# Patient Record
Sex: Female | Born: 1945 | Race: White | Hispanic: No | Marital: Single | Smoking: Never smoker
Health system: Southern US, Community
[De-identification: ages and names within clinical notes are randomized; demographics above are authoritative.]

## PROBLEM LIST (undated history)

## (undated) DIAGNOSIS — I10 Essential (primary) hypertension: Secondary | ICD-10-CM

## (undated) DIAGNOSIS — J189 Pneumonia, unspecified organism: Secondary | ICD-10-CM

## (undated) DIAGNOSIS — I482 Chronic atrial fibrillation, unspecified: Secondary | ICD-10-CM

## (undated) DIAGNOSIS — Q909 Down syndrome, unspecified: Secondary | ICD-10-CM

## (undated) DIAGNOSIS — I519 Heart disease, unspecified: Secondary | ICD-10-CM

## (undated) DIAGNOSIS — I2699 Other pulmonary embolism without acute cor pulmonale: Secondary | ICD-10-CM

## (undated) DIAGNOSIS — S8290XA Unspecified fracture of unspecified lower leg, initial encounter for closed fracture: Secondary | ICD-10-CM

## (undated) DIAGNOSIS — I272 Pulmonary hypertension, unspecified: Secondary | ICD-10-CM

## (undated) HISTORY — DX: Chronic atrial fibrillation, unspecified: I48.20

## (undated) HISTORY — DX: Heart disease, unspecified: I51.9

## (undated) HISTORY — DX: Essential (primary) hypertension: I10

---

## 2007-05-02 ENCOUNTER — Ambulatory Visit (HOSPITAL_COMMUNITY): Admission: RE | Admit: 2007-05-02 | Discharge: 2007-05-02 | Payer: Self-pay | Admitting: Family Medicine

## 2008-06-06 ENCOUNTER — Ambulatory Visit (HOSPITAL_COMMUNITY): Admission: RE | Admit: 2008-06-06 | Discharge: 2008-06-06 | Payer: Self-pay | Admitting: Family Medicine

## 2008-07-16 ENCOUNTER — Emergency Department (HOSPITAL_COMMUNITY): Admission: EM | Admit: 2008-07-16 | Discharge: 2008-07-16 | Payer: Self-pay | Admitting: Emergency Medicine

## 2008-07-16 ENCOUNTER — Encounter: Payer: Self-pay | Admitting: Orthopedic Surgery

## 2008-07-17 ENCOUNTER — Ambulatory Visit: Payer: Self-pay | Admitting: Orthopedic Surgery

## 2008-07-17 DIAGNOSIS — S82109A Unspecified fracture of upper end of unspecified tibia, initial encounter for closed fracture: Secondary | ICD-10-CM

## 2008-07-19 ENCOUNTER — Encounter: Payer: Self-pay | Admitting: Orthopedic Surgery

## 2008-08-01 ENCOUNTER — Ambulatory Visit: Payer: Self-pay | Admitting: Orthopedic Surgery

## 2008-08-29 ENCOUNTER — Ambulatory Visit: Payer: Self-pay | Admitting: Orthopedic Surgery

## 2008-10-10 ENCOUNTER — Ambulatory Visit: Payer: Self-pay | Admitting: Orthopedic Surgery

## 2008-10-23 ENCOUNTER — Telehealth: Payer: Self-pay | Admitting: Orthopedic Surgery

## 2008-11-07 ENCOUNTER — Encounter: Payer: Self-pay | Admitting: Orthopedic Surgery

## 2008-11-14 ENCOUNTER — Encounter: Payer: Self-pay | Admitting: Orthopedic Surgery

## 2008-11-22 ENCOUNTER — Encounter: Payer: Self-pay | Admitting: Orthopedic Surgery

## 2008-12-05 ENCOUNTER — Encounter: Payer: Self-pay | Admitting: Orthopedic Surgery

## 2008-12-10 ENCOUNTER — Encounter: Payer: Self-pay | Admitting: Orthopedic Surgery

## 2009-01-10 ENCOUNTER — Ambulatory Visit: Payer: Self-pay | Admitting: Orthopedic Surgery

## 2009-01-28 ENCOUNTER — Encounter: Payer: Self-pay | Admitting: Orthopedic Surgery

## 2009-02-01 ENCOUNTER — Encounter: Payer: Self-pay | Admitting: Orthopedic Surgery

## 2009-06-18 ENCOUNTER — Ambulatory Visit (HOSPITAL_COMMUNITY): Admission: RE | Admit: 2009-06-18 | Discharge: 2009-06-18 | Payer: Self-pay | Admitting: Family Medicine

## 2009-07-26 ENCOUNTER — Emergency Department (HOSPITAL_COMMUNITY): Admission: EM | Admit: 2009-07-26 | Discharge: 2009-07-26 | Payer: Self-pay | Admitting: Emergency Medicine

## 2009-07-28 ENCOUNTER — Emergency Department (HOSPITAL_COMMUNITY): Admission: EM | Admit: 2009-07-28 | Discharge: 2009-07-28 | Payer: Self-pay | Admitting: Emergency Medicine

## 2009-07-30 ENCOUNTER — Encounter: Payer: Self-pay | Admitting: Emergency Medicine

## 2009-07-31 ENCOUNTER — Inpatient Hospital Stay (HOSPITAL_COMMUNITY): Admission: EM | Admit: 2009-07-31 | Discharge: 2009-08-12 | Payer: Self-pay | Admitting: Internal Medicine

## 2009-07-31 ENCOUNTER — Encounter (INDEPENDENT_AMBULATORY_CARE_PROVIDER_SITE_OTHER): Payer: Self-pay | Admitting: Internal Medicine

## 2009-08-01 ENCOUNTER — Ambulatory Visit: Payer: Self-pay | Admitting: Vascular Surgery

## 2009-08-01 ENCOUNTER — Encounter (INDEPENDENT_AMBULATORY_CARE_PROVIDER_SITE_OTHER): Payer: Self-pay | Admitting: Cardiology

## 2010-06-27 ENCOUNTER — Ambulatory Visit (HOSPITAL_COMMUNITY): Admission: RE | Admit: 2010-06-27 | Discharge: 2010-06-27 | Payer: Self-pay | Admitting: Family Medicine

## 2011-02-27 LAB — COMPREHENSIVE METABOLIC PANEL
ALT: 80 U/L — ABNORMAL HIGH (ref 0–35)
ALT: 75 U/L — ABNORMAL HIGH (ref 0–35)
AST: 61 U/L — ABNORMAL HIGH (ref 0–37)
AST: 68 U/L — ABNORMAL HIGH (ref 0–37)
CO2: 23 mEq/L (ref 19–32)
CO2: 25 mEq/L (ref 19–32)
Calcium: 8.9 mg/dL (ref 8.4–10.5)
Calcium: 9.4 mg/dL (ref 8.4–10.5)
Calcium: 8.7 mg/dL (ref 8.4–10.5)
Creatinine, Ser: 0.7 mg/dL (ref 0.4–1.2)
Creatinine, Ser: 0.8 mg/dL (ref 0.4–1.2)
Creatinine, Ser: 0.83 mg/dL (ref 0.4–1.2)
GFR calc Af Amer: >60 mL/min (ref >60)
GFR calc Af Amer: >60 mL/min (ref >60)
GFR calc Af Amer: >60 mL/min (ref >60)
GFR calc non Af Amer: >60 mL/min (ref >60)
GFR calc non Af Amer: >60 mL/min (ref >60)
Glucose, Bld: 102 mg/dL — ABNORMAL HIGH (ref 70–99)
Sodium: 130 mEq/L — ABNORMAL LOW (ref 135–145)
Sodium: 132 mEq/L — ABNORMAL LOW (ref 135–145)
Total Protein: 6.8 g/dL (ref 6.0–8.3)
Total Protein: 7.5 g/dL (ref 6.0–8.3)
Total Protein: 7 g/dL (ref 6.0–8.3)

## 2011-02-27 LAB — POCT CARDIAC MARKERS
CKMB, poc: 5.1 ng/mL (ref 1.0–8.0)
Myoglobin, poc: 113 ng/mL (ref 12–200)

## 2011-02-27 LAB — CK TOTAL AND CKMB (NOT AT ARMC)
CK, MB: 5.9 ng/mL — ABNORMAL HIGH (ref 0.3–4.0)
CK, MB: 2.9 ng/mL (ref 0.3–4.0)
Relative Index: 5.6 — ABNORMAL HIGH (ref 0.0–2.5)
Total CK: 106 U/L (ref 7–177)
Total CK: 75 U/L (ref 7–177)

## 2011-02-27 LAB — BLOOD GAS, ARTERIAL
Acid-Base Excess: 0.2 mmol/L (ref 0.0–2.0)
FIO2: 50 %
TCO2: 20.6 mmol/L (ref 0–100)
pCO2 arterial: 33.2 mmHg — ABNORMAL LOW (ref 35.0–45.0)
pH, Arterial: 7.465 — ABNORMAL HIGH (ref 7.350–7.400)

## 2011-02-27 LAB — BASIC METABOLIC PANEL
BUN: 18 mg/dL (ref 6–23)
BUN: 23 mg/dL (ref 6–23)
BUN: 11 mg/dL (ref 6–23)
BUN: 15 mg/dL (ref 6–23)
BUN: 16 mg/dL (ref 6–23)
CO2: 24 mEq/L (ref 19–32)
CO2: 24 mEq/L (ref 19–32)
CO2: 28 mEq/L (ref 19–32)
CO2: 28 mEq/L (ref 19–32)
Calcium: 9.3 mg/dL (ref 8.4–10.5)
Calcium: 8.1 mg/dL — ABNORMAL LOW (ref 8.4–10.5)
Calcium: 9.3 mg/dL (ref 8.4–10.5)
Chloride: 93 mEq/L — ABNORMAL LOW (ref 96–112)
Chloride: 99 mEq/L (ref 96–112)
Chloride: 93 mEq/L — ABNORMAL LOW (ref 96–112)
Creatinine, Ser: 0.73 mg/dL (ref 0.4–1.2)
Creatinine, Ser: 0.75 mg/dL (ref 0.4–1.2)
Creatinine, Ser: 1.12 mg/dL (ref 0.4–1.2)
GFR calc Af Amer: >60 mL/min (ref >60)
GFR calc Af Amer: >60 mL/min (ref >60)
GFR calc non Af Amer: >60 mL/min (ref >60)
GFR calc non Af Amer: >60 mL/min (ref >60)
GFR calc non Af Amer: 49 mL/min — ABNORMAL LOW (ref >60)
GFR calc non Af Amer: >60 mL/min (ref >60)
Glucose, Bld: 105 mg/dL — ABNORMAL HIGH (ref 70–99)
Glucose, Bld: 89 mg/dL (ref 70–99)
Glucose, Bld: 102 mg/dL — ABNORMAL HIGH (ref 70–99)
Glucose, Bld: 87 mg/dL (ref 70–99)
Potassium: 4.3 mEq/L (ref 3.5–5.1)
Potassium: 4.6 mEq/L (ref 3.5–5.1)
Potassium: 5.2 mEq/L — ABNORMAL HIGH (ref 3.5–5.1)
Potassium: 3.5 mEq/L (ref 3.5–5.1)
Potassium: 3.8 mEq/L (ref 3.5–5.1)
Potassium: 4.7 mEq/L (ref 3.5–5.1)
Sodium: 130 mEq/L — ABNORMAL LOW (ref 135–145)
Sodium: 133 mEq/L — ABNORMAL LOW (ref 135–145)

## 2011-02-27 LAB — DIFFERENTIAL
Basophils Absolute: 0 10*3/uL (ref 0.0–0.1)
Basophils Relative: 0 % (ref 0–1)
Eosinophils Absolute: 0 10*3/uL (ref 0.0–0.7)
Eosinophils Relative: 0 % (ref 0–5)
Lymphocytes Relative: 7 % — ABNORMAL LOW (ref 12–46)
Metamyelocytes Relative: 0 %
Monocytes Absolute: 0.4 10*3/uL (ref 0.1–1.0)
Monocytes Relative: 4 % (ref 3–12)
Monocytes Relative: 7 % (ref 3–12)
Neutro Abs: 11.5 10*3/uL — ABNORMAL HIGH (ref 1.7–7.7)
Neutro Abs: 8.9 10*3/uL — ABNORMAL HIGH (ref 1.7–7.7)
Neutrophils Relative %: 85 % — ABNORMAL HIGH (ref 43–77)
nRBC: 0 /100 WBC

## 2011-02-27 LAB — CBC
HCT: 42.7 % (ref 36.0–46.0)
HCT: 43.6 % (ref 36.0–46.0)
HCT: 39.1 % (ref 36.0–46.0)
HCT: 38 % (ref 36.0–46.0)
HCT: 38.9 % (ref 36.0–46.0)
HCT: 43.8 % (ref 36.0–46.0)
HCT: 43.8 % (ref 36.0–46.0)
Hemoglobin: 14.2 g/dL (ref 12.0–15.0)
Hemoglobin: 14.5 g/dL (ref 12.0–15.0)
Hemoglobin: 14.6 g/dL (ref 12.0–15.0)
Hemoglobin: 12.9 g/dL (ref 12.0–15.0)
Hemoglobin: 12.8 g/dL (ref 12.0–15.0)
Hemoglobin: 12.8 g/dL (ref 12.0–15.0)
Hemoglobin: 14.4 g/dL (ref 12.0–15.0)
Hemoglobin: 14.5 g/dL (ref 12.0–15.0)
MCHC: 33.4 g/dL (ref 30.0–36.0)
MCHC: 33.4 g/dL (ref 30.0–36.0)
MCHC: 32.8 g/dL (ref 30.0–36.0)
MCHC: 32.8 g/dL (ref 30.0–36.0)
MCHC: 32.9 g/dL (ref 30.0–36.0)
MCHC: 32.9 g/dL (ref 30.0–36.0)
MCV: 90.2 fL (ref 78.0–100.0)
MCV: 90.2 fL (ref 78.0–100.0)
MCV: 91.7 fL (ref 78.0–100.0)
MCV: 92 fL (ref 78.0–100.0)
MCV: 91.6 fL (ref 78.0–100.0)
MCV: 92.2 fL (ref 78.0–100.0)
MCV: 92.4 fL (ref 78.0–100.0)
MCV: 92.7 fL (ref 78.0–100.0)
MCV: 92.8 fL (ref 78.0–100.0)
Platelets: 120 10*3/uL — ABNORMAL LOW (ref 150–400)
Platelets: 134 10*3/uL — ABNORMAL LOW (ref 150–400)
Platelets: 128 10*3/uL — ABNORMAL LOW (ref 150–400)
Platelets: 130 10*3/uL — ABNORMAL LOW (ref 150–400)
Platelets: 139 10*3/uL — ABNORMAL LOW (ref 150–400)
Platelets: 154 10*3/uL (ref 150–400)
RBC: 4.74 MIL/uL (ref 3.87–5.11)
RBC: 4.8 MIL/uL (ref 3.87–5.11)
RBC: 4.84 MIL/uL (ref 3.87–5.11)
RBC: 4.26 MIL/uL (ref 3.87–5.11)
RBC: 4.28 MIL/uL (ref 3.87–5.11)
RDW: 15.9 % — ABNORMAL HIGH (ref 11.5–15.5)
RDW: 16.8 % — ABNORMAL HIGH (ref 11.5–15.5)
RDW: 16.5 % — ABNORMAL HIGH (ref 11.5–15.5)
RDW: 17 % — ABNORMAL HIGH (ref 11.5–15.5)
RDW: 17.1 % — ABNORMAL HIGH (ref 11.5–15.5)
RDW: 17.5 % — ABNORMAL HIGH (ref 11.5–15.5)
RDW: 18 % — ABNORMAL HIGH (ref 11.5–15.5)
WBC: 6.5 10*3/uL (ref 4.0–10.5)
WBC: 6.6 10*3/uL (ref 4.0–10.5)
WBC: 6.8 10*3/uL (ref 4.0–10.5)
WBC: 13.3 10*3/uL — ABNORMAL HIGH (ref 4.0–10.5)
WBC: 7.4 10*3/uL (ref 4.0–10.5)

## 2011-02-27 LAB — POCT I-STAT, CHEM 8
BUN: 21 mg/dL (ref 6–23)
Creatinine, Ser: 0.7 mg/dL (ref 0.4–1.2)
Hemoglobin: 15.3 g/dL — ABNORMAL HIGH (ref 12.0–15.0)
Potassium: 4.3 mEq/L (ref 3.5–5.1)
Sodium: 128 mEq/L — ABNORMAL LOW (ref 135–145)

## 2011-02-27 LAB — URINE MICROSCOPIC-ADD ON

## 2011-02-27 LAB — DIGOXIN LEVEL: Digoxin Level: 1 ng/mL (ref 0.8–2.0)

## 2011-02-27 LAB — LIPID PANEL
Cholesterol: 77 mg/dL (ref 0–200)
LDL Cholesterol: 39 mg/dL (ref 0–99)
VLDL: 12 mg/dL (ref 0–40)

## 2011-02-27 LAB — PROTIME-INR
INR: 2 — ABNORMAL HIGH (ref 0.00–1.49)
INR: 2.3 — ABNORMAL HIGH (ref 0.00–1.49)
INR: 1.3 (ref 0.00–1.49)
INR: 1.3 (ref 0.00–1.49)
Prothrombin Time: 22.3 seconds — ABNORMAL HIGH (ref 11.6–15.2)
Prothrombin Time: 25.2 seconds — ABNORMAL HIGH (ref 11.6–15.2)
Prothrombin Time: 14.7 seconds (ref 11.6–15.2)
Prothrombin Time: 14.8 seconds (ref 11.6–15.2)
Prothrombin Time: 15.7 seconds — ABNORMAL HIGH (ref 11.6–15.2)

## 2011-02-27 LAB — URINALYSIS, ROUTINE W REFLEX MICROSCOPIC
Ketones, ur: NEGATIVE mg/dL
Leukocytes, UA: NEGATIVE
Nitrite: NEGATIVE
Protein, ur: 30 mg/dL — AB
Specific Gravity, Urine: >1.03 — ABNORMAL HIGH (ref 1.005–1.030)
Urobilinogen, UA: 0.2 mg/dL (ref 0.0–1.0)
Urobilinogen, UA: 0.2 mg/dL (ref 0.0–1.0)

## 2011-02-27 LAB — GLUCOSE, CAPILLARY: Glucose-Capillary: 92 mg/dL (ref 70–99)

## 2011-02-27 LAB — D-DIMER, QUANTITATIVE: D-Dimer, Quant: 1.55 ug/mL-FEU — ABNORMAL HIGH (ref 0.00–0.48)

## 2011-02-27 LAB — CARDIAC PANEL(CRET KIN+CKTOT+MB+TROPI)
CK, MB: 2.1 ng/mL (ref 0.3–4.0)
CK, MB: 3.9 ng/mL (ref 0.3–4.0)
Total CK: 100 U/L (ref 7–177)
Troponin I: 0.02 ng/mL (ref 0.00–0.06)

## 2011-02-27 LAB — HEPARIN LEVEL (UNFRACTIONATED): Heparin Unfractionated: 0.38 IU/mL (ref 0.30–0.70)

## 2011-07-01 ENCOUNTER — Other Ambulatory Visit (HOSPITAL_COMMUNITY): Payer: Self-pay | Admitting: Family Medicine

## 2011-07-01 DIAGNOSIS — Z139 Encounter for screening, unspecified: Secondary | ICD-10-CM

## 2011-08-03 ENCOUNTER — Ambulatory Visit (HOSPITAL_COMMUNITY)
Admission: RE | Admit: 2011-08-03 | Discharge: 2011-08-03 | Disposition: A | Discharge status: Home / Self Care | Payer: Medicare Other | Source: Ambulatory Visit | Attending: Family Medicine | Admitting: Family Medicine

## 2011-08-03 DIAGNOSIS — Z1231 Encounter for screening mammogram for malignant neoplasm of breast: Secondary | ICD-10-CM | POA: Insufficient documentation

## 2011-08-03 DIAGNOSIS — Z139 Encounter for screening, unspecified: Secondary | ICD-10-CM

## 2011-11-26 DIAGNOSIS — Z7901 Long term (current) use of anticoagulants: Secondary | ICD-10-CM | POA: Diagnosis not present

## 2011-12-16 DIAGNOSIS — M79609 Pain in unspecified limb: Secondary | ICD-10-CM | POA: Diagnosis not present

## 2011-12-16 DIAGNOSIS — B351 Tinea unguium: Secondary | ICD-10-CM | POA: Diagnosis not present

## 2011-12-28 DIAGNOSIS — Z7901 Long term (current) use of anticoagulants: Secondary | ICD-10-CM | POA: Diagnosis not present

## 2012-02-03 DIAGNOSIS — Z7901 Long term (current) use of anticoagulants: Secondary | ICD-10-CM | POA: Diagnosis not present

## 2012-02-05 ENCOUNTER — Encounter (HOSPITAL_COMMUNITY): Payer: Self-pay | Admitting: *Deleted

## 2012-02-05 ENCOUNTER — Emergency Department (HOSPITAL_COMMUNITY)
Admission: EM | Admit: 2012-02-05 | Discharge: 2012-02-05 | Disposition: A | Discharge status: Home / Self Care | Payer: Medicare Other | Attending: Emergency Medicine | Admitting: Emergency Medicine

## 2012-02-05 DIAGNOSIS — R6889 Other general symptoms and signs: Secondary | ICD-10-CM | POA: Diagnosis not present

## 2012-02-05 DIAGNOSIS — I4891 Unspecified atrial fibrillation: Secondary | ICD-10-CM | POA: Diagnosis not present

## 2012-02-05 DIAGNOSIS — Z86711 Personal history of pulmonary embolism: Secondary | ICD-10-CM | POA: Insufficient documentation

## 2012-02-05 DIAGNOSIS — R197 Diarrhea, unspecified: Secondary | ICD-10-CM | POA: Diagnosis not present

## 2012-02-05 DIAGNOSIS — Z7901 Long term (current) use of anticoagulants: Secondary | ICD-10-CM | POA: Diagnosis not present

## 2012-02-05 DIAGNOSIS — I10 Essential (primary) hypertension: Secondary | ICD-10-CM | POA: Diagnosis not present

## 2012-02-05 DIAGNOSIS — Z79899 Other long term (current) drug therapy: Secondary | ICD-10-CM | POA: Insufficient documentation

## 2012-02-05 DIAGNOSIS — R112 Nausea with vomiting, unspecified: Secondary | ICD-10-CM | POA: Insufficient documentation

## 2012-02-05 DIAGNOSIS — I509 Heart failure, unspecified: Secondary | ICD-10-CM | POA: Insufficient documentation

## 2012-02-05 DIAGNOSIS — Q909 Down syndrome, unspecified: Secondary | ICD-10-CM | POA: Insufficient documentation

## 2012-02-05 HISTORY — DX: Unspecified fracture of unspecified lower leg, initial encounter for closed fracture: S82.90XA

## 2012-02-05 HISTORY — DX: Down syndrome, unspecified: Q90.9

## 2012-02-05 HISTORY — DX: Pneumonia, unspecified organism: J18.9

## 2012-02-05 HISTORY — DX: Other pulmonary embolism without acute cor pulmonale: I26.99

## 2012-02-05 MED ORDER — DIPHENOXYLATE-ATROPINE 2.5-0.025 MG PO TABS: 1.0000 | ORAL_TABLET | Freq: Four times a day (QID) | ORAL | Status: AC | PRN

## 2012-02-05 MED ORDER — SODIUM CHLORIDE 0.9 % IV SOLN
1000.0000 mL | Freq: Once | INTRAVENOUS | Status: AC
  Administered 2012-02-05: 1000 mL via INTRAVENOUS

## 2012-02-05 MED ORDER — PROMETHAZINE HCL 25 MG/ML IJ SOLN
INTRAMUSCULAR | Status: AC
  Filled 2012-02-05: qty 1

## 2012-02-05 MED ORDER — SODIUM CHLORIDE 0.9 % IV SOLN
1000.0000 mL | INTRAVENOUS | Status: DC
  Administered 2012-02-05: 1000 mL via INTRAVENOUS

## 2012-02-05 MED ORDER — PROMETHAZINE HCL 25 MG/ML IJ SOLN
12.5000 mg | Freq: Once | INTRAMUSCULAR | Status: AC
  Administered 2012-02-05: 12.5 mg via INTRAVENOUS

## 2012-02-05 MED ORDER — PREDNISONE 20 MG PO TABS: 60.0000 mg | ORAL_TABLET | Freq: Once | ORAL | Status: DC

## 2012-02-05 MED ORDER — ONDANSETRON 4 MG PO TBDP: 4.0000 mg | ORAL_TABLET | Freq: Three times a day (TID) | ORAL | Status: AC | PRN

## 2012-02-05 MED ORDER — DIPHENOXYLATE-ATROPINE 2.5-0.025 MG PO TABS: 2.0000 | ORAL_TABLET | Freq: Once | ORAL | Status: DC

## 2012-02-05 MED ORDER — DIPHENOXYLATE-ATROPINE 2.5-0.025 MG PO TABS
2.0000 | ORAL_TABLET | Freq: Once | ORAL | Status: AC
  Administered 2012-02-05: 2 via ORAL
  Filled 2012-02-05: qty 2

## 2012-02-05 MED ORDER — MORPHINE SULFATE 4 MG/ML IJ SOLN
2.0000 mg | Freq: Once | INTRAMUSCULAR | Status: AC
  Administered 2012-02-05: 2 mg via INTRAVENOUS
  Filled 2012-02-05: qty 1

## 2012-02-05 MED ORDER — ONDANSETRON HCL 4 MG/2ML IJ SOLN
4.0000 mg | Freq: Once | INTRAMUSCULAR | Status: AC
  Administered 2012-02-05: 4 mg via INTRAVENOUS
  Filled 2012-02-05: qty 2

## 2012-02-05 NOTE — ED Notes (Signed)
Pt from [facility]. Pt reported to having vomiting & diarrhea. Pt vomiting when came into the er.

## 2012-02-05 NOTE — ED Notes (Signed)
Pt vomited again & went back to sleep. Cleaned pt, changed sheets & gown. EDP notified. Pt assisted to bedside commode by female nurses.

## 2012-02-05 NOTE — ED Notes (Signed)
Pt has vomited x2 edp notified.

## 2012-02-05 NOTE — ED Notes (Signed)
[facility] called & spoke to [facility] advised pt is ready to be picked up from the er. She advised would be a little after 0700 before they could get someone to pick her up.

## 2012-02-05 NOTE — ED Provider Notes (Signed)
History     CSN: [id number]  Arrival date & time 02/05/12  0134   First MD Initiated Contact with Patient 02/05/12 0145      Chief Complaint  Patient presents with  . Diarrhea  . Emesis    (Consider location/radiation/quality/duration/timing/severity/associated sxs/prior treatment) HPI Angel Torres is a 66 y.o. female with a history of Down's syndrome, hypertension, congestive heart failure, atrial fibrillation, pulmonary embolism on Coumadin therapy, brought in by ambulance, who presents to the Emergency Department complaining of nausea vomiting and diarrhea that began today. The patient is a resident at [facility] long-term care facility where there have been several patients with similar illness. She has not been given any medicines. She denies fever, chills, chest pain, shortness of breath.  PCP  [facility] Past Medical History  Diagnosis Date  . Down syndrome   . Hypertension   . CHF (congestive heart failure)   . Atrial fibrillation   . Pneumonia   . Pulmonary embolism   . Leg fracture     No past surgical history on file.  No family history on file.  History  Substance Use Topics  . Smoking status: Not on file  . Smokeless tobacco: Not on file  . Alcohol Use: No    OB History    Grav Para Term Preterm Abortions TAB SAB Ect Mult Living                  Review of Systems ROS: Statement: All systems negative except as marked or noted in the HPI; Constitutional: Negative for fever and chills. ; ; Eyes: Negative for eye pain, redness and discharge. ; ; ENMT: Negative for ear pain, hoarseness, nasal congestion, sinus pressure and sore throat. ; ; Cardiovascular: Negative for chest pain, palpitations, diaphoresis, dyspnea and peripheral edema. ; ; Respiratory: Negative for cough, wheezing and stridor. ; ; Gastrointestinal: Negative for  abdominal pain, blood in stool, hematemesis, jaundice and rectal bleeding. . ; ; Genitourinary: Negative for  dysuria, flank pain and hematuria. ; ; Musculoskeletal: Negative for back pain and neck pain. Negative for swelling and trauma.; ; Skin: Negative for pruritus, rash, abrasions, blisters, bruising and skin lesion.; ; Neuro: Negative for headache, lightheadedness and neck stiffness. Negative for weakness, altered level of consciousness , altered mental status, extremity weakness, paresthesias, involuntary movement, seizure and syncope.     Allergies  Review of patient's allergies indicates no known allergies.  Home Medications   Current Outpatient Rx  Name Route Sig Dispense Refill  . DIGOXIN 0.25 MG PO TABS Oral Take 250 mcg by mouth daily.    Marland Kitchen DILTIAZEM HCL 120 MG PO TABS Oral Take 120 mg by mouth 3 (three) times daily.    . FUROSEMIDE 40 MG PO TABS Oral Take 40 mg by mouth 2 (two) times daily.    Marland Kitchen HYDROXYZINE HCL 25 MG PO TABS Oral Take 25 mg by mouth every 6 (six) hours as needed.    Marland Kitchen LISINOPRIL 5 MG PO TABS Oral Take 5 mg by mouth daily.    Marland Kitchen METOPROLOL TARTRATE 25 MG PO TABS Oral Take 25 mg by mouth 2 (two) times daily.    . WARFARIN SODIUM 5 MG PO TABS Oral Take 5 mg by mouth daily.      BP 135/57  Pulse 85  Temp(Src) 98.2 F (36.8 C) (Oral)  Resp 16  Ht 5\' 6"  (1.676 m)  Wt 160 lb (72.576 kg)  BMI 25.82 kg/m2  SpO2 96%  Physical Exam  ED Course  Procedures (including critical care time)  [phone] Patient has had three vomiting episodes since arrival in the ER. She has had diarrhea x 2.    MDM  Nursing home patient here with nausea, vomiting and diarrhea that began today. Given IV fluids, antiemetic analgesic, with improvement. Patient able to take by mouth fluids. Pt feels improved after observation and/or treatment in ED.Pt stable in ED with no significant deterioration in condition.The patient appears reasonably screened and/or stabilized for discharge and I doubt any other medical condition or other [facility] requiring further screening, evaluation, or treatment in the ED at  this time prior to discharge.  MDM Reviewed: nursing note and vitals           Nicoletta Dress. Colon Branch, MD 02/05/12 [phone]

## 2012-02-05 NOTE — ED Notes (Signed)
Pt had another loose stool. Pt cleaned & changed. Female nurses in the room at this time assisting.

## 2012-02-05 NOTE — ED Notes (Signed)
Pt had another loose stool. Pt cleaned & changed. Female nurse in room assisting. EDP notified of additional loose stools.

## 2012-02-05 NOTE — Discharge Instructions (Signed)
Drink lots of fluids.  Bland diet for the next 6-8 hours then progress as you can tolerate. Use the nausea and diarrhea  medicine as needed. Use BRAT for diarrhea. Follow up with your doctor.   B.R.A.T. Diet Your doctor has recommended the B.R.A.T. diet for you or your child until the condition improves. This is often used to help control diarrhea and vomiting symptoms. If you or your child can tolerate clear liquids, you may have:  Bananas.   Rice.   Applesauce.   Toast (and other simple starches such as crackers, potatoes, noodles).  Be sure to avoid dairy products, meats, and fatty foods until symptoms are better. Fruit juices such as apple, grape, and prune juice can make diarrhea worse. Avoid these. Continue this diet for 2 days or as instructed by your caregiver. Document Released: 11/09/2005 Document Revised: 10/29/2011 Document Reviewed: 04/28/2007 [facility] Patient Information 2012 [place], [place].

## 2012-02-05 NOTE — ED Notes (Signed)
Pt resting calmly w/ eyes closed. Rise & fall of the chest noted. Bed in low position, side rails up x2. NAD noted & no active vomiting at this time.

## 2012-02-24 DIAGNOSIS — M79609 Pain in unspecified limb: Secondary | ICD-10-CM | POA: Diagnosis not present

## 2012-02-24 DIAGNOSIS — B351 Tinea unguium: Secondary | ICD-10-CM | POA: Diagnosis not present

## 2012-03-04 DIAGNOSIS — Z7901 Long term (current) use of anticoagulants: Secondary | ICD-10-CM | POA: Diagnosis not present

## 2012-04-04 DIAGNOSIS — Z7901 Long term (current) use of anticoagulants: Secondary | ICD-10-CM | POA: Diagnosis not present

## 2012-05-03 DIAGNOSIS — M79609 Pain in unspecified limb: Secondary | ICD-10-CM | POA: Diagnosis not present

## 2012-05-03 DIAGNOSIS — B351 Tinea unguium: Secondary | ICD-10-CM | POA: Diagnosis not present

## 2012-05-05 DIAGNOSIS — Z79899 Other long term (current) drug therapy: Secondary | ICD-10-CM | POA: Diagnosis not present

## 2012-05-05 DIAGNOSIS — I4891 Unspecified atrial fibrillation: Secondary | ICD-10-CM | POA: Diagnosis not present

## 2012-05-05 DIAGNOSIS — I10 Essential (primary) hypertension: Secondary | ICD-10-CM | POA: Diagnosis not present

## 2012-05-05 DIAGNOSIS — Z6826 Body mass index (BMI) 26.0-26.9, adult: Secondary | ICD-10-CM | POA: Diagnosis not present

## 2012-05-06 DIAGNOSIS — Z7901 Long term (current) use of anticoagulants: Secondary | ICD-10-CM | POA: Diagnosis not present

## 2012-06-03 DIAGNOSIS — Z7901 Long term (current) use of anticoagulants: Secondary | ICD-10-CM | POA: Diagnosis not present

## 2012-07-04 ENCOUNTER — Other Ambulatory Visit (HOSPITAL_COMMUNITY): Payer: Self-pay | Admitting: Family Medicine

## 2012-07-04 DIAGNOSIS — Z7901 Long term (current) use of anticoagulants: Secondary | ICD-10-CM | POA: Diagnosis not present

## 2012-07-04 DIAGNOSIS — Z139 Encounter for screening, unspecified: Secondary | ICD-10-CM

## 2012-07-05 DIAGNOSIS — B351 Tinea unguium: Secondary | ICD-10-CM | POA: Diagnosis not present

## 2012-07-05 DIAGNOSIS — M79609 Pain in unspecified limb: Secondary | ICD-10-CM | POA: Diagnosis not present

## 2012-07-12 DIAGNOSIS — E785 Hyperlipidemia, unspecified: Secondary | ICD-10-CM | POA: Diagnosis not present

## 2012-08-03 DIAGNOSIS — E119 Type 2 diabetes mellitus without complications: Secondary | ICD-10-CM | POA: Diagnosis not present

## 2012-08-03 DIAGNOSIS — H5200 Hypermetropia, unspecified eye: Secondary | ICD-10-CM | POA: Diagnosis not present

## 2012-08-03 DIAGNOSIS — H2589 Other age-related cataract: Secondary | ICD-10-CM | POA: Diagnosis not present

## 2012-08-03 DIAGNOSIS — H52229 Regular astigmatism, unspecified eye: Secondary | ICD-10-CM | POA: Diagnosis not present

## 2012-08-05 DIAGNOSIS — Z7901 Long term (current) use of anticoagulants: Secondary | ICD-10-CM | POA: Diagnosis not present

## 2012-08-22 ENCOUNTER — Ambulatory Visit (HOSPITAL_COMMUNITY)
Admission: RE | Admit: 2012-08-22 | Discharge: 2012-08-22 | Disposition: A | Discharge status: Home / Self Care | Payer: Medicare Other | Source: Ambulatory Visit | Attending: Family Medicine | Admitting: Family Medicine

## 2012-08-22 DIAGNOSIS — Z139 Encounter for screening, unspecified: Secondary | ICD-10-CM

## 2012-08-22 DIAGNOSIS — Z1231 Encounter for screening mammogram for malignant neoplasm of breast: Secondary | ICD-10-CM | POA: Insufficient documentation

## 2012-09-09 DIAGNOSIS — Z7901 Long term (current) use of anticoagulants: Secondary | ICD-10-CM | POA: Diagnosis not present

## 2012-09-15 DIAGNOSIS — B351 Tinea unguium: Secondary | ICD-10-CM | POA: Diagnosis not present

## 2012-09-15 DIAGNOSIS — M79609 Pain in unspecified limb: Secondary | ICD-10-CM | POA: Diagnosis not present

## 2012-09-16 DIAGNOSIS — Z23 Encounter for immunization: Secondary | ICD-10-CM | POA: Diagnosis not present

## 2012-10-10 DIAGNOSIS — Z7901 Long term (current) use of anticoagulants: Secondary | ICD-10-CM | POA: Diagnosis not present

## 2012-11-09 DIAGNOSIS — Z7901 Long term (current) use of anticoagulants: Secondary | ICD-10-CM | POA: Diagnosis not present

## 2012-12-01 DIAGNOSIS — M79609 Pain in unspecified limb: Secondary | ICD-10-CM | POA: Diagnosis not present

## 2012-12-01 DIAGNOSIS — B351 Tinea unguium: Secondary | ICD-10-CM | POA: Diagnosis not present

## 2012-12-09 DIAGNOSIS — Z7901 Long term (current) use of anticoagulants: Secondary | ICD-10-CM | POA: Diagnosis not present

## 2013-01-09 DIAGNOSIS — Z7901 Long term (current) use of anticoagulants: Secondary | ICD-10-CM | POA: Diagnosis not present

## 2013-01-16 DIAGNOSIS — Z7901 Long term (current) use of anticoagulants: Secondary | ICD-10-CM | POA: Diagnosis not present

## 2013-01-30 DIAGNOSIS — Z7901 Long term (current) use of anticoagulants: Secondary | ICD-10-CM | POA: Diagnosis not present

## 2013-02-06 DIAGNOSIS — Z7901 Long term (current) use of anticoagulants: Secondary | ICD-10-CM | POA: Diagnosis not present

## 2013-02-13 DIAGNOSIS — B351 Tinea unguium: Secondary | ICD-10-CM | POA: Diagnosis not present

## 2013-02-13 DIAGNOSIS — M79609 Pain in unspecified limb: Secondary | ICD-10-CM | POA: Diagnosis not present

## 2013-03-08 DIAGNOSIS — Z6827 Body mass index (BMI) 27.0-27.9, adult: Secondary | ICD-10-CM | POA: Diagnosis not present

## 2013-03-08 DIAGNOSIS — I959 Hypotension, unspecified: Secondary | ICD-10-CM | POA: Diagnosis not present

## 2013-03-08 DIAGNOSIS — I10 Essential (primary) hypertension: Secondary | ICD-10-CM | POA: Diagnosis not present

## 2013-03-08 DIAGNOSIS — Z0000 Encounter for general adult medical examination without abnormal findings: Secondary | ICD-10-CM | POA: Diagnosis not present

## 2013-03-08 DIAGNOSIS — Z79899 Other long term (current) drug therapy: Secondary | ICD-10-CM | POA: Diagnosis not present

## 2013-03-09 DIAGNOSIS — Z7901 Long term (current) use of anticoagulants: Secondary | ICD-10-CM | POA: Diagnosis not present

## 2013-03-16 ENCOUNTER — Other Ambulatory Visit (HOSPITAL_COMMUNITY): Payer: Self-pay | Admitting: Internal Medicine

## 2013-03-16 DIAGNOSIS — M810 Age-related osteoporosis without current pathological fracture: Secondary | ICD-10-CM

## 2013-03-17 ENCOUNTER — Ambulatory Visit (HOSPITAL_COMMUNITY)
Admission: RE | Admit: 2013-03-17 | Discharge: 2013-03-17 | Disposition: A | Discharge status: Home / Self Care | Payer: Medicare Other | Source: Ambulatory Visit | Attending: Internal Medicine | Admitting: Internal Medicine

## 2013-03-17 DIAGNOSIS — M810 Age-related osteoporosis without current pathological fracture: Secondary | ICD-10-CM | POA: Diagnosis not present

## 2013-04-05 ENCOUNTER — Telehealth: Payer: Self-pay

## 2013-04-05 NOTE — Telephone Encounter (Signed)
Called [facility] to speak with Lupita Leash, the nurse. She is off today. ( Pt was referred by Dr. Regino Schultze for screening colonoscopy).

## 2013-04-07 DIAGNOSIS — Z7901 Long term (current) use of anticoagulants: Secondary | ICD-10-CM | POA: Diagnosis not present

## 2013-04-19 NOTE — Telephone Encounter (Signed)
Called to speak to Lupita Leash, the nurse. She is out today but should be there tomorrow.

## 2013-04-20 NOTE — Telephone Encounter (Signed)
LM for a return call from [facility].

## 2013-04-25 NOTE — Telephone Encounter (Signed)
Called and was holding for Lupita Leash the nurse and got disconnected.

## 2013-04-26 NOTE — Telephone Encounter (Signed)
Called and spoke to Kelford at [facility]. She said that pt is on coumadin. Ov scheduled  with Tana Coast, PA on 05/17/2013 at 10:00 AM.

## 2013-05-03 DIAGNOSIS — B351 Tinea unguium: Secondary | ICD-10-CM | POA: Diagnosis not present

## 2013-05-03 DIAGNOSIS — M79609 Pain in unspecified limb: Secondary | ICD-10-CM | POA: Diagnosis not present

## 2013-05-10 DIAGNOSIS — Z7901 Long term (current) use of anticoagulants: Secondary | ICD-10-CM | POA: Diagnosis not present

## 2013-05-15 ENCOUNTER — Telehealth: Payer: Self-pay

## 2013-05-15 NOTE — Telephone Encounter (Signed)
Lupita Leash from [facility] called to let you know that the family of patient does not want patient going through with procedure (tcs) at this time. She asked that I cancel the OV for 6/25 and to let you be aware of family's wishes.

## 2013-05-15 NOTE — Telephone Encounter (Signed)
I am sending Dr. Regino Schultze a message also.

## 2013-05-17 ENCOUNTER — Ambulatory Visit: Payer: Medicare Other | Admitting: Gastroenterology

## 2013-06-08 DIAGNOSIS — Z7901 Long term (current) use of anticoagulants: Secondary | ICD-10-CM | POA: Diagnosis not present

## 2013-06-12 DIAGNOSIS — Z7901 Long term (current) use of anticoagulants: Secondary | ICD-10-CM | POA: Diagnosis not present

## 2013-07-04 DIAGNOSIS — Z7901 Long term (current) use of anticoagulants: Secondary | ICD-10-CM | POA: Diagnosis not present

## 2013-07-04 DIAGNOSIS — I4891 Unspecified atrial fibrillation: Secondary | ICD-10-CM | POA: Diagnosis not present

## 2013-07-13 DIAGNOSIS — Z7901 Long term (current) use of anticoagulants: Secondary | ICD-10-CM | POA: Diagnosis not present

## 2013-07-19 DIAGNOSIS — B351 Tinea unguium: Secondary | ICD-10-CM | POA: Diagnosis not present

## 2013-07-19 DIAGNOSIS — Z6825 Body mass index (BMI) 25.0-25.9, adult: Secondary | ICD-10-CM | POA: Diagnosis not present

## 2013-07-19 DIAGNOSIS — M79609 Pain in unspecified limb: Secondary | ICD-10-CM | POA: Diagnosis not present

## 2013-07-19 DIAGNOSIS — Z6828 Body mass index (BMI) 28.0-28.9, adult: Secondary | ICD-10-CM | POA: Diagnosis not present

## 2013-07-19 DIAGNOSIS — I776 Arteritis, unspecified: Secondary | ICD-10-CM | POA: Diagnosis not present

## 2013-07-28 DIAGNOSIS — Z6829 Body mass index (BMI) 29.0-29.9, adult: Secondary | ICD-10-CM | POA: Diagnosis not present

## 2013-07-28 DIAGNOSIS — L259 Unspecified contact dermatitis, unspecified cause: Secondary | ICD-10-CM | POA: Diagnosis not present

## 2013-08-08 DIAGNOSIS — H5200 Hypermetropia, unspecified eye: Secondary | ICD-10-CM | POA: Diagnosis not present

## 2013-08-08 DIAGNOSIS — H2589 Other age-related cataract: Secondary | ICD-10-CM | POA: Diagnosis not present

## 2013-08-08 DIAGNOSIS — H52229 Regular astigmatism, unspecified eye: Secondary | ICD-10-CM | POA: Diagnosis not present

## 2013-08-08 DIAGNOSIS — E119 Type 2 diabetes mellitus without complications: Secondary | ICD-10-CM | POA: Diagnosis not present

## 2013-08-10 DIAGNOSIS — Z7901 Long term (current) use of anticoagulants: Secondary | ICD-10-CM | POA: Diagnosis not present

## 2013-08-21 DIAGNOSIS — Z23 Encounter for immunization: Secondary | ICD-10-CM | POA: Diagnosis not present

## 2013-09-07 DIAGNOSIS — Z7901 Long term (current) use of anticoagulants: Secondary | ICD-10-CM | POA: Diagnosis not present

## 2013-09-22 DIAGNOSIS — I2699 Other pulmonary embolism without acute cor pulmonale: Secondary | ICD-10-CM | POA: Diagnosis not present

## 2013-09-22 DIAGNOSIS — Z79899 Other long term (current) drug therapy: Secondary | ICD-10-CM | POA: Diagnosis not present

## 2013-09-22 DIAGNOSIS — Z7901 Long term (current) use of anticoagulants: Secondary | ICD-10-CM | POA: Diagnosis not present

## 2013-10-05 DIAGNOSIS — B351 Tinea unguium: Secondary | ICD-10-CM | POA: Diagnosis not present

## 2013-10-05 DIAGNOSIS — M79609 Pain in unspecified limb: Secondary | ICD-10-CM | POA: Diagnosis not present

## 2013-10-09 ENCOUNTER — Other Ambulatory Visit (HOSPITAL_COMMUNITY): Payer: Self-pay | Admitting: Family Medicine

## 2013-10-09 DIAGNOSIS — Z139 Encounter for screening, unspecified: Secondary | ICD-10-CM

## 2013-10-18 DIAGNOSIS — Z7901 Long term (current) use of anticoagulants: Secondary | ICD-10-CM | POA: Diagnosis not present

## 2013-10-27 DIAGNOSIS — Z7901 Long term (current) use of anticoagulants: Secondary | ICD-10-CM | POA: Diagnosis not present

## 2013-11-09 ENCOUNTER — Ambulatory Visit (HOSPITAL_COMMUNITY)
Admission: RE | Admit: 2013-11-09 | Discharge: 2013-11-09 | Disposition: A | Discharge status: Home / Self Care | Payer: Medicare Other | Source: Ambulatory Visit | Attending: Family Medicine | Admitting: Family Medicine

## 2013-11-09 DIAGNOSIS — Z1231 Encounter for screening mammogram for malignant neoplasm of breast: Secondary | ICD-10-CM | POA: Insufficient documentation

## 2013-11-09 DIAGNOSIS — Z139 Encounter for screening, unspecified: Secondary | ICD-10-CM

## 2013-11-27 DIAGNOSIS — Z7901 Long term (current) use of anticoagulants: Secondary | ICD-10-CM | POA: Diagnosis not present

## 2013-12-28 DIAGNOSIS — Z7901 Long term (current) use of anticoagulants: Secondary | ICD-10-CM | POA: Diagnosis not present

## 2014-01-10 DIAGNOSIS — B351 Tinea unguium: Secondary | ICD-10-CM | POA: Diagnosis not present

## 2014-01-10 DIAGNOSIS — M79609 Pain in unspecified limb: Secondary | ICD-10-CM | POA: Diagnosis not present

## 2014-01-25 DIAGNOSIS — Z7901 Long term (current) use of anticoagulants: Secondary | ICD-10-CM | POA: Diagnosis not present

## 2014-03-08 DIAGNOSIS — E119 Type 2 diabetes mellitus without complications: Secondary | ICD-10-CM | POA: Diagnosis not present

## 2014-04-05 DIAGNOSIS — Z7901 Long term (current) use of anticoagulants: Secondary | ICD-10-CM | POA: Diagnosis not present

## 2014-04-10 DIAGNOSIS — M79609 Pain in unspecified limb: Secondary | ICD-10-CM | POA: Diagnosis not present

## 2014-04-10 DIAGNOSIS — B351 Tinea unguium: Secondary | ICD-10-CM | POA: Diagnosis not present

## 2014-04-17 ENCOUNTER — Other Ambulatory Visit (HOSPITAL_COMMUNITY): Payer: Self-pay | Admitting: Family Medicine

## 2014-04-17 ENCOUNTER — Ambulatory Visit (HOSPITAL_COMMUNITY)
Admission: RE | Admit: 2014-04-17 | Discharge: 2014-04-17 | Disposition: A | Discharge status: Home / Self Care | Payer: Medicare Other | Source: Ambulatory Visit | Attending: Family Medicine | Admitting: Family Medicine

## 2014-04-17 DIAGNOSIS — J438 Other emphysema: Secondary | ICD-10-CM | POA: Insufficient documentation

## 2014-04-17 DIAGNOSIS — Z6827 Body mass index (BMI) 27.0-27.9, adult: Secondary | ICD-10-CM | POA: Diagnosis not present

## 2014-04-17 DIAGNOSIS — Z7901 Long term (current) use of anticoagulants: Secondary | ICD-10-CM | POA: Diagnosis not present

## 2014-04-17 DIAGNOSIS — E119 Type 2 diabetes mellitus without complications: Secondary | ICD-10-CM | POA: Diagnosis not present

## 2014-04-17 DIAGNOSIS — R0602 Shortness of breath: Secondary | ICD-10-CM | POA: Diagnosis not present

## 2014-04-17 DIAGNOSIS — I10 Essential (primary) hypertension: Secondary | ICD-10-CM | POA: Diagnosis not present

## 2014-04-17 DIAGNOSIS — I517 Cardiomegaly: Secondary | ICD-10-CM | POA: Diagnosis not present

## 2014-04-17 DIAGNOSIS — R233 Spontaneous ecchymoses: Secondary | ICD-10-CM | POA: Diagnosis not present

## 2014-05-08 DIAGNOSIS — Z7901 Long term (current) use of anticoagulants: Secondary | ICD-10-CM | POA: Diagnosis not present

## 2014-06-06 DIAGNOSIS — Z7901 Long term (current) use of anticoagulants: Secondary | ICD-10-CM | POA: Diagnosis not present

## 2014-06-13 DIAGNOSIS — M79609 Pain in unspecified limb: Secondary | ICD-10-CM | POA: Diagnosis not present

## 2014-06-13 DIAGNOSIS — B351 Tinea unguium: Secondary | ICD-10-CM | POA: Diagnosis not present

## 2014-07-09 DIAGNOSIS — Z7901 Long term (current) use of anticoagulants: Secondary | ICD-10-CM | POA: Diagnosis not present

## 2014-08-08 DIAGNOSIS — Z7901 Long term (current) use of anticoagulants: Secondary | ICD-10-CM | POA: Diagnosis not present

## 2014-08-22 DIAGNOSIS — H25019 Cortical age-related cataract, unspecified eye: Secondary | ICD-10-CM | POA: Diagnosis not present

## 2014-09-04 DIAGNOSIS — Z23 Encounter for immunization: Secondary | ICD-10-CM | POA: Diagnosis not present

## 2014-09-06 DIAGNOSIS — Z7901 Long term (current) use of anticoagulants: Secondary | ICD-10-CM | POA: Diagnosis not present

## 2014-09-10 DIAGNOSIS — M79671 Pain in right foot: Secondary | ICD-10-CM | POA: Diagnosis not present

## 2014-09-10 DIAGNOSIS — B351 Tinea unguium: Secondary | ICD-10-CM | POA: Diagnosis not present

## 2014-09-10 DIAGNOSIS — M79676 Pain in unspecified toe(s): Secondary | ICD-10-CM | POA: Diagnosis not present

## 2014-09-10 DIAGNOSIS — M79672 Pain in left foot: Secondary | ICD-10-CM | POA: Diagnosis not present

## 2014-09-14 DIAGNOSIS — Z0000 Encounter for general adult medical examination without abnormal findings: Secondary | ICD-10-CM | POA: Diagnosis not present

## 2014-09-14 DIAGNOSIS — Z6828 Body mass index (BMI) 28.0-28.9, adult: Secondary | ICD-10-CM | POA: Diagnosis not present

## 2014-09-14 DIAGNOSIS — E119 Type 2 diabetes mellitus without complications: Secondary | ICD-10-CM | POA: Diagnosis not present

## 2014-10-08 DIAGNOSIS — Z7901 Long term (current) use of anticoagulants: Secondary | ICD-10-CM | POA: Diagnosis not present

## 2014-10-17 DIAGNOSIS — Z0000 Encounter for general adult medical examination without abnormal findings: Secondary | ICD-10-CM | POA: Diagnosis not present

## 2014-10-17 DIAGNOSIS — Z6828 Body mass index (BMI) 28.0-28.9, adult: Secondary | ICD-10-CM | POA: Diagnosis not present

## 2014-10-17 DIAGNOSIS — I10 Essential (primary) hypertension: Secondary | ICD-10-CM | POA: Diagnosis not present

## 2014-10-17 DIAGNOSIS — E119 Type 2 diabetes mellitus without complications: Secondary | ICD-10-CM | POA: Diagnosis not present

## 2014-11-07 DIAGNOSIS — Z7901 Long term (current) use of anticoagulants: Secondary | ICD-10-CM | POA: Diagnosis not present

## 2014-11-26 DIAGNOSIS — L03119 Cellulitis of unspecified part of limb: Secondary | ICD-10-CM | POA: Diagnosis not present

## 2014-11-26 DIAGNOSIS — Z6829 Body mass index (BMI) 29.0-29.9, adult: Secondary | ICD-10-CM | POA: Diagnosis not present

## 2014-11-26 DIAGNOSIS — R600 Localized edema: Secondary | ICD-10-CM | POA: Diagnosis not present

## 2014-11-26 DIAGNOSIS — E663 Overweight: Secondary | ICD-10-CM | POA: Diagnosis not present

## 2014-12-10 DIAGNOSIS — Z7901 Long term (current) use of anticoagulants: Secondary | ICD-10-CM | POA: Diagnosis not present

## 2014-12-17 DIAGNOSIS — Z7901 Long term (current) use of anticoagulants: Secondary | ICD-10-CM | POA: Diagnosis not present

## 2014-12-25 DIAGNOSIS — B351 Tinea unguium: Secondary | ICD-10-CM | POA: Diagnosis not present

## 2014-12-25 DIAGNOSIS — M79675 Pain in left toe(s): Secondary | ICD-10-CM | POA: Diagnosis not present

## 2014-12-28 ENCOUNTER — Other Ambulatory Visit (HOSPITAL_COMMUNITY): Payer: Self-pay | Admitting: Physician Assistant

## 2014-12-28 DIAGNOSIS — Z1231 Encounter for screening mammogram for malignant neoplasm of breast: Secondary | ICD-10-CM

## 2015-01-14 ENCOUNTER — Ambulatory Visit (HOSPITAL_COMMUNITY)
Admission: RE | Admit: 2015-01-14 | Discharge: 2015-01-14 | Disposition: A | Discharge status: Home / Self Care | Payer: Medicare Other | Source: Ambulatory Visit | Attending: Physician Assistant | Admitting: Physician Assistant

## 2015-01-14 DIAGNOSIS — Z1231 Encounter for screening mammogram for malignant neoplasm of breast: Secondary | ICD-10-CM

## 2015-01-16 DIAGNOSIS — Z681 Body mass index (BMI) 19 or less, adult: Secondary | ICD-10-CM | POA: Diagnosis not present

## 2015-01-16 DIAGNOSIS — R21 Rash and other nonspecific skin eruption: Secondary | ICD-10-CM | POA: Diagnosis not present

## 2015-01-16 DIAGNOSIS — Z7901 Long term (current) use of anticoagulants: Secondary | ICD-10-CM | POA: Diagnosis not present

## 2015-01-31 DIAGNOSIS — L539 Erythematous condition, unspecified: Secondary | ICD-10-CM | POA: Diagnosis not present

## 2015-01-31 DIAGNOSIS — Z6827 Body mass index (BMI) 27.0-27.9, adult: Secondary | ICD-10-CM | POA: Diagnosis not present

## 2015-02-01 DIAGNOSIS — Z86711 Personal history of pulmonary embolism: Secondary | ICD-10-CM | POA: Diagnosis not present

## 2015-02-27 DIAGNOSIS — H25813 Combined forms of age-related cataract, bilateral: Secondary | ICD-10-CM | POA: Diagnosis not present

## 2015-03-04 DIAGNOSIS — Z86711 Personal history of pulmonary embolism: Secondary | ICD-10-CM | POA: Diagnosis not present

## 2015-03-18 DIAGNOSIS — M79674 Pain in right toe(s): Secondary | ICD-10-CM | POA: Diagnosis not present

## 2015-03-18 DIAGNOSIS — M79675 Pain in left toe(s): Secondary | ICD-10-CM | POA: Diagnosis not present

## 2015-03-18 DIAGNOSIS — B351 Tinea unguium: Secondary | ICD-10-CM | POA: Diagnosis not present

## 2015-04-16 DIAGNOSIS — Z86711 Personal history of pulmonary embolism: Secondary | ICD-10-CM | POA: Diagnosis not present

## 2015-05-16 DIAGNOSIS — Z86711 Personal history of pulmonary embolism: Secondary | ICD-10-CM | POA: Diagnosis not present

## 2015-06-06 DIAGNOSIS — E663 Overweight: Secondary | ICD-10-CM | POA: Diagnosis not present

## 2015-06-06 DIAGNOSIS — E119 Type 2 diabetes mellitus without complications: Secondary | ICD-10-CM | POA: Diagnosis not present

## 2015-06-06 DIAGNOSIS — L03119 Cellulitis of unspecified part of limb: Secondary | ICD-10-CM | POA: Diagnosis not present

## 2015-06-06 DIAGNOSIS — Z1389 Encounter for screening for other disorder: Secondary | ICD-10-CM | POA: Diagnosis not present

## 2015-06-06 DIAGNOSIS — Z6825 Body mass index (BMI) 25.0-25.9, adult: Secondary | ICD-10-CM | POA: Diagnosis not present

## 2015-06-10 DIAGNOSIS — B351 Tinea unguium: Secondary | ICD-10-CM | POA: Diagnosis not present

## 2015-06-10 DIAGNOSIS — M79675 Pain in left toe(s): Secondary | ICD-10-CM | POA: Diagnosis not present

## 2015-06-10 DIAGNOSIS — M79674 Pain in right toe(s): Secondary | ICD-10-CM | POA: Diagnosis not present

## 2015-06-13 DIAGNOSIS — Z86711 Personal history of pulmonary embolism: Secondary | ICD-10-CM | POA: Diagnosis not present

## 2015-07-06 ENCOUNTER — Emergency Department (HOSPITAL_COMMUNITY)
Admission: EM | Admit: 2015-07-06 | Discharge: 2015-07-06 | Disposition: A | Discharge status: Home / Self Care | Payer: Medicare Other | Attending: Emergency Medicine | Admitting: Emergency Medicine

## 2015-07-06 ENCOUNTER — Encounter (HOSPITAL_COMMUNITY): Payer: Self-pay | Admitting: *Deleted

## 2015-07-06 DIAGNOSIS — Z79899 Other long term (current) drug therapy: Secondary | ICD-10-CM | POA: Diagnosis not present

## 2015-07-06 DIAGNOSIS — H10021 Other mucopurulent conjunctivitis, right eye: Secondary | ICD-10-CM | POA: Insufficient documentation

## 2015-07-06 DIAGNOSIS — Z86711 Personal history of pulmonary embolism: Secondary | ICD-10-CM | POA: Insufficient documentation

## 2015-07-06 DIAGNOSIS — Z7901 Long term (current) use of anticoagulants: Secondary | ICD-10-CM | POA: Insufficient documentation

## 2015-07-06 DIAGNOSIS — Z8701 Personal history of pneumonia (recurrent): Secondary | ICD-10-CM | POA: Insufficient documentation

## 2015-07-06 DIAGNOSIS — I5020 Unspecified systolic (congestive) heart failure: Secondary | ICD-10-CM | POA: Diagnosis not present

## 2015-07-06 DIAGNOSIS — I10 Essential (primary) hypertension: Secondary | ICD-10-CM | POA: Insufficient documentation

## 2015-07-06 DIAGNOSIS — Z8781 Personal history of (healed) traumatic fracture: Secondary | ICD-10-CM | POA: Diagnosis not present

## 2015-07-06 DIAGNOSIS — H578 Other specified disorders of eye and adnexa: Secondary | ICD-10-CM | POA: Diagnosis present

## 2015-07-06 DIAGNOSIS — I4891 Unspecified atrial fibrillation: Secondary | ICD-10-CM | POA: Diagnosis not present

## 2015-07-06 DIAGNOSIS — J3489 Other specified disorders of nose and nasal sinuses: Secondary | ICD-10-CM | POA: Insufficient documentation

## 2015-07-06 DIAGNOSIS — I509 Heart failure, unspecified: Secondary | ICD-10-CM | POA: Insufficient documentation

## 2015-07-06 DIAGNOSIS — Q909 Down syndrome, unspecified: Secondary | ICD-10-CM | POA: Insufficient documentation

## 2015-07-06 MED ORDER — ERYTHROMYCIN 5 MG/GM OP OINT
TOPICAL_OINTMENT | Freq: Once | OPHTHALMIC | Status: AC
  Administered 2015-07-06: 1 via OPHTHALMIC
  Filled 2015-07-06: qty 3.5

## 2015-07-06 MED ORDER — FLUORESCEIN SODIUM 1 MG OP STRP
1.0000 | ORAL_STRIP | Freq: Once | OPHTHALMIC | Status: AC
  Administered 2015-07-06: 1 via OPHTHALMIC
  Filled 2015-07-06: qty 1

## 2015-07-06 NOTE — ED Notes (Signed)
Pt woke up with swelling and redness with eye matted shut this morning. States irritation to the eye yesterday.Resident from [facility].

## 2015-07-06 NOTE — Discharge Instructions (Signed)
Conjunctivitis Conjunctivitis is commonly called "pink eye." Conjunctivitis can be caused by bacterial or viral infection, allergies, or injuries. There is usually redness of the lining of the eye, itching, discomfort, and sometimes discharge. There may be deposits of matter along the eyelids. A viral infection usually causes a watery discharge, while a bacterial infection causes a yellowish, thick discharge. Pink eye is very contagious and spreads by direct contact. You may be given antibiotic eyedrops as part of your treatment. Before using your eye medicine, remove all drainage from the eye by washing gently with warm water and cotton balls. Continue to use the medication until you have awakened 2 mornings in a row without discharge from the eye. Do not rub your eye. This increases the irritation and helps spread infection. Use separate towels from other household members. Wash your hands with soap and water before and after touching your eyes. Use cold compresses to reduce pain and sunglasses to relieve irritation from light. Do not wear contact lenses or wear eye makeup until the infection is gone. SEEK MEDICAL CARE IF:   Your symptoms are not better after 3 days of treatment.  You have increased pain or trouble seeing.  The outer eyelids become very red or swollen. Document Released: 12/17/2004 Document Revised: 02/01/2012 Document Reviewed: 11/09/2005 [facility] Patient Information 2015 [place], [place]. This information is not intended to replace advice given to you by your health care provider. Make sure you discuss any questions you have with your health care provider.   Apply the antibiotic ointment to both eyes every 4 hours for the next 7 days or until symptoms resolve.  Also make sure to apply a small amount to your right upper external eyelid using a cotton swab.

## 2015-07-06 NOTE — ED Notes (Signed)
Patient being discharge, [facility] staff aware, transportation being sent.

## 2015-07-08 NOTE — ED Provider Notes (Signed)
CSN: [id number]     Arrival date & time 07/06/15  7829 History   First MD Initiated Contact with Patient 07/06/15 [phone]     Chief Complaint  Patient presents with  . Eye Problem    (Consider location/radiation/quality/duration/timing/severity/associated sxs/prior Treatment) Patient is a 69 y.o. female presenting with eye problem. The history is provided by the nursing home. The history is limited by a developmental delay (Pt has downs syndrome, poor history giver). No language interpreter was used.  Eye Problem Location:  R eye Quality: Pt had right eye irritation yesterday, today woke with increased redness, matting, purulent discharge. Severity:  Moderate Duration:  1 day Progression:  Worsening Chronicity:  New Context comment:  No known injury or chemical exposure. pt denies pain Relieved by:  None tried Ineffective treatments:  None tried Associated symptoms: discharge, redness and tearing     Past Medical History  Diagnosis Date  . Down syndrome   . Hypertension   . CHF (congestive heart failure)   . Atrial fibrillation   . Pneumonia   . Pulmonary embolism   . Leg fracture   . Systolic CHF    History reviewed. No pertinent past surgical history. No family history on file. Social History  Substance Use Topics  . Smoking status: Never Smoker   . Smokeless tobacco: None  . Alcohol Use: No   OB History    No data available     Review of Systems  Unable to perform ROS Eyes: Positive for discharge and redness.      Allergies  Review of patient's allergies indicates no known allergies.  Home Medications   Prior to Admission medications   Medication Sig Start Date End Date Taking? Authorizing Provider  alendronate (FOSAMAX) 70 MG tablet Take 70 mg by mouth once a week. Take with a full glass of water on an empty stomach.   Yes Historical Provider, MD  Calcium Carbonate (CALCIUM 600 PO) Take 600 mg by mouth daily.   Yes Historical Provider, MD  digoxin  (LANOXIN) 0.25 MG tablet Take 250 mcg by mouth daily.   Yes Historical Provider, MD  diltiazem (CARDIZEM) 120 MG tablet Take 120 mg by mouth 3 (three) times daily.   Yes Historical Provider, MD  furosemide (LASIX) 40 MG tablet Take 40 mg by mouth daily.    Yes Historical Provider, MD  glipiZIDE (GLUCOTROL) 5 MG tablet Take 5 mg by mouth 2 (two) times daily before a meal.   Yes Historical Provider, MD  lisinopril (PRINIVIL,ZESTRIL) 5 MG tablet Take 5 mg by mouth daily.   Yes Historical Provider, MD  metFORMIN (GLUCOPHAGE-XR) 500 MG 24 hr tablet Take 500 mg by mouth 3 (three) times daily.   Yes Historical Provider, MD  metoprolol tartrate (LOPRESSOR) 25 MG tablet Take 25 mg by mouth 2 (two) times daily.   Yes Historical Provider, MD  Vitamin D, Cholecalciferol, 400 UNITS TABS Take 400 Units by mouth daily.   Yes Historical Provider, MD  warfarin (COUMADIN) 6 MG tablet Take 6 mg by mouth daily.   Yes Historical Provider, MD  warfarin (COUMADIN) 5 MG tablet Take 5 mg by mouth daily.    Historical Provider, MD   BP 147/83 mmHg  Pulse 82  Temp(Src) 98.2 F (36.8 C) (Oral)  Resp 18  SpO2 98% Physical Exam  Constitutional: She is oriented to person, place, and time. She appears well-developed and well-nourished. No distress.  HENT:  Head: Normocephalic and atraumatic.  Right Ear: Tympanic membrane and ear  canal normal.  Left Ear: Tympanic membrane and ear canal normal.  Nose: Mucosal edema and rhinorrhea present.  Mouth/Throat: Uvula is midline, oropharynx is clear and moist and mucous membranes are normal. No oropharyngeal exudate, posterior oropharyngeal edema, posterior oropharyngeal erythema or tonsillar abscesses.  Eyes: EOM are normal. Pupils are equal, round, and reactive to light. Right eye exhibits exudate. Right conjunctiva is injected.  Fundoscopic exam:      The right eye shows red reflex.  Slit lamp exam:      The right eye shows no corneal abrasion, no corneal ulcer, no foreign  body, no hyphema, no fluorescein uptake and no anterior chamber bulge.  Right upper eyelid with mild edema and erythema  Cardiovascular: Normal rate.   Pulmonary/Chest: Effort normal. No respiratory distress.  Abdominal: Soft. There is no tenderness.  Musculoskeletal: Normal range of motion.  Neurological: She is alert and oriented to person, place, and time.  Skin: Skin is warm and dry. No rash noted.  Psychiatric: She has a normal mood and affect.    ED Course  Procedures (including critical care time) Labs Review Labs Reviewed - No data to display  Imaging Review No results found. I, Jenella Craigie, personally reviewed and evaluated these images and lab results as part of my medical decision-making.   EKG Interpretation None      MDM   Final diagnoses:  Pink eye, right     erythemomcin ophthal. Ointment supplied, apply to both eyes including right upper eyelid. F/u with pcp for a recheck if not improved over the next week.  Keep eye clean with warm washcloth soaks.  The patient appears reasonably screened and/or stabilized for discharge and I doubt any other medical condition or other [facility] requiring further screening, evaluation, or treatment in the ED at this time prior to discharge.     Burgess Amor, PA-C 07/08/15 1406  Bethann Berkshire, MD 07/08/15 [phone]

## 2015-07-15 DIAGNOSIS — Z6826 Body mass index (BMI) 26.0-26.9, adult: Secondary | ICD-10-CM | POA: Diagnosis not present

## 2015-07-15 DIAGNOSIS — E663 Overweight: Secondary | ICD-10-CM | POA: Diagnosis not present

## 2015-07-15 DIAGNOSIS — Z1389 Encounter for screening for other disorder: Secondary | ICD-10-CM | POA: Diagnosis not present

## 2015-07-15 DIAGNOSIS — Z86711 Personal history of pulmonary embolism: Secondary | ICD-10-CM | POA: Diagnosis not present

## 2015-07-15 DIAGNOSIS — H0011 Chalazion right upper eyelid: Secondary | ICD-10-CM | POA: Diagnosis not present

## 2015-08-15 DIAGNOSIS — Z86711 Personal history of pulmonary embolism: Secondary | ICD-10-CM | POA: Diagnosis not present

## 2015-09-02 DIAGNOSIS — M79674 Pain in right toe(s): Secondary | ICD-10-CM | POA: Diagnosis not present

## 2015-09-02 DIAGNOSIS — M79675 Pain in left toe(s): Secondary | ICD-10-CM | POA: Diagnosis not present

## 2015-09-02 DIAGNOSIS — B351 Tinea unguium: Secondary | ICD-10-CM | POA: Diagnosis not present

## 2015-09-12 DIAGNOSIS — Z7984 Long term (current) use of oral hypoglycemic drugs: Secondary | ICD-10-CM | POA: Diagnosis not present

## 2015-09-12 DIAGNOSIS — Z86711 Personal history of pulmonary embolism: Secondary | ICD-10-CM | POA: Diagnosis not present

## 2015-09-12 DIAGNOSIS — H25813 Combined forms of age-related cataract, bilateral: Secondary | ICD-10-CM | POA: Diagnosis not present

## 2015-09-12 DIAGNOSIS — E119 Type 2 diabetes mellitus without complications: Secondary | ICD-10-CM | POA: Diagnosis not present

## 2015-09-13 DIAGNOSIS — Z23 Encounter for immunization: Secondary | ICD-10-CM | POA: Diagnosis not present

## 2015-10-09 DIAGNOSIS — Z6827 Body mass index (BMI) 27.0-27.9, adult: Secondary | ICD-10-CM | POA: Diagnosis not present

## 2015-10-09 DIAGNOSIS — I8310 Varicose veins of unspecified lower extremity with inflammation: Secondary | ICD-10-CM | POA: Diagnosis not present

## 2015-10-09 DIAGNOSIS — E119 Type 2 diabetes mellitus without complications: Secondary | ICD-10-CM | POA: Diagnosis not present

## 2015-10-09 DIAGNOSIS — I10 Essential (primary) hypertension: Secondary | ICD-10-CM | POA: Diagnosis not present

## 2015-10-09 DIAGNOSIS — E663 Overweight: Secondary | ICD-10-CM | POA: Diagnosis not present

## 2015-10-14 DIAGNOSIS — Z86711 Personal history of pulmonary embolism: Secondary | ICD-10-CM | POA: Diagnosis not present

## 2015-11-12 DIAGNOSIS — Z86711 Personal history of pulmonary embolism: Secondary | ICD-10-CM | POA: Diagnosis not present

## 2015-12-07 ENCOUNTER — Emergency Department (HOSPITAL_COMMUNITY): Payer: Medicare Other

## 2015-12-07 ENCOUNTER — Inpatient Hospital Stay (HOSPITAL_COMMUNITY)
Admission: EM | Admit: 2015-12-07 | Discharge: 2015-12-10 | DRG: 292 | Disposition: A | Discharge status: Nursing Home (Includes ICF) | Payer: Medicare Other | Attending: Internal Medicine | Admitting: Internal Medicine

## 2015-12-07 ENCOUNTER — Encounter (HOSPITAL_COMMUNITY): Payer: Self-pay | Admitting: Emergency Medicine

## 2015-12-07 DIAGNOSIS — Q909 Down syndrome, unspecified: Secondary | ICD-10-CM

## 2015-12-07 DIAGNOSIS — J90 Pleural effusion, not elsewhere classified: Secondary | ICD-10-CM | POA: Diagnosis not present

## 2015-12-07 DIAGNOSIS — Z86711 Personal history of pulmonary embolism: Secondary | ICD-10-CM

## 2015-12-07 DIAGNOSIS — B351 Tinea unguium: Secondary | ICD-10-CM | POA: Diagnosis not present

## 2015-12-07 DIAGNOSIS — I482 Chronic atrial fibrillation, unspecified: Secondary | ICD-10-CM | POA: Diagnosis present

## 2015-12-07 DIAGNOSIS — M79675 Pain in left toe(s): Secondary | ICD-10-CM | POA: Diagnosis not present

## 2015-12-07 DIAGNOSIS — M79674 Pain in right toe(s): Secondary | ICD-10-CM | POA: Diagnosis not present

## 2015-12-07 DIAGNOSIS — I4891 Unspecified atrial fibrillation: Secondary | ICD-10-CM | POA: Diagnosis present

## 2015-12-07 DIAGNOSIS — I10 Essential (primary) hypertension: Secondary | ICD-10-CM | POA: Diagnosis not present

## 2015-12-07 DIAGNOSIS — Z7901 Long term (current) use of anticoagulants: Secondary | ICD-10-CM | POA: Diagnosis not present

## 2015-12-07 DIAGNOSIS — Z8249 Family history of ischemic heart disease and other diseases of the circulatory system: Secondary | ICD-10-CM

## 2015-12-07 DIAGNOSIS — I5043 Acute on chronic combined systolic (congestive) and diastolic (congestive) heart failure: Secondary | ICD-10-CM | POA: Diagnosis present

## 2015-12-07 DIAGNOSIS — I481 Persistent atrial fibrillation: Secondary | ICD-10-CM | POA: Diagnosis not present

## 2015-12-07 DIAGNOSIS — I5031 Acute diastolic (congestive) heart failure: Secondary | ICD-10-CM | POA: Diagnosis not present

## 2015-12-07 DIAGNOSIS — I110 Hypertensive heart disease with heart failure: Principal | ICD-10-CM | POA: Diagnosis present

## 2015-12-07 DIAGNOSIS — R0602 Shortness of breath: Secondary | ICD-10-CM | POA: Diagnosis not present

## 2015-12-07 DIAGNOSIS — I509 Heart failure, unspecified: Secondary | ICD-10-CM | POA: Diagnosis not present

## 2015-12-07 DIAGNOSIS — Z9889 Other specified postprocedural states: Secondary | ICD-10-CM

## 2015-12-07 DIAGNOSIS — J948 Other specified pleural conditions: Secondary | ICD-10-CM | POA: Diagnosis not present

## 2015-12-07 LAB — COMPREHENSIVE METABOLIC PANEL
ALBUMIN: 4.3 g/dL (ref 3.5–5.0)
ALK PHOS: 118 U/L (ref 38–126)
ALT: 23 U/L (ref 14–54)
ANION GAP: 11 (ref 5–15)
AST: 29 U/L (ref 15–41)
BILIRUBIN TOTAL: 1.1 mg/dL (ref 0.3–1.2)
BUN: 18 mg/dL (ref 6–20)
CALCIUM: 9.3 mg/dL (ref 8.9–10.3)
CO2: 27 mmol/L (ref 22–32)
CREATININE: 0.47 mg/dL (ref 0.44–1.00)
Chloride: 98 mmol/L — ABNORMAL LOW (ref 101–111)
GFR calc non Af Amer: >60 mL/min (ref >60)
GLUCOSE: 130 mg/dL — AB (ref 65–99)
Potassium: 4.1 mmol/L (ref 3.5–5.1)
Sodium: 136 mmol/L (ref 135–145)
TOTAL PROTEIN: 7.9 g/dL (ref 6.5–8.1)

## 2015-12-07 LAB — CBC WITH DIFFERENTIAL/PLATELET
Basophils Absolute: 0 10*3/uL (ref 0.0–0.1)
Basophils Relative: 1 %
Eosinophils Absolute: 0.1 10*3/uL (ref 0.0–0.7)
Eosinophils Relative: 1 %
HEMATOCRIT: 46.9 % — AB (ref 36.0–46.0)
HEMOGLOBIN: 15.4 g/dL — AB (ref 12.0–15.0)
LYMPHS ABS: 1.2 10*3/uL (ref 0.7–4.0)
Lymphocytes Relative: 14 %
MCH: 28.9 pg (ref 26.0–34.0)
MCHC: 32.8 g/dL (ref 30.0–36.0)
MCV: 88.2 fL (ref 78.0–100.0)
MONOS PCT: 8 %
Monocytes Absolute: 0.7 10*3/uL (ref 0.1–1.0)
NEUTROS ABS: 6.1 10*3/uL (ref 1.7–7.7)
NEUTROS PCT: 76 %
Platelets: 142 10*3/uL — ABNORMAL LOW (ref 150–400)
RBC: 5.32 MIL/uL — ABNORMAL HIGH (ref 3.87–5.11)
RDW: 14.9 % (ref 11.5–15.5)
WBC: 8.1 10*3/uL (ref 4.0–10.5)

## 2015-12-07 LAB — PROTIME-INR
INR: 3.69 — ABNORMAL HIGH (ref 0.00–1.49)
PROTHROMBIN TIME: 35.8 s — AB (ref 11.6–15.2)

## 2015-12-07 LAB — BRAIN NATRIURETIC PEPTIDE: B Natriuretic Peptide: 760 pg/mL — ABNORMAL HIGH (ref 0.0–100.0)

## 2015-12-07 LAB — TROPONIN I: Troponin I: 0.03 ng/mL (ref <0.031)

## 2015-12-07 MED ORDER — METOPROLOL TARTRATE 25 MG PO TABS
25.0000 mg | ORAL_TABLET | Freq: Two times a day (BID) | ORAL | Status: DC
  Administered 2015-12-07 – 2015-12-10 (×7): 25 mg via ORAL
  Filled 2015-12-07 (×7): qty 1

## 2015-12-07 MED ORDER — SODIUM CHLORIDE 0.9 % IJ SOLN: 3.0000 mL | INTRAMUSCULAR | Status: DC | PRN

## 2015-12-07 MED ORDER — SODIUM CHLORIDE 0.9 % IV SOLN: 250.0000 mL | INTRAVENOUS | Status: DC | PRN

## 2015-12-07 MED ORDER — ACETAMINOPHEN 325 MG PO TABS: 650.0000 mg | ORAL_TABLET | ORAL | Status: DC | PRN

## 2015-12-07 MED ORDER — SODIUM CHLORIDE 0.9 % IJ SOLN
3.0000 mL | Freq: Two times a day (BID) | INTRAMUSCULAR | Status: DC
  Administered 2015-12-07 – 2015-12-09 (×6): 3 mL via INTRAVENOUS

## 2015-12-07 MED ORDER — FUROSEMIDE 10 MG/ML IJ SOLN
20.0000 mg | Freq: Two times a day (BID) | INTRAMUSCULAR | Status: DC
  Administered 2015-12-07 – 2015-12-10 (×7): 20 mg via INTRAVENOUS
  Filled 2015-12-07 (×7): qty 2

## 2015-12-07 MED ORDER — ONDANSETRON HCL 4 MG/2ML IJ SOLN
4.0000 mg | Freq: Four times a day (QID) | INTRAMUSCULAR | Status: DC | PRN
Start: 2015-12-07 — End: 2015-12-10

## 2015-12-07 MED ORDER — LISINOPRIL 5 MG PO TABS
5.0000 mg | ORAL_TABLET | Freq: Every day | ORAL | Status: DC
  Administered 2015-12-07 – 2015-12-10 (×4): 5 mg via ORAL
  Filled 2015-12-07 (×4): qty 1

## 2015-12-07 MED ORDER — WARFARIN - PHARMACIST DOSING INPATIENT
Status: DC
  Administered 2015-12-08: 16:00:00

## 2015-12-07 MED ORDER — DIGOXIN 125 MCG PO TABS
250.0000 ug | ORAL_TABLET | Freq: Every day | ORAL | Status: DC
  Administered 2015-12-07 – 2015-12-10 (×4): 250 ug via ORAL
  Filled 2015-12-07 (×4): qty 2

## 2015-12-07 NOTE — Plan of Care (Signed)
Problem: Education: Goal: Ability to verbalize understanding of medication therapies will improve Outcome: Completed/Met Date Met:  12/07/15 Family at bedside

## 2015-12-07 NOTE — Progress Notes (Signed)
ANTICOAGULATION CONSULT NOTE - Initial Consult  Pharmacy Consult for Coumadin (chronic Rx PTA) Indication: atrial fibrillation  No Known Allergies  Patient Measurements: Height: 5\' 5"  (165.1 cm) Weight: 159 lb 13.3 oz (72.5 kg) IBW/kg (Calculated) : 57  Vital Signs: Temp: 97.6 F (36.4 C) (01/14 1214) Temp Source: Axillary (01/14 1214) BP: 148/102 mmHg (01/14 1214) Pulse Rate: 98 (01/14 1214)  Labs:  Recent Labs  12/07/15 0752  HGB 15.4*  HCT 46.9*  PLT 142*  LABPROT 35.8*  INR 3.69*  CREATININE 0.47  TROPONINI 0.03    Estimated Creatinine Clearance: 65.3 mL/min (by C-G formula based on Cr of 0.47).   Medical History: Past Medical History  Diagnosis Date  . Down syndrome   . Hypertension   . CHF (congestive heart failure) (HCC)   . Atrial fibrillation (HCC)   . Pneumonia   . Pulmonary embolism (HCC)   . Leg fracture   . Systolic CHF (HCC)   . Shortness of breath     Medications:  Prescriptions prior to admission  Medication Sig Dispense Refill Last Dose  . alendronate (FOSAMAX) 70 MG tablet Take 70 mg by mouth once a week. Takes on Mondays. Take with a full glass of water on an empty stomach.   12/02/2015  . Calcium Carbonate (CALCIUM 600 PO) Take 600 mg by mouth daily.   12/06/2015 at Unknown time  . digoxin (LANOXIN) 0.25 MG tablet Take 250 mcg by mouth daily.   12/06/2015 at Unknown time  . diltiazem (CARDIZEM) 120 MG tablet Take 120 mg by mouth 3 (three) times daily.   12/06/2015 at Unknown time  . furosemide (LASIX) 40 MG tablet Take 40 mg by mouth daily.    12/06/2015 at Unknown time  . glipiZIDE (GLUCOTROL) 5 MG tablet Take 5 mg by mouth 2 (two) times daily before a meal.   12/06/2015 at Unknown time  . lisinopril (PRINIVIL,ZESTRIL) 5 MG tablet Take 5 mg by mouth daily.   12/06/2015 at Unknown time  . metFORMIN (GLUCOPHAGE-XR) 500 MG 24 hr tablet Take 500 mg by mouth daily.    12/06/2015 at Unknown time  . metoprolol tartrate (LOPRESSOR) 25 MG tablet Take 25  mg by mouth 2 (two) times daily.   12/06/2015 at 2000  . triamcinolone cream (KENALOG) 0.1 % Apply 1 application topically 2 (two) times daily.   12/06/2015 at Unknown time  . Vitamin D, Cholecalciferol, 400 UNITS TABS Take 400 Units by mouth daily.   12/06/2015 at Unknown time  . warfarin (COUMADIN) 2 MG tablet Take 1-2 tablets by mouth See admin instructions. Take 1 tablet by mouth on Tuesday, Thursday and Saturday.  Take (1/2) tablet by mouth on Sunday, Monday, Wednesday, and Friday.   12/06/2015 at 1700  . warfarin (COUMADIN) 6 MG tablet Take 6 mg by mouth every evening.   12/06/2015 at 1700    Assessment: INR elevated > 3 on admission.  No coumadin today.  Goal of Therapy:  INR 2-3 Monitor platelets by anticoagulation protocol: Yes   Plan:  No coumadin today  [name], [name] 12/07/2015,12:56 PM

## 2015-12-07 NOTE — H&P (Signed)
History and Physical  Angel ChalkSybil J Mirando ZOX:[id number] DOB: [birth year] DOA: 12/07/2015  Referring physician: Donnetta HutchingBrian Cook, ER physician PCP: Kirk RuthsMCGOUGH,WILLIAM M, MD   Chief Complaint: dyspnea on exertion   HPI: Angel Torres is a 70 y.o. female   with past mental history of atrial fibrillation on chronic Coumadin and diastolic CHF who also has a history of Down syndrome and stays at a nursing facility. Family noted that she was quite dyspneic on exertion, worse than a few weeks ago when they saw her Christmas time. They became concerned so took patient to the emergency room. Patient they are noted to have oxygen saturations in the 80s on room air and 95% with 2 L nasal cannula. Labs noted BNP at 760 him otherwise rest of labs normal. Hospitalist called for further evaluation and admission   Review of Systems:  Patient seen after arrival to floor . Pt With no complaints. Family says that is her baseline-denies any chest pain or shortness of breath .  Pt denies any headaches, vision changes, dysphagia, chest pain, palpitations, shortness of breath, wheezing or coughing, abdominal pain, hematuria or dysuria, constipation or diarrhea, focal extremity numbness weakness or pain .  Review of systems are otherwise negative  Past Medical History  Diagnosis Date  . Down syndrome   . Hypertension   . CHF (congestive heart failure) (HCC)   . Atrial fibrillation (HCC)   . Pneumonia   . Pulmonary embolism (HCC)   . Leg fracture   . Systolic CHF (HCC)   . Shortness of breath    History reviewed. No pertinent past surgical history. Social History:  reports that she has never smoked. She does not have any smokeless tobacco history on file. She reports that she does not drink alcohol or use illicit drugs. Patient lives at in a skilled nursing facility  & is able to participate in activities of daily living with use of a walker  No Known Allergies  Family history: Hypertension  Prior to Admission  medications   Medication Sig Start Date End Date Taking? Authorizing Provider  alendronate (FOSAMAX) 70 MG tablet Take 70 mg by mouth once a week. Takes on Mondays. Take with a full glass of water on an empty stomach.   Yes Historical Provider, MD  Calcium Carbonate (CALCIUM 600 PO) Take 600 mg by mouth daily.   Yes Historical Provider, MD  digoxin (LANOXIN) 0.25 MG tablet Take 250 mcg by mouth daily.   Yes Historical Provider, MD  diltiazem (CARDIZEM) 120 MG tablet Take 120 mg by mouth 3 (three) times daily.   Yes Historical Provider, MD  furosemide (LASIX) 40 MG tablet Take 40 mg by mouth daily.    Yes Historical Provider, MD  glipiZIDE (GLUCOTROL) 5 MG tablet Take 5 mg by mouth 2 (two) times daily before a meal.   Yes Historical Provider, MD  lisinopril (PRINIVIL,ZESTRIL) 5 MG tablet Take 5 mg by mouth daily.   Yes Historical Provider, MD  metFORMIN (GLUCOPHAGE-XR) 500 MG 24 hr tablet Take 500 mg by mouth daily.    Yes Historical Provider, MD  metoprolol tartrate (LOPRESSOR) 25 MG tablet Take 25 mg by mouth 2 (two) times daily.   Yes Historical Provider, MD  triamcinolone cream (KENALOG) 0.1 % Apply 1 application topically 2 (two) times daily.   Yes Historical Provider, MD  Vitamin D, Cholecalciferol, 400 UNITS TABS Take 400 Units by mouth daily.   Yes Historical Provider, MD  warfarin (COUMADIN) 2 MG tablet Take 1-2 tablets by  mouth See admin instructions. Take 1 tablet by mouth on Tuesday, Thursday and Saturday.  Take (1/2) tablet by mouth on Sunday, Monday, Wednesday, and Friday. 12/02/15  Yes Historical Provider, MD  warfarin (COUMADIN) 6 MG tablet Take 6 mg by mouth every evening.   Yes Historical Provider, MD    Physical Exam: BP 148/102 mmHg  Pulse 98  Temp(Src) 97.6 F (36.4 C) (Axillary)  Resp 24  Ht 5\' 5"  (1.651 m)  Wt 72.5 kg (159 lb 13.3 oz)  BMI 26.60 kg/m2  SpO2 97%  General:  alert and oriented 2, no acute distress , face consistent with Down syndrome Eyes: sclera  nonicteric, extraocular movements are intact  ENT: normocephalic, atraumatic, mucous membranes are slightly dry  Neck: no JVD  Cardiovascular: regular rate and rhythm, S1-S2, soft 2/6 systolic ejection murmur  respiratory: Clear to auscultation bilaterally  Abdomen: soft, obese, nontender, positive bowel sounds  Skin: no skin breaks, tears or lesions  Musculoskeletal: no clubbing or cyanosis, 1+ pitting edema from the knees down bilaterally  Psychiatric: patient appears appropriate, no evidence of psychoses,  Neurologic: no focal deficits           Labs on Admission:  Basic Metabolic Panel:  Recent Labs Lab 12/07/15 0752  NA 136  K 4.1  CL 98*  CO2 27  GLUCOSE 130*  BUN 18  CREATININE 0.47  CALCIUM 9.3   Liver Function Tests:  Recent Labs Lab 12/07/15 0752  AST 29  ALT 23  ALKPHOS 118  BILITOT 1.1  PROT 7.9  ALBUMIN 4.3   No results for input(s): LIPASE, AMYLASE in the last 168 hours. No results for input(s): AMMONIA in the last 168 hours. CBC:  Recent Labs Lab 12/07/15 0752  WBC 8.1  NEUTROABS 6.1  HGB 15.4*  HCT 46.9*  MCV 88.2  PLT 142*   Cardiac Enzymes:  Recent Labs Lab 12/07/15 0752  TROPONINI 0.03    BNP (last 3 results)  Recent Labs  12/07/15 0752  BNP 760.0*    ProBNP (last 3 results) No results for input(s): PROBNP in the last 8760 hours.  CBG: No results for input(s): GLUCAP in the last 168 hours.  Radiological Exams on Admission: Dg Chest Portable 1 View  12/07/2015  CLINICAL DATA:  Short of breath EXAM: PORTABLE CHEST 1 VIEW COMPARISON:  04/17/2014 FINDINGS: Large right pleural effusion. Cardiomegaly. Vascular congestion is stable. No pneumothorax. IMPRESSION: New large right pleural effusion. Stable cardiomegaly and vascular congestion Electronically Signed   By: Jolaine Click M.D.   On: 12/07/2015 08:53    EKG: Independently reviewed. 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 Pleural effusion, right colon secondary to heart failure  . Acute diastolic heart failure (HCC): Recheck echocardiogram. IV Lasix. Beta blocker and ACE inhibitor.  . Atrial fibrillation [facility]): Rate controlled. Chads 2 score of 6.  Coumadin per pharmacy, patient INR therapeutic  . Essential hypertension: Continue beta blocker and ACE inhibitor Down syndrome  Consultants: None   Code Status: As confirmed by brother   Family Communication: Multiple family members at the bedside    Disposition Plan: Anticipate discharge in a few days following full  diuresis  Time spent: 35 minutes   Hollice Espy [facility] Pager [phone]

## 2015-12-07 NOTE — ED Notes (Signed)
Pt breathing appears improved at this time. Pt alert and oriented. Decreased accessory muscle use. No cyanosis noted. Pt remains on o2 via [place]

## 2015-12-07 NOTE — ED Notes (Signed)
Pt from [facility] facility. Pt was found to be SOB and cyanotic. Pt hx of CHF and PE. Pt 89% on RA. Placed on 2L nasal cannula. Pt AOx4.

## 2015-12-07 NOTE — ED Notes (Signed)
Pt ambulated to bathroom with nurse assist.

## 2015-12-07 NOTE — ED Provider Notes (Signed)
CSN: [id number]     Arrival date & time 12/07/15  0736 History  By signing my name below, I, [name], attest that this documentation has been prepared under the direction and in the presence of [name], MD. Electronically Signed: [name], ED Scribe. 12/07/2015. 9:19 AM.    Chief Complaint  Patient presents with  . Shortness of Breath   LEVEL 5 CAVEAT-DEVELOPMENTAL DELAY   The history is provided by the patient and the nursing home. The history is limited by a developmental delay. No language interpreter was used.    HPI Comments: [name] is a 70 y.o. female with a h/o down syndrome, HTN, CHF and PE who presents to the Emergency Department from [facility] complaining of sudden onset shortness of breath for unknown length of time. Per nursing home pt was found to be cyanotic with SPO2 89% on RA. She was placed on 2LPM prior to arrival with relief. She denies any chest pain.    Past Medical History  Diagnosis Date  . Down syndrome   . Hypertension   . CHF (congestive heart failure) (HCC)   . Atrial fibrillation (HCC)   . Pneumonia   . Pulmonary embolism (HCC)   . Leg fracture   . Systolic CHF (HCC)   . Shortness of breath    History reviewed. No pertinent past surgical history. No family history on file. Social History  Substance Use Topics  . Smoking status: Never Smoker   . Smokeless tobacco: None  . Alcohol Use: No   OB History    No data available     Review of Systems  Unable to perform ROS: Other      Allergies  Review of patient's allergies indicates no known allergies.  Home Medications   Prior to Admission medications   Medication Sig Start Date End Date Taking? Authorizing Provider  alendronate (FOSAMAX) 70 MG tablet Take 70 mg by mouth once a week. Take with a full glass of water on an empty stomach.    Historical Provider, MD  Calcium Carbonate (CALCIUM 600 PO) Take 600 mg by mouth daily.     Historical Provider, MD  digoxin (LANOXIN) 0.25 MG tablet Take 250 mcg by mouth daily.    Historical Provider, MD  diltiazem (CARDIZEM) 120 MG tablet Take 120 mg by mouth 3 (three) times daily.    Historical Provider, MD  furosemide (LASIX) 40 MG tablet Take 40 mg by mouth daily.     Historical Provider, MD  glipiZIDE (GLUCOTROL) 5 MG tablet Take 5 mg by mouth 2 (two) times daily before a meal.    Historical Provider, MD  lisinopril (PRINIVIL,ZESTRIL) 5 MG tablet Take 5 mg by mouth daily.    Historical Provider, MD  metFORMIN (GLUCOPHAGE-XR) 500 MG 24 hr tablet Take 500 mg by mouth 3 (three) times daily.    Historical Provider, MD  metoprolol tartrate (LOPRESSOR) 25 MG tablet Take 25 mg by mouth 2 (two) times daily.    Historical Provider, MD  Vitamin D, Cholecalciferol, 400 UNITS TABS Take 400 Units by mouth daily.    Historical Provider, MD  warfarin (COUMADIN) 5 MG tablet Take 5 mg by mouth daily.    Historical Provider, MD  warfarin (COUMADIN) 6 MG tablet Take 6 mg by mouth daily.    Historical Provider, MD   Triage Vitals: BP 135/102 mmHg  Pulse 82  Temp(Src) 98.1 F (36.7 C) (Oral)  Resp 22  SpO2 100%  Physical  Exam  Constitutional: She is oriented to person, place, and time. She appears well-developed and well-nourished.  Dyspenic and tachypneic  HENT:  Head: Normocephalic and atraumatic.  Eyes: Conjunctivae and EOM are normal. Pupils are equal, round, and reactive to light.  Neck: Normal range of motion. Neck supple.  Cardiovascular: Normal rate and regular rhythm.   Pulmonary/Chest: Effort normal.  Decreased breath sounds  Abdominal: Soft. Bowel sounds are normal.  Musculoskeletal: Normal range of motion.  2+ to 3+ peripheral edema  Neurological: She is alert and oriented to person, place, and time.  Skin: Skin is warm and dry.  Psychiatric: She has a normal mood and affect. Her behavior is normal.  Nursing note and vitals reviewed.   ED Course  Procedures (including  critical care time)  DIAGNOSTIC STUDIES: Oxygen Saturation is 100% on 2LPM via [place] normal by my interpretation.    COORDINATION OF CARE: 9:19 AM- Will order CXR, basic labs and admit. Pt advised of plan for treatment and pt agrees.     Labs Review Labs Reviewed  CBC WITH DIFFERENTIAL/PLATELET - Abnormal; Notable for the following:    RBC 5.32 (*)    Hemoglobin 15.4 (*)    HCT 46.9 (*)    Platelets 142 (*)    All other components within normal limits  COMPREHENSIVE METABOLIC PANEL - Abnormal; Notable for the following:    Chloride 98 (*)    Glucose, Bld 130 (*)    All other components within normal limits  BRAIN NATRIURETIC PEPTIDE - Abnormal; Notable for the following:    B Natriuretic Peptide 760.0 (*)    All other components within normal limits  TROPONIN I    Imaging Review Dg Chest Portable 1 View  12/07/2015  CLINICAL DATA:  Short of breath EXAM: PORTABLE CHEST 1 VIEW COMPARISON:  04/17/2014 FINDINGS: Large right pleural effusion. Cardiomegaly. Vascular congestion is stable. No pneumothorax. IMPRESSION: New large right pleural effusion. Stable cardiomegaly and vascular congestion Electronically Signed   By: Jolaine Click M.D.   On: 12/07/2015 08:53   I have personally reviewed and evaluated these images and lab results as part of my medical decision-making.   EKG Interpretation None      Date: 12/07/2015  Rate: 84  Rhythm: atrial fib  QRS Axis: normal  Intervals: normal  ST/T Wave abnormalities: normal  Conduction Disutrbances: PVC  Narrative Interpretation: unremarkable  CRITICAL CARE Performed by: Donnetta Hutching Total critical care time: 30 minutes Critical care time was exclusive of separately billable procedures and treating other patients. Critical care was necessary to treat or prevent imminent or life-threatening deterioration. Critical care was time spent personally by me on the following activities: development of treatment plan with patient and/or  surrogate as well as nursing, discussions with consultants, evaluation of patient's response to treatment, examination of patient, obtaining history from patient or surrogate, ordering and performing treatments and interventions, ordering and review of laboratory studies, ordering and review of radiographic studies, pulse oximetry and re-evaluation of patient's condition.  MDM   Final diagnoses:  Pleural effusion, right   Patient is dyspneic and tachypnea take but hemodynamically stable. Chest x-ray shows a large right pleural effusion. Admit to general medicine.  I personally performed the services described in this documentation, which was scribed in my presence. The recorded information has been reviewed and is accurate.      Donnetta Hutching, MD 12/07/15 [phone]

## 2015-12-08 ENCOUNTER — Inpatient Hospital Stay (HOSPITAL_COMMUNITY): Payer: Medicare Other

## 2015-12-08 DIAGNOSIS — I5031 Acute diastolic (congestive) heart failure: Secondary | ICD-10-CM

## 2015-12-08 DIAGNOSIS — I482 Chronic atrial fibrillation: Secondary | ICD-10-CM

## 2015-12-08 DIAGNOSIS — Q909 Down syndrome, unspecified: Secondary | ICD-10-CM

## 2015-12-08 DIAGNOSIS — J948 Other specified pleural conditions: Secondary | ICD-10-CM

## 2015-12-08 DIAGNOSIS — I509 Heart failure, unspecified: Secondary | ICD-10-CM

## 2015-12-08 DIAGNOSIS — I10 Essential (primary) hypertension: Secondary | ICD-10-CM

## 2015-12-08 LAB — BASIC METABOLIC PANEL
ANION GAP: 10 (ref 5–15)
BUN: 15 mg/dL (ref 6–20)
CHLORIDE: 99 mmol/L — AB (ref 101–111)
CO2: 28 mmol/L (ref 22–32)
Calcium: 8.9 mg/dL (ref 8.9–10.3)
Creatinine, Ser: 0.52 mg/dL (ref 0.44–1.00)
Glucose, Bld: 93 mg/dL (ref 65–99)
POTASSIUM: 3.8 mmol/L (ref 3.5–5.1)
SODIUM: 137 mmol/L (ref 135–145)

## 2015-12-08 LAB — PROTIME-INR
INR: 2.75 — AB (ref 0.00–1.49)
PROTHROMBIN TIME: 28.7 s — AB (ref 11.6–15.2)

## 2015-12-08 LAB — MAGNESIUM: Magnesium: 1.9 mg/dL (ref 1.7–2.4)

## 2015-12-08 MED ORDER — WARFARIN SODIUM 5 MG PO TABS
6.0000 mg | ORAL_TABLET | Freq: Once | ORAL | Status: AC
  Administered 2015-12-08: 6 mg via ORAL
  Filled 2015-12-08: qty 1

## 2015-12-08 NOTE — Progress Notes (Signed)
[facility] PROGRESS NOTE  Angel Torres ZOX:[id number] DOB: 1946/08/18 DOA: 12/07/2015 PCP: Kirk Ruths, MD  Assessment/Plan: Large Right Pleural Effusion -Will request thoracentesis by IR in am. -Suspect related to CHF.  Acute CHF, presumed diastolic -Continue lasix, strive for negative fluid balance. -ECHO pending.  A Fib -Rate controlled. -Anticoagulated on coumadin.  Essential HTN -Well controlled.  Down Syndrome -Noted  Code Status: Full Code Family Communication: Discussed with sister Olegario Messier at bedside  Disposition Plan: Back to ALF when ready   Consultants:  none   Antibiotics:  one   Subjective: Still with some WOB, unable to verbalize any complaints  Objective: Filed Vitals:   12/07/15 2007 12/07/15 2114 12/08/15 0354 12/08/15 0357  BP: 160/99 139/86  140/92  Pulse: 75   91  Temp: 98.6 F (37 C)   97.9 F (36.6 C)  TempSrc: Oral   Oral  Resp: 22   22  Height:      Weight:   69.99 kg (154 lb 4.8 oz)   SpO2: 98%   98%    Intake/Output Summary (Last 24 hours) at 12/08/15 1201 Last data filed at 12/08/15 1107  Gross per 24 hour  Intake    720 ml  Output   3650 ml  Net  -2930 ml   Filed Weights   12/07/15 1214 12/08/15 0354  Weight: 72.5 kg (159 lb 13.3 oz) 69.99 kg (154 lb 4.8 oz)    Exam:   General:  awake  Cardiovascular: RRR  Respiratory: decreased BS right lung fields, crackles bilaterally  Abdomen: S/NT/ND/+BS  Extremities: no C/C/E   Neurologic:  Non-focal  Data Reviewed: Basic Metabolic Panel:  Recent Labs Lab 12/07/15 0752 12/08/15 0600 12/08/15 0707  NA 136 137  --   K 4.1 3.8  --   CL 98* 99*  --   CO2 27 28  --   GLUCOSE 130* 93  --   BUN 18 15  --   CREATININE 0.47 0.52  --   CALCIUM 9.3 8.9  --   MG  --   --  1.9   Liver Function Tests:  Recent Labs Lab 12/07/15 0752  AST 29  ALT 23  ALKPHOS 118  BILITOT 1.1  PROT 7.9  ALBUMIN 4.3   No results for input(s): LIPASE,  AMYLASE in the last 168 hours. No results for input(s): AMMONIA in the last 168 hours. CBC:  Recent Labs Lab 12/07/15 0752  WBC 8.1  NEUTROABS 6.1  HGB 15.4*  HCT 46.9*  MCV 88.2  PLT 142*   Cardiac Enzymes:  Recent Labs Lab 12/07/15 0752  TROPONINI 0.03   BNP (last 3 results)  Recent Labs  12/07/15 0752  BNP 760.0*    ProBNP (last 3 results) No results for input(s): PROBNP in the last 8760 hours.  CBG: No results for input(s): GLUCAP in the last 168 hours.  No results found for this or any previous visit (from the past 240 hour(s)).   Studies: Dg Chest Portable 1 View  12/07/2015  CLINICAL DATA:  Short of breath EXAM: PORTABLE CHEST 1 VIEW COMPARISON:  04/17/2014 FINDINGS: Large right pleural effusion. Cardiomegaly. Vascular congestion is stable. No pneumothorax. IMPRESSION: New large right pleural effusion. Stable cardiomegaly and vascular congestion Electronically Signed   By: Jolaine Click M.D.   On: 12/07/2015 08:53    Scheduled Meds: . digoxin  250 mcg Oral Daily  . furosemide  20 mg Intravenous Q12H  . lisinopril  5 mg Oral  Daily  . metoprolol tartrate  25 mg Oral BID  . sodium chloride  3 mL Intravenous Q12H  . warfarin  6 mg Oral Once  . Warfarin - Pharmacist Dosing Inpatient   Does not apply Q24H   Continuous Infusions:   Active Problems:   Pleural effusion, right   Acute diastolic heart failure (HCC)   Atrial fibrillation (HCC)   Down's syndrome   Essential hypertension    Time spent: 25 minutes. Greater than 50% of this time was spent in direct contact with the patient coordinating care.    Angel Torres,Angel Torres  [facility] Pager [phone]  If 7PM-7AM, please contact night-coverage at [email or web address], password [email or web address] 12/08/2015, 12:01 PM  LOS: 1 day

## 2015-12-08 NOTE — Progress Notes (Signed)
Five Beat run of V-Tach.  MD notified new orders received. Will continue to monitor.

## 2015-12-08 NOTE — Progress Notes (Signed)
ANTICOAGULATION CONSULT NOTE - follow up  Pharmacy Consult for Coumadin (chronic Rx PTA) Indication: atrial fibrillation  No Known Allergies  Patient Measurements: Height: 5\' 5"  (165.1 cm) Weight: 154 lb 4.8 oz (69.99 kg) IBW/kg (Calculated) : 57  Vital Signs: Temp: 97.9 F (36.6 C) (01/15 0357) Temp Source: Oral (01/15 0357) BP: 140/92 mmHg (01/15 0357) Pulse Rate: 91 (01/15 0357)  Labs:  Recent Labs  12/07/15 0752 12/08/15 0600  HGB 15.4*  --   HCT 46.9*  --   PLT 142*  --   LABPROT 35.8* 28.7*  INR 3.69* 2.75*  CREATININE 0.47 0.52  TROPONINI 0.03  --    Estimated Creatinine Clearance: 64.3 mL/min (by C-G formula based on Cr of 0.52).   Medical History: Past Medical History  Diagnosis Date  . Down syndrome   . Hypertension   . CHF (congestive heart failure) (HCC)   . Atrial fibrillation (HCC)   . Pneumonia   . Pulmonary embolism (HCC)   . Leg fracture   . Systolic CHF (HCC)   . Shortness of breath    Medications:  Prescriptions prior to admission  Medication Sig Dispense Refill Last Dose  . alendronate (FOSAMAX) 70 MG tablet Take 70 mg by mouth once a week. Takes on Mondays. Take with a full glass of water on an empty stomach.   12/02/2015  . Calcium Carbonate (CALCIUM 600 PO) Take 600 mg by mouth daily.   12/06/2015 at Unknown time  . digoxin (LANOXIN) 0.25 MG tablet Take 250 mcg by mouth daily.   12/06/2015 at Unknown time  . diltiazem (CARDIZEM) 120 MG tablet Take 120 mg by mouth 3 (three) times daily.   12/06/2015 at Unknown time  . furosemide (LASIX) 40 MG tablet Take 40 mg by mouth daily.    12/06/2015 at Unknown time  . glipiZIDE (GLUCOTROL) 5 MG tablet Take 5 mg by mouth 2 (two) times daily before a meal.   12/06/2015 at Unknown time  . lisinopril (PRINIVIL,ZESTRIL) 5 MG tablet Take 5 mg by mouth daily.   12/06/2015 at Unknown time  . metFORMIN (GLUCOPHAGE-XR) 500 MG 24 hr tablet Take 500 mg by mouth daily.    12/06/2015 at Unknown time  . metoprolol  tartrate (LOPRESSOR) 25 MG tablet Take 25 mg by mouth 2 (two) times daily.   12/06/2015 at 2000  . triamcinolone cream (KENALOG) 0.1 % Apply 1 application topically 2 (two) times daily.   12/06/2015 at Unknown time  . Vitamin D, Cholecalciferol, 400 UNITS TABS Take 400 Units by mouth daily.   12/06/2015 at Unknown time  . warfarin (COUMADIN) 2 MG tablet Take 1-2 tablets by mouth See admin instructions. Take 1 tablet by mouth on Tuesday, Thursday and Saturday.  Take (1/2) tablet by mouth on Sunday, Monday, Wednesday, and Friday.   12/06/2015 at 1700  . warfarin (COUMADIN) 6 MG tablet Take 6 mg by mouth every evening.   12/06/2015 at 1700   Assessment: INR elevated > 3 on admission but has now trended down to therapeutic range.   Home dose listed above (may need adjustment).  Goal of Therapy:  INR 2-3 Monitor platelets by anticoagulation protocol: Yes   Plan:  Coumadin 6mg  today x 1 INR daily  [name], Atavia Poppe A 12/08/2015,8:44 AM

## 2015-12-08 NOTE — Progress Notes (Signed)
  Echocardiogram 2D Echocardiogram has been performed.  Angel Torres 12/08/2015, 1:50 PM

## 2015-12-09 ENCOUNTER — Inpatient Hospital Stay (HOSPITAL_COMMUNITY): Payer: Medicare Other

## 2015-12-09 ENCOUNTER — Encounter (HOSPITAL_COMMUNITY): Payer: Self-pay

## 2015-12-09 HISTORY — PX: THORACENTESIS: SHX235

## 2015-12-09 LAB — BASIC METABOLIC PANEL
Anion gap: 9 (ref 5–15)
BUN: 20 mg/dL (ref 6–20)
CHLORIDE: 99 mmol/L — AB (ref 101–111)
CO2: 29 mmol/L (ref 22–32)
Calcium: 8.7 mg/dL — ABNORMAL LOW (ref 8.9–10.3)
Creatinine, Ser: 0.55 mg/dL (ref 0.44–1.00)
GFR calc non Af Amer: >60 mL/min (ref >60)
Glucose, Bld: 86 mg/dL (ref 65–99)
POTASSIUM: 4.1 mmol/L (ref 3.5–5.1)
SODIUM: 137 mmol/L (ref 135–145)

## 2015-12-09 LAB — PROTIME-INR
INR: 2.19 — AB (ref 0.00–1.49)
PROTHROMBIN TIME: 24.1 s — AB (ref 11.6–15.2)

## 2015-12-09 MED ORDER — WARFARIN SODIUM 5 MG PO TABS
6.0000 mg | ORAL_TABLET | Freq: Once | ORAL | Status: AC
  Administered 2015-12-09: 6 mg via ORAL
  Filled 2015-12-09: qty 1

## 2015-12-09 NOTE — Procedures (Signed)
PreOperative Dx: Down syndrome, RIGHT pleural effusion Postoperative Dx: Down syndrome,  pleural effusion Procedure:   US guided RIGHT thoracentesis Radiologist:  Tyron RussellBoles Anesthesia:  9 ml of 1% lidocaine Specimen:  1500 ml of yellow colored fluid EBL:   < 1 ml Complications: None

## 2015-12-09 NOTE — Clinical Social Work Note (Signed)
Clinical Social Work Assessment  Patient Details  Name: Angel Torres MRN: [id number] Date of Birth: 04/12/1946  Date of referral:  12/09/15               Reason for consult:  Discharge Planning                Permission sought to share information with:  Family Supports Permission granted to share information::     Name::     [name]/Ken  Agency::     Relationship::  siblings  Contact Information:     Housing/Transportation Living arrangements for the past 2 months:  Assisted Living Facility Source of Information:  Patient, Other (Comment Required) (siblings) Patient Interpreter Needed:  None Criminal Activity/Legal Involvement Pertinent to Current Situation/Hospitalization:  No - Comment as needed Significant Relationships:  Siblings Lives with:  Facility Resident Do you feel safe going back to the place where you live?  Yes Need for family participation in patient care:  Yes (Comment)  Care giving concerns:  Pt is long term resident at ALF.    Social Worker assessment / plan:  CSW met with pt and pt's siblings, [name] and Ken at bedside. Pt has down syndrome and has been a resident at [facility] for about 10 years. Family is involved and supportive. Pt to have thoracentesis today and possibly d/c tomorrow per MD. Pt reports that she loves [facility] and has a lot of friends there. Family also request return. Per Tammy at facility, pt ambulates with a walker. She requires assist with bathing and dressing. Okay for return. Pt's siblings asked about HCPOA, but do not feel that pt could complete this. CSW explained that they could pursue guardianship if desired, but they are comfortable as they are next of kin.   Employment status:  Disabled (Comment on whether or not currently receiving Disability) Insurance information:  Medicare PT Recommendations:  Not assessed at this time Information / Referral to community resources:  Other (Comment Required) (return to  [facility])  Patient/Family's Response to care:  Pt and family request return to [facility] when medically stable.   Patient/Family's Understanding of and Emotional Response to Diagnosis, Current Treatment, and Prognosis:  Pt's siblings appear to be knowledgeable about pt's health history and admission diagnosis.   Emotional Assessment Appearance:  Appears stated age Attitude/Demeanor/Rapport:  Other (Cooperative) Affect (typically observed):  Appropriate Orientation:  Oriented to Self, Oriented to Place Alcohol / Substance use:  Not Applicable Psych involvement (Current and /or in the community):  No (Comment)  Discharge Needs  Concerns to be addressed:  Discharge Planning Concerns Readmission within the last 30 days:  No Current discharge risk:  Cognitively Impaired Barriers to Discharge:  Continued Medical Work up   ,  [place], LCSW 12/09/2015, 10:19 AM [phone] 

## 2015-12-09 NOTE — Progress Notes (Signed)
Thoracentesis complete no signs of distress. 1500 ml yellow colored ascites removed.  

## 2015-12-09 NOTE — Progress Notes (Signed)
ANTICOAGULATION CONSULT NOTE - follow up  Pharmacy Consult for Coumadin (chronic Rx PTA) Indication: atrial fibrillation  No Known Allergies  Patient Measurements: Height: 5\' 5"  (165.1 cm) Weight: 150 lb 3.2 oz (68.13 kg) IBW/kg (Calculated) : 57  Vital Signs: Temp: 98.5 F (36.9 C) (01/16 0522) Temp Source: Oral (01/16 0522) BP: 133/68 mmHg (01/16 0522)  Labs:  Recent Labs  12/07/15 0752 12/08/15 0600 12/09/15 0533  HGB 15.4*  --   --   HCT 46.9*  --   --   PLT 142*  --   --   LABPROT 35.8* 28.7* 24.1*  INR 3.69* 2.75* 2.19*  CREATININE 0.47 0.52 0.55  TROPONINI 0.03  --   --    Estimated Creatinine Clearance: 58.9 mL/min (by C-G formula based on Cr of 0.55).   Medical History: Past Medical History  Diagnosis Date  . Down syndrome   . Hypertension   . CHF (congestive heart failure) (HCC)   . Atrial fibrillation (HCC)   . Pneumonia   . Pulmonary embolism (HCC)   . Leg fracture   . Systolic CHF (HCC)   . Shortness of breath    Medications:  Prescriptions prior to admission  Medication Sig Dispense Refill Last Dose  . alendronate (FOSAMAX) 70 MG tablet Take 70 mg by mouth once a week. Takes on Mondays. Take with a full glass of water on an empty stomach.   12/02/2015  . Calcium Carbonate (CALCIUM 600 PO) Take 600 mg by mouth daily.   12/06/2015 at Unknown time  . digoxin (LANOXIN) 0.25 MG tablet Take 250 mcg by mouth daily.   12/06/2015 at Unknown time  . diltiazem (CARDIZEM) 120 MG tablet Take 120 mg by mouth 3 (three) times daily.   12/06/2015 at Unknown time  . furosemide (LASIX) 40 MG tablet Take 40 mg by mouth daily.    12/06/2015 at Unknown time  . glipiZIDE (GLUCOTROL) 5 MG tablet Take 5 mg by mouth 2 (two) times daily before a meal.   12/06/2015 at Unknown time  . lisinopril (PRINIVIL,ZESTRIL) 5 MG tablet Take 5 mg by mouth daily.   12/06/2015 at Unknown time  . metFORMIN (GLUCOPHAGE-XR) 500 MG 24 hr tablet Take 500 mg by mouth daily.    12/06/2015 at Unknown  time  . metoprolol tartrate (LOPRESSOR) 25 MG tablet Take 25 mg by mouth 2 (two) times daily.   12/06/2015 at 2000  . triamcinolone cream (KENALOG) 0.1 % Apply 1 application topically 2 (two) times daily.   12/06/2015 at Unknown time  . Vitamin D, Cholecalciferol, 400 UNITS TABS Take 400 Units by mouth daily.   12/06/2015 at Unknown time  . warfarin (COUMADIN) 2 MG tablet Take 1-2 tablets by mouth See admin instructions. Take 1 tablet by mouth on Tuesday, Thursday and Saturday.  Take (1/2) tablet by mouth on Sunday, Monday, Wednesday, and Friday.   12/06/2015 at 1700  . warfarin (COUMADIN) 6 MG tablet Take 6 mg by mouth every evening.   12/06/2015 at 1700   Assessment: INR elevated > 3 on admission but has now trended down to therapeutic range.   Home dose listed above (may need adjustment).  Goal of Therapy:  INR 2-3 Monitor platelets by anticoagulation protocol: Yes   Plan:  Coumadin 6mg  today x 1 INR daily  Arley Salamone A 12/09/2015,9:24 AM

## 2015-12-09 NOTE — Progress Notes (Signed)
[facility] PROGRESS NOTE  Angel Torres WUJ:[id number] DOB: [birth year] DOA: 12/07/2015 PCP: Kirk RuthsMCGOUGH,WILLIAM M, MD  Assessment/Plan: Large Right Pleural Effusion -For thoracentesis today. -Suspect related to CHF.  Acute Systolic CHF -Continue lasix, strive for negative fluid balance. 2.7 L negative since admission. -ECHO: Study Conclusions  - Left ventricle: The cavity size was normal. There was mild concentric hypertrophy. The estimated ejection fraction was 48%. Diffuse hypokinesis. The study is not technically sufficient to allow evaluation of LV diastolic function. Ejection fraction (MOD, 2-plane): 48%.  A Fib -Rate controlled. -Anticoagulated on coumadin.  Essential HTN -Well controlled.  Down Syndrome -Noted  Code Status: Full Code Family Communication: Discussed with sister Angel Torres at bedside  Disposition Plan: Back to ALF when ready; likely 24 hours   Consultants:  none   Antibiotics:  one   Subjective: Still with some WOB, pleasant  Objective: Filed Vitals:   12/08/15 1949 12/09/15 0500 12/09/15 0522 12/09/15 0819  BP: 150/92  133/68   Pulse: 108     Temp: 98.2 F (36.8 C)  98.5 F (36.9 C)   TempSrc: Oral  Oral   Resp: 22  22   Height:      Weight:  68.13 kg (150 lb 3.2 oz)    SpO2: 98%  94% 96%    Intake/Output Summary (Last 24 hours) at 12/09/15 1035 Last data filed at 12/09/15 1010  Gross per 24 hour  Intake    720 ml  Output    800 ml  Net    -80 ml   Filed Weights   12/07/15 1214 12/08/15 0354 12/09/15 0500  Weight: 72.5 kg (159 lb 13.3 oz) 69.99 kg (154 lb 4.8 oz) 68.13 kg (150 lb 3.2 oz)    Exam:   General:  awake  Cardiovascular: RRR  Respiratory: decreased BS right lung fields, crackles bilaterally  Abdomen: S/NT/ND/+BS  Extremities: no C/C/E   Neurologic:  Non-focal  Data Reviewed: Basic Metabolic Panel:  Recent Labs Lab 12/07/15 0752 12/08/15 0600 12/08/15 0707 12/09/15 0533  NA  136 137  --  137  K 4.1 3.8  --  4.1  CL 98* 99*  --  99*  CO2 27 28  --  29  GLUCOSE 130* 93  --  86  BUN 18 15  --  20  CREATININE 0.47 0.52  --  0.55  CALCIUM 9.3 8.9  --  8.7*  MG  --   --  1.9  --    Liver Function Tests:  Recent Labs Lab 12/07/15 0752  AST 29  ALT 23  ALKPHOS 118  BILITOT 1.1  PROT 7.9  ALBUMIN 4.3   No results for input(s): LIPASE, AMYLASE in the last 168 hours. No results for input(s): AMMONIA in the last 168 hours. CBC:  Recent Labs Lab 12/07/15 0752  WBC 8.1  NEUTROABS 6.1  HGB 15.4*  HCT 46.9*  MCV 88.2  PLT 142*   Cardiac Enzymes:  Recent Labs Lab 12/07/15 0752  TROPONINI 0.03   BNP (last 3 results)  Recent Labs  12/07/15 0752  BNP 760.0*    ProBNP (last 3 results) No results for input(s): PROBNP in the last 8760 hours.  CBG: No results for input(s): GLUCAP in the last 168 hours.  No results found for this or any previous visit (from the past 240 hour(s)).   Studies: No results found.  Scheduled Meds: . digoxin  250 mcg Oral Daily  . furosemide  20 mg Intravenous Q12H  .  lisinopril  5 mg Oral Daily  . metoprolol tartrate  25 mg Oral BID  . sodium chloride  3 mL Intravenous Q12H  . warfarin  6 mg Oral Once  . Warfarin - Pharmacist Dosing Inpatient   Does not apply Q24H   Continuous Infusions:   Active Problems:   Pleural effusion, right   Acute diastolic heart failure (HCC)   Atrial fibrillation (HCC)   Down's syndrome   Essential hypertension    Time spent: 25 minutes. Greater than 50% of this time was spent in direct contact with the patient coordinating care.    Chaya Jan  [facility] Pager [phone]  If 7PM-7AM, please contact night-coverage at [email or web address], password [facility] 12/09/2015, 10:35 AM  LOS: 2 days

## 2015-12-10 DIAGNOSIS — I481 Persistent atrial fibrillation: Secondary | ICD-10-CM

## 2015-12-10 LAB — BASIC METABOLIC PANEL
ANION GAP: 8 (ref 5–15)
BUN: 19 mg/dL (ref 6–20)
CALCIUM: 8.8 mg/dL — AB (ref 8.9–10.3)
CO2: 30 mmol/L (ref 22–32)
CREATININE: 0.5 mg/dL (ref 0.44–1.00)
Chloride: 100 mmol/L — ABNORMAL LOW (ref 101–111)
Glucose, Bld: 86 mg/dL (ref 65–99)
Potassium: 4.1 mmol/L (ref 3.5–5.1)
SODIUM: 138 mmol/L (ref 135–145)

## 2015-12-10 LAB — PROTIME-INR
INR: 2.1 — AB (ref 0.00–1.49)
PROTHROMBIN TIME: 23.4 s — AB (ref 11.6–15.2)

## 2015-12-10 MED ORDER — WARFARIN SODIUM 5 MG PO TABS: 7.5000 mg | ORAL_TABLET | Freq: Once | ORAL | Status: DC

## 2015-12-10 NOTE — NC FL2 (Signed)
[place] MEDICAID [id number] LEVEL OF CARE SCREENING TOOL     IDENTIFICATION  Patient Name: Angel Torres Birthdate: [birth year] Sex: female Admission Date (Current Location): 12/07/2015  [place] and [place] Number:  Aaron Edelman [id number] Facility and Address:  [facility],  [address] [place] [address]      Provider Number: [id number]  Attending Physician Name and Address:  Micael Hampshire Acost*  Relative Name and Phone Number:       Current Level of Care: Hospital Recommended Level of Care: Assisted Living Facility Prior Approval Number:    Date Approved/Denied:   PASRR Number:    Discharge Plan: Other (Comment) (ALF)    Current Diagnoses: Patient Active Problem List   Diagnosis Date Noted  . Pleural effusion, right 12/07/2015  . Acute diastolic heart failure (HCC) 12/07/2015  . Atrial fibrillation (HCC) 12/07/2015  . Down's syndrome 12/07/2015  . Essential hypertension 12/07/2015    Orientation RESPIRATION BLADDER Height & Weight    Self, Place  Normal Incontinent  (165.1 cm) 150 lbs.  BEHAVIORAL SYMPTOMS/MOOD NEUROLOGICAL BOWEL NUTRITION STATUS  Other (Comment) (n/a)  (n/a) Continent Diet (Low sodium heart healthy)  AMBULATORY STATUS COMMUNICATION OF NEEDS Skin   Limited Assist Verbally Bruising                       Personal Care Assistance Level of Assistance  Bathing, Feeding, Dressing Bathing Assistance: Limited assistance Feeding assistance: Limited assistance Dressing Assistance: Limited assistance     Functional Limitations Info  Sight, Hearing, Speech Sight Info: Adequate Hearing Info: Adequate Speech Info: Adequate    SPECIAL CARE FACTORS FREQUENCY                       Contractures      Additional Factors Info  Code Status, Allergies Code Status Info: Full code Allergies Info: No known allergies           Current Medications (12/10/2015):  This is the current hospital active medication  list Current Facility-Administered Medications  Medication Dose Route Frequency Provider Last Rate Last Dose  . 0.9 %  sodium chloride infusion  250 mL Intravenous PRN Hollice Espy, MD      . acetaminophen (TYLENOL) tablet 650 mg  650 mg Oral Q4H PRN Hollice Espy, MD      . digoxin (LANOXIN) tablet 250 mcg  250 mcg Oral Daily Hollice Espy, MD   250 mcg at 12/10/15 0934  . furosemide (LASIX) injection 20 mg  20 mg Intravenous Q12H Hollice Espy, MD   20 mg at 12/10/15 0934  . lisinopril (PRINIVIL,ZESTRIL) tablet 5 mg  5 mg Oral Daily Hollice Espy, MD   5 mg at 12/10/15 0934  . metoprolol tartrate (LOPRESSOR) tablet 25 mg  25 mg Oral BID Hollice Espy, MD   25 mg at 12/10/15 0934  . ondansetron (ZOFRAN) injection 4 mg  4 mg Intravenous Q6H PRN Hollice Espy, MD      . sodium chloride 0.9 % injection 3 mL  3 mL Intravenous Q12H Hollice Espy, MD   3 mL at 12/09/15 2134  . sodium chloride 0.9 % injection 3 mL  3 mL Intravenous PRN Hollice Espy, MD      . warfarin (COUMADIN) tablet 7.5 mg  7.5 mg Oral Once Henderson Cloud, MD      . Warfarin - Pharmacist Dosing Inpatient   Does not  apply Q24H Hollice Espy, MD         Discharge Medications: Medication List    TAKE these medications       alendronate 70 MG tablet  Commonly known as: FOSAMAX  Take 70 mg by mouth once a week. Takes on Mondays. Take with a full glass of water on an empty stomach.     CALCIUM 600 PO  Take 600 mg by mouth daily.     digoxin 0.25 MG tablet  Commonly known as: LANOXIN  Take 250 mcg by mouth daily.     diltiazem 120 MG tablet  Commonly known as: CARDIZEM  Take 120 mg by mouth 3 (three) times daily.     furosemide 40 MG tablet  Commonly known as: LASIX  Take 40 mg by mouth daily.     glipiZIDE 5 MG tablet  Commonly known as: GLUCOTROL  Take 5 mg by mouth 2 (two) times daily before a meal.     lisinopril 5 MG  tablet  Commonly known as: PRINIVIL,ZESTRIL  Take 5 mg by mouth daily.     metFORMIN 500 MG 24 hr tablet  Commonly known as: GLUCOPHAGE-XR  Take 500 mg by mouth daily.     metoprolol tartrate 25 MG tablet  Commonly known as: LOPRESSOR  Take 25 mg by mouth 2 (two) times daily.     triamcinolone cream 0.1 %  Commonly known as: KENALOG  Apply 1 application topically 2 (two) times daily.     Vitamin D (Cholecalciferol) 400 units Tabs  Take 400 Units by mouth daily.     warfarin 6 MG tablet  Commonly known as: COUMADIN  Take 6 mg by mouth every evening.     warfarin 2 MG tablet  Commonly known as: COUMADIN  Take 1-2 tablets by mouth See admin instructions. Take 1 tablet by mouth on Tuesday, Thursday and Saturday. Take (1/2) tablet by mouth on Sunday, Monday, Wednesday, and Friday.             Relevant Imaging Results:  Relevant Lab Results:   Additional Information SS#: [id number]  [place], [place] [phone]

## 2015-12-10 NOTE — Care Management Important Message (Signed)
Important Message  Patient Details  Name: Angel Torres MRN: [id number] Date of Birth: February 01, 1946   Medicare Important Message Given:  Yes    Malcolm Metro, RN 12/10/2015, 11:49 AM

## 2015-12-10 NOTE — Clinical Social Work Note (Signed)
Pt d/c today back to [facility]. Pt, siblings, and facility aware and agreeable. Facility to provide transport.  Derenda Fennel, LCSW [phone]

## 2015-12-10 NOTE — Care Management Note (Signed)
Case Management Note  Patient Details  Name: Angel Torres MRN: [id number] Date of Birth: November 05, 1946  Subjective/Objective:                  Pt is from [facility]. Pt admitted with pleural effusions. Pt planning to return to [facility]F, CSW is aware and will arrange for placement.   Action/Plan: No CM needs.   Expected Discharge Date:     12/10/2015             Expected Discharge Plan:  Assisted Living / Rest Home  In-House Referral:  Clinical Social Work  Discharge planning Services  CM Consult  Post Acute Care Choice:  NA Choice offered to:  NA  DME Arranged:    DME Agency:     HH Arranged:    HH Agency:     Status of Service:  Completed, signed off  Medicare Important Message Given:  Yes Date Medicare IM Given:    Medicare IM give by:    Date Additional Medicare IM Given:    Additional Medicare Important Message give by:     If discussed at Long Length of Stay Meetings, dates discussed:    Additional Comments:  Malcolm Metro, RN 12/10/2015, 11:49 AM

## 2015-12-10 NOTE — Progress Notes (Signed)
ANTICOAGULATION CONSULT NOTE - follow up  Pharmacy Consult for Coumadin (chronic Rx PTA) Indication: atrial fibrillation  No Known Allergies  Patient Measurements: Height:  (165.1 cm) Weight: 147 lb 0.8 oz (66.7 kg) IBW/kg (Calculated) : 57  Vital Signs: Temp: 97.8 F (36.6 C) (01/17 0536) Temp Source: Oral (01/17 0536) BP: 110/88 mmHg (01/17 0536) Pulse Rate: 86 (01/17 0536)  Labs:  Recent Labs  12/08/15 0600 12/09/15 0533 12/10/15 0514  LABPROT 28.7* 24.1* 23.4*  INR 2.75* 2.19* 2.10*  CREATININE 0.52 0.55 0.50   Estimated Creatinine Clearance: 58.9 mL/min (by C-G formula based on Cr of 0.5).   Medical History: Past Medical History  Diagnosis Date  . Down syndrome   . Hypertension   . CHF (congestive heart failure) (HCC)   . Atrial fibrillation (HCC)   . Pneumonia   . Pulmonary embolism (HCC)   . Leg fracture   . Systolic CHF (HCC)   . Shortness of breath    Medications:  Prescriptions prior to admission  Medication Sig Dispense Refill Last Dose  . alendronate (FOSAMAX) 70 MG tablet Take 70 mg by mouth once a week. Takes on Mondays. Take with a full glass of water on an empty stomach.   12/02/2015  . Calcium Carbonate (CALCIUM 600 PO) Take 600 mg by mouth daily.   12/06/2015 at Unknown time  . digoxin (LANOXIN) 0.25 MG tablet Take 250 mcg by mouth daily.   12/06/2015 at Unknown time  . diltiazem (CARDIZEM) 120 MG tablet Take 120 mg by mouth 3 (three) times daily.   12/06/2015 at Unknown time  . furosemide (LASIX) 40 MG tablet Take 40 mg by mouth daily.    12/06/2015 at Unknown time  . glipiZIDE (GLUCOTROL) 5 MG tablet Take 5 mg by mouth 2 (two) times daily before a meal.   12/06/2015 at Unknown time  . lisinopril (PRINIVIL,ZESTRIL) 5 MG tablet Take 5 mg by mouth daily.   12/06/2015 at Unknown time  . metFORMIN (GLUCOPHAGE-XR) 500 MG 24 hr tablet Take 500 mg by mouth daily.    12/06/2015 at Unknown time  . metoprolol tartrate (LOPRESSOR) 25 MG tablet Take 25 mg  by mouth 2 (two) times daily.   12/06/2015 at 2000  . triamcinolone cream (KENALOG) 0.1 % Apply 1 application topically 2 (two) times daily.   12/06/2015 at Unknown time  . Vitamin D, Cholecalciferol, 400 UNITS TABS Take 400 Units by mouth daily.   12/06/2015 at Unknown time  . warfarin (COUMADIN) 2 MG tablet Take 1-2 tablets by mouth See admin instructions. Take 1 tablet by mouth on Tuesday, Thursday and Saturday.  Take (1/2) tablet by mouth on Sunday, Monday, Wednesday, and Friday.   12/06/2015 at 1700  . warfarin (COUMADIN) 6 MG tablet Take 6 mg by mouth every evening.   12/06/2015 at 1700   Assessment: INR elevated > 3 on admission but has now trended down to 2.1 today Home dose 6 mg daily  Goal of Therapy:  INR 2-3 Monitor platelets by anticoagulation protocol: Yes   Plan:  Coumadin 7.5mg  today x 1 INR daily Thanks for allowing pharmacy to be a part of this patient's care.  Talbert Cage, PharmD Clinical Pharmacist 12/10/2015,9:09 AM

## 2015-12-10 NOTE — Discharge Summary (Signed)
Physician Discharge Summary  Angel Torres:[id number] DOB: June 12, 1946 DOA: 12/07/2015  PCP: Kirk Ruths, MD  Admit date: 12/07/2015 Discharge date: 12/10/2015  Time spent: 45 minutes  Recommendations for Outpatient Follow-up:  -Will be discharged back to ALF today. -Advised to follow-up with primary care provider in 2 weeks.   Discharge Diagnoses:  Active Problems:   Pleural effusion, right   Acute diastolic heart failure (HCC)   Atrial fibrillation (HCC)   Down's syndrome   Essential hypertension   Discharge Condition: Stable and improved  Filed Weights   12/08/15 0354 12/09/15 0500 12/10/15 0536  Weight: 69.99 kg (154 lb 4.8 oz) 68.13 kg (150 lb 3.2 oz) 66.7 kg (147 lb 0.8 oz)    History of present illness:  Angel Torres is a 70 y.o. female  with past mental history of atrial fibrillation on chronic Coumadin and diastolic CHF who also has a history of Down syndrome and stays at a nursing facility. Family noted that she was quite dyspneic on exertion, worse than a few weeks ago when they saw her Christmas time. They became concerned so took patient to the emergency room. Patient they are noted to have oxygen saturations in the 80s on room air and 95% with 2 L nasal cannula. Labs noted BNP at 760 him otherwise rest of labs normal. Hospitalist called for further evaluation and admission   Hospital Course:   Large Right Pleural Effusion -Status post thoracentesis on 1/16 with 1500 mL removed. -Markedly improved symptomatically, not with any current oxygen requirements. -Suspect related to CHF.  Acute Systolic CHF -Continue lasix, strive for negative fluid balance. 3.2 L negative since admission. -ECHO: Study Conclusions  - Left ventricle: The cavity size was normal. There was mild concentric hypertrophy. The estimated ejection fraction was 48%. Diffuse hypokinesis. The study is not technically sufficient to allow evaluation of LV diastolic  function. Ejection fraction (MOD, 2-plane): 48%.  A Fib -Rate controlled. -Anticoagulated on coumadin.  Essential HTN -Well controlled.  Down Syndrome -Noted  Procedures:  Thoracentesis   Consultations:  None  Discharge Instructions  Discharge Instructions    Diet - low sodium heart healthy    Complete by:  As directed      Increase activity slowly    Complete by:  As directed             Medication List    TAKE these medications        alendronate 70 MG tablet  Commonly known as:  FOSAMAX  Take 70 mg by mouth once a week. Takes on Mondays. Take with a full glass of water on an empty stomach.     CALCIUM 600 PO  Take 600 mg by mouth daily.     digoxin 0.25 MG tablet  Commonly known as:  LANOXIN  Take 250 mcg by mouth daily.     diltiazem 120 MG tablet  Commonly known as:  CARDIZEM  Take 120 mg by mouth 3 (three) times daily.     furosemide 40 MG tablet  Commonly known as:  LASIX  Take 40 mg by mouth daily.     glipiZIDE 5 MG tablet  Commonly known as:  GLUCOTROL  Take 5 mg by mouth 2 (two) times daily before a meal.     lisinopril 5 MG tablet  Commonly known as:  PRINIVIL,ZESTRIL  Take 5 mg by mouth daily.     metFORMIN 500 MG 24 hr tablet  Commonly known as:  GLUCOPHAGE-XR  Take 500 mg by mouth daily.     metoprolol tartrate 25 MG tablet  Commonly known as:  LOPRESSOR  Take 25 mg by mouth 2 (two) times daily.     triamcinolone cream 0.1 %  Commonly known as:  KENALOG  Apply 1 application topically 2 (two) times daily.     Vitamin D (Cholecalciferol) 400 units Tabs  Take 400 Units by mouth daily.     warfarin 6 MG tablet  Commonly known as:  COUMADIN  Take 6 mg by mouth every evening.     warfarin 2 MG tablet  Commonly known as:  COUMADIN  Take 1-2 tablets by mouth See admin instructions. Take 1 tablet by mouth on Tuesday, Thursday and Saturday.  Take (1/2) tablet by mouth on Sunday, Monday, Wednesday, and Friday.       No  Known Allergies     Follow-up Information    Follow up with Kirk Ruths, MD. Schedule an appointment as soon as possible for a visit in 2 weeks.   Specialty:  Family Medicine   Contact information:   [address] [place] [address] [phone]        The results of significant diagnostics from this hospitalization (including imaging, microbiology, ancillary and laboratory) are listed below for reference.    Significant Diagnostic Studies: Dg Chest 1 View  12/09/2015  CLINICAL DATA:  Status post right thoracentesis EXAM: CHEST 1 VIEW COMPARISON:  12/07/2015 chest radiograph. FINDINGS: Stable cardiomediastinal silhouette with mild cardiomegaly. No pneumothorax. Small residual right pleural effusion, significantly decreased. No left pleural effusion. Stable mild pulmonary edema. Improved aeration in the right lung with residual passive atelectasis at the right lung base. IMPRESSION: 1. No pneumothorax. 2. Small residual right pleural effusion, significantly decreased. 3. Improved aeration of the right lung, with residual passive atelectasis at the right lung base. 4. Stable mild congestive heart failure. Electronically Signed   By: Delbert Phenix M.D.   On: 12/09/2015 13:36   Dg Chest Portable 1 View  12/07/2015  CLINICAL DATA:  Short of breath EXAM: PORTABLE CHEST 1 VIEW COMPARISON:  04/17/2014 FINDINGS: Large right pleural effusion. Cardiomegaly. Vascular congestion is stable. No pneumothorax. IMPRESSION: New large right pleural effusion. Stable cardiomegaly and vascular congestion Electronically Signed   By: Jolaine Click M.D.   On: 12/07/2015 08:53   US Thoracentesis Asp Pleural Space W/img Guide  12/09/2015  CLINICAL DATA:  RIGHT pleural effusion, Down syndrome, hypertension, CHF, atrial fibrillation EXAM: ULTRASOUND GUIDED DIAGNOSTIC AND THERAPEUTIC THORACENTESIS COMPARISON:  Chest radiograph 12/07/2015 PROCEDURE: Procedure, benefits, and risks of procedure were discussed  with patient. Written informed consent for procedure was obtained from patient's family. Time out protocol followed. Pleural effusion localized by ultrasound at the posterior RIGHT hemithorax. Skin prepped and draped in usual sterile fashion. Skin and soft tissues anesthetized with 9 mL of 1% lidocaine. 8 French thoracentesis catheter placed into the RIGHT pleural space. 1500 mL of yellow colored RIGHT pleural fluid aspirated by syringe pump. Procedure tolerated well by patient without immediate complication. COMPLICATIONS: None FINDINGS: As above. Fluid did not appear loculated. IMPRESSION: Successful ultrasound guided RIGHT thoracentesis yielding 1500 mL of pleural fluid. Electronically Signed   By: Ulyses Southward M.D.   On: 12/09/2015 14:03    Microbiology: No results found for this or any previous visit (from the past 240 hour(s)).   Labs: Basic Metabolic Panel:  Recent Labs Lab 12/07/15 0752 12/08/15 0600 12/08/15 0707 12/09/15 0533 12/10/15 0514  NA 136 137  --  137 138  K 4.1 3.8  --  4.1 4.1  CL 98* 99*  --  99* 100*  CO2 27 28  --  29 30  GLUCOSE 130* 93  --  86 86  BUN 18 15  --  20 19  CREATININE 0.47 0.52  --  0.55 0.50  CALCIUM 9.3 8.9  --  8.7* 8.8*  MG  --   --  1.9  --   --    Liver Function Tests:  Recent Labs Lab 12/07/15 0752  AST 29  ALT 23  ALKPHOS 118  BILITOT 1.1  PROT 7.9  ALBUMIN 4.3   No results for input(s): LIPASE, AMYLASE in the last 168 hours. No results for input(s): AMMONIA in the last 168 hours. CBC:  Recent Labs Lab 12/07/15 0752  WBC 8.1  NEUTROABS 6.1  HGB 15.4*  HCT 46.9*  MCV 88.2  PLT 142*   Cardiac Enzymes:  Recent Labs Lab 12/07/15 0752  TROPONINI 0.03   BNP: BNP (last 3 results)  Recent Labs  12/07/15 0752  BNP 760.0*    ProBNP (last 3 results) No results for input(s): PROBNP in the last 8760 hours.  CBG: No results for input(s): GLUCAP in the last 168 hours.     SignedChaya Jan  [facility] Pager: [phone] 12/10/2015, 11:44 AM

## 2015-12-16 ENCOUNTER — Other Ambulatory Visit (HOSPITAL_COMMUNITY): Payer: Self-pay | Admitting: Family Medicine

## 2015-12-16 DIAGNOSIS — Z1231 Encounter for screening mammogram for malignant neoplasm of breast: Secondary | ICD-10-CM

## 2015-12-18 DIAGNOSIS — Z6827 Body mass index (BMI) 27.0-27.9, adult: Secondary | ICD-10-CM | POA: Diagnosis not present

## 2015-12-18 DIAGNOSIS — Q909 Down syndrome, unspecified: Secondary | ICD-10-CM | POA: Diagnosis not present

## 2015-12-18 DIAGNOSIS — E663 Overweight: Secondary | ICD-10-CM | POA: Diagnosis not present

## 2015-12-18 DIAGNOSIS — J90 Pleural effusion, not elsewhere classified: Secondary | ICD-10-CM | POA: Diagnosis not present

## 2015-12-18 DIAGNOSIS — Z1389 Encounter for screening for other disorder: Secondary | ICD-10-CM | POA: Diagnosis not present

## 2015-12-18 DIAGNOSIS — I5033 Acute on chronic diastolic (congestive) heart failure: Secondary | ICD-10-CM | POA: Diagnosis not present

## 2015-12-24 ENCOUNTER — Inpatient Hospital Stay (HOSPITAL_COMMUNITY)
Admission: EM | Admit: 2015-12-24 | Discharge: 2015-12-27 | DRG: 292 | Disposition: A | Discharge status: Home Health | Payer: Medicare Other | Attending: Internal Medicine | Admitting: Internal Medicine

## 2015-12-24 ENCOUNTER — Encounter (HOSPITAL_COMMUNITY): Payer: Self-pay | Admitting: Emergency Medicine

## 2015-12-24 ENCOUNTER — Emergency Department (HOSPITAL_COMMUNITY): Payer: Medicare Other

## 2015-12-24 DIAGNOSIS — I272 Other secondary pulmonary hypertension: Secondary | ICD-10-CM | POA: Diagnosis not present

## 2015-12-24 DIAGNOSIS — I10 Essential (primary) hypertension: Secondary | ICD-10-CM | POA: Diagnosis present

## 2015-12-24 DIAGNOSIS — I481 Persistent atrial fibrillation: Secondary | ICD-10-CM

## 2015-12-24 DIAGNOSIS — I110 Hypertensive heart disease with heart failure: Secondary | ICD-10-CM | POA: Diagnosis not present

## 2015-12-24 DIAGNOSIS — E118 Type 2 diabetes mellitus with unspecified complications: Secondary | ICD-10-CM

## 2015-12-24 DIAGNOSIS — L03115 Cellulitis of right lower limb: Secondary | ICD-10-CM | POA: Diagnosis not present

## 2015-12-24 DIAGNOSIS — Z7901 Long term (current) use of anticoagulants: Secondary | ICD-10-CM

## 2015-12-24 DIAGNOSIS — I5082 Biventricular heart failure: Secondary | ICD-10-CM

## 2015-12-24 DIAGNOSIS — I5033 Acute on chronic diastolic (congestive) heart failure: Secondary | ICD-10-CM

## 2015-12-24 DIAGNOSIS — R0682 Tachypnea, not elsewhere classified: Secondary | ICD-10-CM

## 2015-12-24 DIAGNOSIS — Q909 Down syndrome, unspecified: Secondary | ICD-10-CM

## 2015-12-24 DIAGNOSIS — J90 Pleural effusion, not elsewhere classified: Secondary | ICD-10-CM | POA: Diagnosis not present

## 2015-12-24 DIAGNOSIS — I482 Chronic atrial fibrillation, unspecified: Secondary | ICD-10-CM | POA: Diagnosis present

## 2015-12-24 DIAGNOSIS — Z86711 Personal history of pulmonary embolism: Secondary | ICD-10-CM

## 2015-12-24 DIAGNOSIS — M79604 Pain in right leg: Secondary | ICD-10-CM | POA: Diagnosis not present

## 2015-12-24 DIAGNOSIS — Z7984 Long term (current) use of oral hypoglycemic drugs: Secondary | ICD-10-CM

## 2015-12-24 DIAGNOSIS — E119 Type 2 diabetes mellitus without complications: Secondary | ICD-10-CM

## 2015-12-24 DIAGNOSIS — R0602 Shortness of breath: Secondary | ICD-10-CM | POA: Diagnosis not present

## 2015-12-24 DIAGNOSIS — R609 Edema, unspecified: Secondary | ICD-10-CM

## 2015-12-24 DIAGNOSIS — L03116 Cellulitis of left lower limb: Secondary | ICD-10-CM | POA: Diagnosis not present

## 2015-12-24 DIAGNOSIS — R600 Localized edema: Secondary | ICD-10-CM | POA: Diagnosis not present

## 2015-12-24 DIAGNOSIS — I5043 Acute on chronic combined systolic (congestive) and diastolic (congestive) heart failure: Secondary | ICD-10-CM | POA: Diagnosis present

## 2015-12-24 DIAGNOSIS — I5023 Acute on chronic systolic (congestive) heart failure: Secondary | ICD-10-CM | POA: Diagnosis present

## 2015-12-24 DIAGNOSIS — Z9889 Other specified postprocedural states: Secondary | ICD-10-CM

## 2015-12-24 HISTORY — DX: Pulmonary hypertension, unspecified: I27.20

## 2015-12-24 LAB — COMPREHENSIVE METABOLIC PANEL
ALBUMIN: 4.2 g/dL (ref 3.5–5.0)
ALT: 26 U/L (ref 14–54)
ANION GAP: 10 (ref 5–15)
AST: 33 U/L (ref 15–41)
Alkaline Phosphatase: 143 U/L — ABNORMAL HIGH (ref 38–126)
BUN: 17 mg/dL (ref 6–20)
CHLORIDE: 97 mmol/L — AB (ref 101–111)
CO2: 27 mmol/L (ref 22–32)
Calcium: 9.6 mg/dL (ref 8.9–10.3)
Creatinine, Ser: 0.78 mg/dL (ref 0.44–1.00)
GFR calc Af Amer: >60 mL/min (ref >60)
Glucose, Bld: 119 mg/dL — ABNORMAL HIGH (ref 65–99)
POTASSIUM: 4.3 mmol/L (ref 3.5–5.1)
Sodium: 134 mmol/L — ABNORMAL LOW (ref 135–145)
Total Bilirubin: 1.1 mg/dL (ref 0.3–1.2)
Total Protein: 7.5 g/dL (ref 6.5–8.1)

## 2015-12-24 LAB — CBC WITH DIFFERENTIAL/PLATELET
BASOS ABS: 0 10*3/uL (ref 0.0–0.1)
BASOS PCT: 1 %
Eosinophils Absolute: 0.1 10*3/uL (ref 0.0–0.7)
Eosinophils Relative: 2 %
HCT: 47.2 % — ABNORMAL HIGH (ref 36.0–46.0)
Hemoglobin: 15.9 g/dL — ABNORMAL HIGH (ref 12.0–15.0)
LYMPHS ABS: 1.2 10*3/uL (ref 0.7–4.0)
LYMPHS PCT: 16 %
MCH: 29.6 pg (ref 26.0–34.0)
MCHC: 33.7 g/dL (ref 30.0–36.0)
MCV: 87.7 fL (ref 78.0–100.0)
Monocytes Absolute: 0.5 10*3/uL (ref 0.1–1.0)
Monocytes Relative: 6 %
NEUTROS ABS: 5.7 10*3/uL (ref 1.7–7.7)
NEUTROS PCT: 75 %
PLATELETS: 156 10*3/uL (ref 150–400)
RBC: 5.38 MIL/uL — ABNORMAL HIGH (ref 3.87–5.11)
RDW: 14.7 % (ref 11.5–15.5)
WBC: 7.5 10*3/uL (ref 4.0–10.5)

## 2015-12-24 LAB — BRAIN NATRIURETIC PEPTIDE: B NATRIURETIC PEPTIDE 5: 563 pg/mL — AB (ref 0.0–100.0)

## 2015-12-24 LAB — DIGOXIN LEVEL: DIGOXIN LVL: 1.1 ng/mL (ref 0.8–2.0)

## 2015-12-24 LAB — TROPONIN I: TROPONIN I: 0.03 ng/mL (ref <0.031)

## 2015-12-24 LAB — I-STAT CG4 LACTIC ACID, ED: Lactic Acid, Venous: 1.95 mmol/L (ref 0.5–2.0)

## 2015-12-24 LAB — GLUCOSE, CAPILLARY: GLUCOSE-CAPILLARY: 110 mg/dL — AB (ref 65–99)

## 2015-12-24 LAB — PROTIME-INR
INR: 2.84 — AB (ref 0.00–1.49)
PROTHROMBIN TIME: 29.4 s — AB (ref 11.6–15.2)

## 2015-12-24 LAB — CBG MONITORING, ED: GLUCOSE-CAPILLARY: 141 mg/dL — AB (ref 65–99)

## 2015-12-24 MED ORDER — INSULIN ASPART 100 UNIT/ML ~~LOC~~ SOLN
0.0000 [IU] | Freq: Three times a day (TID) | SUBCUTANEOUS | Status: DC
  Administered 2015-12-25: 2 [IU] via SUBCUTANEOUS
  Administered 2015-12-27: 3 [IU] via SUBCUTANEOUS

## 2015-12-24 MED ORDER — SODIUM CHLORIDE 0.9% FLUSH: 3.0000 mL | INTRAVENOUS | Status: DC | PRN

## 2015-12-24 MED ORDER — DILTIAZEM HCL 60 MG PO TABS
120.0000 mg | ORAL_TABLET | Freq: Three times a day (TID) | ORAL | Status: DC
  Administered 2015-12-24 – 2015-12-25 (×2): 120 mg via ORAL
  Filled 2015-12-24 (×2): qty 2

## 2015-12-24 MED ORDER — ONDANSETRON HCL 4 MG/2ML IJ SOLN: 4.0000 mg | Freq: Four times a day (QID) | INTRAMUSCULAR | Status: DC | PRN

## 2015-12-24 MED ORDER — DIGOXIN 125 MCG PO TABS
250.0000 ug | ORAL_TABLET | Freq: Every day | ORAL | Status: DC
  Administered 2015-12-25 – 2015-12-27 (×3): 250 ug via ORAL
  Filled 2015-12-24 (×3): qty 2
  Filled 2015-12-24: qty 1

## 2015-12-24 MED ORDER — LISINOPRIL 5 MG PO TABS: 5.0000 mg | ORAL_TABLET | Freq: Every day | ORAL | Status: DC

## 2015-12-24 MED ORDER — FUROSEMIDE 10 MG/ML IJ SOLN
20.0000 mg | Freq: Once | INTRAMUSCULAR | Status: AC
  Administered 2015-12-24: 20 mg via INTRAVENOUS
  Filled 2015-12-24: qty 2

## 2015-12-24 MED ORDER — SODIUM CHLORIDE 0.9% FLUSH
3.0000 mL | Freq: Two times a day (BID) | INTRAVENOUS | Status: DC
  Administered 2015-12-24 – 2015-12-27 (×6): 3 mL via INTRAVENOUS

## 2015-12-24 MED ORDER — SODIUM CHLORIDE 0.9 % IV SOLN: 250.0000 mL | INTRAVENOUS | Status: DC | PRN

## 2015-12-24 MED ORDER — LISINOPRIL 5 MG PO TABS
5.0000 mg | ORAL_TABLET | Freq: Every day | ORAL | Status: DC
  Administered 2015-12-25 – 2015-12-27 (×3): 5 mg via ORAL
  Filled 2015-12-24 (×3): qty 1

## 2015-12-24 MED ORDER — WARFARIN - PHARMACIST DOSING INPATIENT
Status: DC
  Administered 2015-12-25: 16:00:00

## 2015-12-24 MED ORDER — FUROSEMIDE 40 MG PO TABS
60.0000 mg | ORAL_TABLET | Freq: Every day | ORAL | Status: DC
  Administered 2015-12-25: 60 mg via ORAL
  Filled 2015-12-24: qty 1

## 2015-12-24 MED ORDER — METOPROLOL TARTRATE 25 MG PO TABS
25.0000 mg | ORAL_TABLET | Freq: Two times a day (BID) | ORAL | Status: DC
  Administered 2015-12-24 – 2015-12-27 (×6): 25 mg via ORAL
  Filled 2015-12-24 (×6): qty 1

## 2015-12-24 MED ORDER — WARFARIN SODIUM 5 MG PO TABS
6.0000 mg | ORAL_TABLET | Freq: Once | ORAL | Status: AC
  Administered 2015-12-25: 6 mg via ORAL
  Filled 2015-12-24: qty 1

## 2015-12-24 MED ORDER — ACETAMINOPHEN 325 MG PO TABS: 650.0000 mg | ORAL_TABLET | ORAL | Status: DC | PRN

## 2015-12-24 MED ORDER — INSULIN ASPART 100 UNIT/ML ~~LOC~~ SOLN: 0.0000 [IU] | Freq: Every day | SUBCUTANEOUS | Status: DC

## 2015-12-24 NOTE — ED Notes (Signed)
Patient ambulated in hallway with walker. Patient used walker with standby assist. Steady. Oxygen saturations started and remained 92 % on RA.

## 2015-12-24 NOTE — Progress Notes (Addendum)
Nurse calling from an assisted living facility, demanding information about pt. Explained to nurse that we cannot give out information over the phone due to HIPPA. Offered to let assisted living facility nurse speak with charge nurse, assisted living facility nurse cursed at nurse and hung up the phone.

## 2015-12-24 NOTE — ED Notes (Signed)
PT c/o bilateral leg swelling and pain worsening x2 weeks. Bilateral lower legs noted with edema and redness.

## 2015-12-24 NOTE — Progress Notes (Signed)
ANTICOAGULATION CONSULT NOTE - Initial Consult  Pharmacy Consult for Warfarin Indication: atrial fibrillation  No Known Allergies  Patient Measurements: Height:  (165.1 cm) Weight: 150 lb 5.7 oz (68.2 kg) IBW/kg (Calculated) : 57   Vital Signs: Temp: 98.1 F (36.7 C) (01/31 2113) Temp Source: Oral (01/31 2113) BP: 141/80 mmHg (01/31 2113) Pulse Rate: 82 (01/31 2113)  Labs:  Recent Labs  12/24/15 1700  HGB 15.9*  HCT 47.2*  PLT 156  LABPROT 29.4*  INR 2.84*  CREATININE 0.78  TROPONINI 0.03    Estimated Creatinine Clearance: 58.9 mL/min (by C-G formula based on Cr of 0.78).   Medical History: Past Medical History  Diagnosis Date  . Down syndrome   . Hypertension   . CHF (congestive heart failure) (HCC)   . Atrial fibrillation (HCC)   . Pneumonia   . Pulmonary embolism (HCC)   . Leg fracture   . Systolic CHF (HCC)   . Shortness of breath     Medications:  Prescriptions prior to admission  Medication Sig Dispense Refill Last Dose  . alendronate (FOSAMAX) 70 MG tablet Take 70 mg by mouth once a week. Takes on Mondays. Take with a full glass of water on an empty stomach.   12/23/2015 at Unknown time  . betamethasone dipropionate (DIPROLENE) 0.05 % cream Apply 1 application topically 2 (two) times daily as needed (for itching).   unknown  . Calcium Carbonate (CALCIUM 600 PO) Take 600 mg by mouth daily.   12/24/2015 at 800a  . digoxin (LANOXIN) 0.25 MG tablet Take 250 mcg by mouth daily.   12/24/2015 at 800a  . diltiazem (CARDIZEM) 120 MG tablet Take 120 mg by mouth 3 (three) times daily.   12/24/2015 at 1400  . furosemide (LASIX) 40 MG tablet Take 40 mg by mouth daily.    12/24/2015 at 800a  . glipiZIDE (GLUCOTROL) 5 MG tablet Take 5 mg by mouth 2 (two) times daily before a meal.   12/23/2015 at 1700  . lisinopril (PRINIVIL,ZESTRIL) 5 MG tablet Take 5 mg by mouth daily.   12/24/2015 at 800a  . metFORMIN (GLUCOPHAGE-XR) 500 MG 24 hr tablet Take 500 mg by mouth  daily.    12/24/2015 at 800a  . metoprolol tartrate (LOPRESSOR) 25 MG tablet Take 25 mg by mouth 2 (two) times daily.   12/24/2015 at 800a  . triamcinolone cream (KENALOG) 0.1 % Apply 1 application topically 2 (two) times daily.   12/23/2015 at 2000  . Vitamin D, Cholecalciferol, 400 UNITS TABS Take 400 Units by mouth daily.   12/24/2015 at 800  . warfarin (COUMADIN) 2 MG tablet Take 1-2 tablets by mouth See admin instructions. Take 1 tablet by mouth on Tuesday, Thursday and Saturday.  Take (1/2) tablet by mouth on Sunday, Monday, Wednesday, and Friday.   12/15/2015 at Unknown time  . warfarin (COUMADIN) 6 MG tablet Take 6 mg by mouth every evening.   12/23/2015 at 1700    Assessment: Okay for Protocol, INR at goal.  No bleeding noted.  right-sided pleural effusion noted.  Goal of Therapy:  INR 2-3   Plan:  Warfarin  PO x 1 tonight. Daily PT/INR Monitor for signs and symptoms of bleeding.   Lamonte Richer R 12/24/2015,10:59 PM

## 2015-12-24 NOTE — ED Provider Notes (Signed)
CSN: [id number]     Arrival date & time 12/24/15  1542 History   First MD Initiated Contact with Patient 12/24/15 1611     Chief Complaint  Patient presents with  . Leg Swelling      The history is provided by the patient, the nursing home and the EMS personnel. The history is limited by the condition of the patient and a developmental delay.  Pt was seen at 1625. Per NH report and pt:  Pt states she is here "because my legs are swollen." Pt has hx of developmental delay and cannot give further details.    Past Medical History  Diagnosis Date  . Down syndrome   . Hypertension   . CHF (congestive heart failure) (HCC)   . Atrial fibrillation (HCC)   . Pneumonia   . Pulmonary embolism (HCC)   . Leg fracture   . Systolic CHF (HCC)   . Shortness of breath    Past Surgical History  Procedure Laterality Date  . Thoracentesis Right 12/09/2015    Social History  Substance Use Topics  . Smoking status: Never Smoker   . Smokeless tobacco: None  . Alcohol Use: No    Review of Systems  Unable to perform ROS: Other      Allergies  Review of patient's allergies indicates no known allergies.  Home Medications   Prior to Admission medications   Medication Sig Start Date End Date Taking? Authorizing Provider  Calcium Carbonate (CALCIUM 600 PO) Take 600 mg by mouth daily.   Yes Historical Provider, MD  furosemide (LASIX) 40 MG tablet Take 40 mg by mouth daily.    Yes Historical Provider, MD  lisinopril (PRINIVIL,ZESTRIL) 5 MG tablet Take 5 mg by mouth daily.   Yes Historical Provider, MD  metoprolol tartrate (LOPRESSOR) 25 MG tablet Take 25 mg by mouth 2 (two) times daily.   Yes Historical Provider, MD  alendronate (FOSAMAX) 70 MG tablet Take 70 mg by mouth once a week. Takes on Mondays. Take with a full glass of water on an empty stomach.    Historical Provider, MD  digoxin (LANOXIN) 0.25 MG tablet Take 250 mcg by mouth daily.    Historical Provider, MD  diltiazem (CARDIZEM) 120  MG tablet Take 120 mg by mouth 3 (three) times daily.    Historical Provider, MD  glipiZIDE (GLUCOTROL) 5 MG tablet Take 5 mg by mouth 2 (two) times daily before a meal.    Historical Provider, MD  metFORMIN (GLUCOPHAGE-XR) 500 MG 24 hr tablet Take 500 mg by mouth daily.     Historical Provider, MD  triamcinolone cream (KENALOG) 0.1 % Apply 1 application topically 2 (two) times daily.    Historical Provider, MD  Vitamin D, Cholecalciferol, 400 UNITS TABS Take 400 Units by mouth daily.    Historical Provider, MD  warfarin (COUMADIN) 2 MG tablet Take 1-2 tablets by mouth See admin instructions. Take 1 tablet by mouth on Tuesday, Thursday and Saturday.  Take (1/2) tablet by mouth on Sunday, Monday, Wednesday, and Friday. 12/02/15   Historical Provider, MD  warfarin (COUMADIN) 6 MG tablet Take 6 mg by mouth every evening.    Historical Provider, MD   BP 136/72 mmHg  Pulse 91  Temp(Src) 98.8 F (37.1 C) (Tympanic)  Resp 18  Wt 155 lb (70.308 kg)  SpO2 98%    Physical Exam  1630: Physical examination:  Nursing notes reviewed; Vital signs and O2 SAT reviewed;  Constitutional: Well developed, Well nourished, In no  acute distress; Head:  Normocephalic, atraumatic; Eyes: EOMI, PERRL, No scleral icterus; ENMT: Mouth and pharynx normal, Mucous membranes dry;; Neck: Supple, Full range of motion, No lymphadenopathy; Cardiovascular: Regular rate and rhythm, No gallop; Respiratory: Breath sounds diminished right, coarse left, No wheezes.  Speaking phrases. Tachypneic. Sitting upright; Chest: Nontender, Movement normal; Abdomen: Soft, Nontender, Nondistended, Normal bowel sounds; Genitourinary: No CVA tenderness; Extremities: Pulses normal, No tenderness, +2 pedal edema, R>L with calf edema and asymmetry. +mild erythema R>L lower legs. No open wounds, no ecchymosis.; Neuro: Awake, alert. Baseline mental status per family at bedside. Major CN grossly intact.  Speech clear. No gross focal motor or sensory deficits in  extremities.; Skin: Color normal, Warm, Dry.   ED Course  Procedures (including critical care time) Labs Review  Imaging Review  I have personally reviewed and evaluated these images and lab results as part of my medical decision-making.   EKG Interpretation   Date/Time:  Tuesday December 24 2015 16:27:07 EST Ventricular Rate:  77 PR Interval:    QRS Duration: 107 QT Interval:  444 QTC Calculation: 502 R Axis:   -170 Text Interpretation:  Atrial fibrillation Premature ventricular complexes  Left posterior fasicular block Incomplete right bundle branch block  Prolonged QT interval Artifact When compared with ECG of 12/07/2015 QT has  lengthened Confirmed by [facility]  MD, Nicholos Johns [phone]) on 12/24/2015 4:40:03  PM      MDM  MDM Reviewed: previous chart, nursing note and vitals Reviewed previous: labs and ECG Interpretation: labs, ECG, x-ray and ultrasound     Results for orders placed or performed during the hospital encounter of 12/24/15  CBC with Differential  Result Value Ref Range   WBC 7.5 4.0 - 10.5 K/uL   RBC 5.38 (H) 3.87 - 5.11 MIL/uL   Hemoglobin 15.9 (H) 12.0 - 15.0 g/dL   HCT 81.1 (H) 91.4 - 78.2 %   MCV 87.7 78.0 - 100.0 fL   MCH 29.6 26.0 - 34.0 pg   MCHC 33.7 30.0 - 36.0 g/dL   RDW 95.6 21.3 - 08.6 %   Platelets 156 150 - 400 K/uL   Neutrophils Relative % 75 %   Neutro Abs 5.7 1.7 - 7.7 K/uL   Lymphocytes Relative 16 %   Lymphs Abs 1.2 0.7 - 4.0 K/uL   Monocytes Relative 6 %   Monocytes Absolute 0.5 0.1 - 1.0 K/uL   Eosinophils Relative 2 %   Eosinophils Absolute 0.1 0.0 - 0.7 K/uL   Basophils Relative 1 %   Basophils Absolute 0.0 0.0 - 0.1 K/uL  Comprehensive metabolic panel  Result Value Ref Range   Sodium 134 (L) 135 - 145 mmol/L   Potassium 4.3 3.5 - 5.1 mmol/L   Chloride 97 (L) 101 - 111 mmol/L   CO2 27 22 - 32 mmol/L   Glucose, Bld 119 (H) 65 - 99 mg/dL   BUN 17 6 - 20 mg/dL   Creatinine, Ser 5.78 0.44 - 1.00 mg/dL   Calcium 9.6 8.9 -  46.9 mg/dL   Total Protein 7.5 6.5 - 8.1 g/dL   Albumin 4.2 3.5 - 5.0 g/dL   AST 33 15 - 41 U/L   ALT 26 14 - 54 U/L   Alkaline Phosphatase 143 (H) 38 - 126 U/L   Total Bilirubin 1.1 0.3 - 1.2 mg/dL   GFR calc non Af Amer >60 >60 mL/min   GFR calc Af Amer >60 >60 mL/min   Anion gap 10 5 - 15  Brain natriuretic  peptide  Result Value Ref Range   B Natriuretic Peptide 563.0 (H) 0.0 - 100.0 pg/mL  Troponin I  Result Value Ref Range   Troponin I 0.03 <0.031 ng/mL  Digoxin level  Result Value Ref Range   Digoxin Level 1.1 0.8 - 2.0 ng/mL  Protime-INR  Result Value Ref Range   Prothrombin Time 29.4 (H) 11.6 - 15.2 seconds   INR 2.84 (H) 0.00 - 1.49  CBG monitoring, ED  Result Value Ref Range   Glucose-Capillary 141 (H) 65 - 99 mg/dL  I-Stat CG4 Lactic Acid, ED  Result Value Ref Range   Lactic Acid, Venous 1.95 0.5 - 2.0 mmol/L   Dg Chest 2 View 12/24/2015  CLINICAL DATA:  Leg swelling. History of CHF. Prior right thoracentesis. EXAM: CHEST  2 VIEW COMPARISON:  12/09/2015 FINDINGS: Increased densities throughout the right chest is compatible with a large pleural effusion. Again noted is enlargement of the cardiac silhouette. Subtle interstitial densities throughout both lungs may represent mild pulmonary edema. Again noted is a nodular density in the left lower chest most likely related to a nipple shadow. Degenerative changes near the thoracolumbar spine and cannot exclude a compression fracture in this area. IMPRESSION: Re-accumulation of right pleural fluid. There is a large right pleural effusion. Cardiomegaly and cannot exclude mild interstitial pulmonary edema. Electronically Signed   By: Richarda Overlie M.D.   On: 12/24/2015 17:43   US Venous Img Lower Unilateral Right 12/24/2015  CLINICAL DATA:  Right lower extremity pain and swelling for the past 2 weeks. History of pulmonary embolism. Evaluate for DVT EXAM: RIGHT LOWER EXTREMITY VENOUS DOPPLER ULTRASOUND TECHNIQUE: Gray-scale sonography  with graded compression, as well as color Doppler and duplex ultrasound were performed to evaluate the lower extremity deep venous systems from the level of the common femoral vein and including the common femoral, femoral, profunda femoral, popliteal and calf veins including the posterior tibial, peroneal and gastrocnemius veins when visible. The superficial great saphenous vein was also interrogated. Spectral Doppler was utilized to evaluate flow at rest and with distal augmentation maneuvers in the common femoral, femoral and popliteal veins. COMPARISON:  None. FINDINGS: Contralateral Common Femoral Vein: Respiratory phasicity is normal and symmetric with the symptomatic side. No evidence of thrombus. Normal compressibility. Common Femoral Vein: No evidence of thrombus. Normal compressibility, respiratory phasicity and response to augmentation. Saphenofemoral Junction: No evidence of thrombus. Normal compressibility and flow on color Doppler imaging. Profunda Femoral Vein: No evidence of thrombus. Normal compressibility and flow on color Doppler imaging. Femoral Vein: No evidence of thrombus. Normal compressibility, respiratory phasicity and response to augmentation. Popliteal Vein: No evidence of thrombus. Normal compressibility, respiratory phasicity and response to augmentation. Calf Veins: No evidence of thrombus. Normal compressibility and flow on color Doppler imaging. Superficial Great Saphenous Vein: No evidence of thrombus. Normal compressibility and flow on color Doppler imaging. Venous Reflux:  None. Other Findings:  None. IMPRESSION: No evidence of right lower extremity DVT. Electronically Signed   By: Simonne Come M.D.   On: 12/24/2015 17:49    1855:  Right pleural effusion re-accumulated. Pt tachypneic at rest, but not hypoxic. Pt's family requests pt be admitted overnight for drainage tomorrow. T/C to [facility]. Allena Katz, case discussed, including:  HPI, pertinent PM/SHx, VS/PE, dx testing, ED course  and treatment:  Agreeable to come to ED for evaluation for admission.   Samuel Jester, DO 12/28/15 0001

## 2015-12-24 NOTE — H&P (Addendum)
[facility] History and Physical   Patient: Angel Torres LAG:[id number]   PCP: Leonides Grills, MD DOB: 09-26-1946   DOA: 12/24/2015   DOS: 12/24/2015   DOS: the patient was seen and examined on 12/24/2015  Referring physician: Dr. Thurnell Garbe Chief Complaint: Shortness of breath  HPI: Angel Torres is a 70 y.o. female with Past medical history of Down syndrome, hypertension, diastolic dysfunction, atrial fibrillation, right-sided pleural effusion. Patient presented with complaints of off shortness of breath. She also had complaints of leg swelling. Patient is from an assisted living facility and today requested her nurse to evaluate her for leg swelling and was brought here for further workup. Due to her history of Down syndrome the history is limited from the patient. Patient's family who was at bedside mentions that the leg has been swollen more than her baseline and also has been more red than her baseline. She denies any pain in her leg. No chest pain and abdominal pain. Patient has been getting off her medications regularly.  The patient is coming from ALF.  At her baseline ambulates with support And is not independent for most of her ADL; does not manages her medication on her own.  Review of Systems: as mentioned in the history of present illness.  A comprehensive review of the other systems is negative.  Past Medical History  Diagnosis Date  . Down syndrome   . Hypertension   . CHF (congestive heart failure) ([place])   . Atrial fibrillation ([place])   . Pneumonia   . Pulmonary embolism ([place])   . Leg fracture   . Systolic CHF ([place])   . Shortness of breath    Past Surgical History  Procedure Laterality Date  . Thoracentesis Right 12/09/2015   Social History:  reports that she has never smoked. She does not have any smokeless tobacco history on file. She reports that she does not drink alcohol or use illicit drugs.  No Known Allergies  History reviewed. No  pertinent family history.  Prior to Admission medications   Medication Sig Start Date End Date Taking? Authorizing Provider  alendronate (FOSAMAX) 70 MG tablet Take 70 mg by mouth once a week. Takes on Mondays. Take with a full glass of water on an empty stomach.   Yes Historical Provider, MD  betamethasone dipropionate (DIPROLENE) 0.05 % cream Apply 1 application topically 2 (two) times daily as needed (for itching).   Yes Historical Provider, MD  Calcium Carbonate (CALCIUM 600 PO) Take 600 mg by mouth daily.   Yes Historical Provider, MD  digoxin (LANOXIN) 0.25 MG tablet Take 250 mcg by mouth daily.   Yes Historical Provider, MD  diltiazem (CARDIZEM) 120 MG tablet Take 120 mg by mouth 3 (three) times daily.   Yes Historical Provider, MD  furosemide (LASIX) 40 MG tablet Take 40 mg by mouth daily.    Yes Historical Provider, MD  glipiZIDE (GLUCOTROL) 5 MG tablet Take 5 mg by mouth 2 (two) times daily before a meal.   Yes Historical Provider, MD  lisinopril (PRINIVIL,ZESTRIL) 5 MG tablet Take 5 mg by mouth daily.   Yes Historical Provider, MD  metFORMIN (GLUCOPHAGE-XR) 500 MG 24 hr tablet Take 500 mg by mouth daily.    Yes Historical Provider, MD  metoprolol tartrate (LOPRESSOR) 25 MG tablet Take 25 mg by mouth 2 (two) times daily.   Yes Historical Provider, MD  triamcinolone cream (KENALOG) 0.1 % Apply 1 application topically 2 (two) times daily.   Yes Historical Provider,  MD  Vitamin D, Cholecalciferol, 400 UNITS TABS Take 400 Units by mouth daily.   Yes Historical Provider, MD  warfarin (COUMADIN) 2 MG tablet Take 1-2 tablets by mouth See admin instructions. Take 1 tablet by mouth on Tuesday, Thursday and Saturday.  Take (1/2) tablet by mouth on Sunday, Monday, Wednesday, and Friday. 12/02/15  Yes Historical Provider, MD  warfarin (COUMADIN) 6 MG tablet Take 6 mg by mouth every evening.   Yes Historical Provider, MD    Physical Exam: Filed Vitals:   12/24/15 1930 12/24/15 2030 12/24/15 2046  12/24/15 2113  BP: 154/84 146/70  141/80  Pulse: 93 89  82  Temp:   97.9 F (36.6 C) 98.1 F (36.7 C)  TempSrc:   Oral Oral  Resp: _0 Height:    5' 5" (1.651 m)  Weight:    68.2 kg (150 lb 5.7 oz)  SpO2: 94% 94%  93%    General: Alert, Awake and Oriented to Time, Place and Person. Appear in mild distress Eyes: PERRL ENT: Oral Mucosa clear moist. Neck: positive JVD Cardiovascular: S1 and S2 Present, no Murmur, Peripheral Pulses Present Respiratory: Bilateral Air entry equal and Decreased on the right, right basal Crackles, no wheezes Abdomen: Bowel Sound present, Soft and no tenderness Skin: Diffuse erythema on the lower extremity Extremities: Bilateral Pedal edema, no calf tenderness Neurologic: Grossly no focal neuro deficit.  Labs on Admission:  CBC:  Recent Labs Lab 12/24/15 1700  WBC 7.5  NEUTROABS 5.7  HGB 15.9*  HCT 47.2*  MCV 87.7  PLT 156    CMP     Component Value Date/Time   NA 134* 12/24/2015 1700   K 4.3 12/24/2015 1700   CL 97* 12/24/2015 1700   CO2 27 12/24/2015 1700   GLUCOSE 119* 12/24/2015 1700   BUN 17 12/24/2015 1700   CREATININE 0.78 12/24/2015 1700   CALCIUM 9.6 12/24/2015 1700   PROT 7.5 12/24/2015 1700   ALBUMIN 4.2 12/24/2015 1700   AST 33 12/24/2015 1700   ALT 26 12/24/2015 1700   ALKPHOS 143* 12/24/2015 1700   BILITOT 1.1 12/24/2015 1700   GFRNONAA >60 12/24/2015 1700   GFRAA >60 12/24/2015 1700     Recent Labs Lab 12/24/15 1700  TROPONINI 0.03   BNP (last 3 results)  Recent Labs  12/07/15 0752 12/24/15 1700  BNP 760.0* 563.0*    ProBNP (last 3 results) No results for input(s): PROBNP in the last 8760 hours.   Radiological Exams on Admission: Dg Chest 2 View  12/24/2015  CLINICAL DATA:  Leg swelling. History of CHF. Prior right thoracentesis. EXAM: CHEST  2 VIEW COMPARISON:  12/09/2015 FINDINGS: Increased densities throughout the right chest is compatible with a large pleural effusion. Again noted is  enlargement of the cardiac silhouette. Subtle interstitial densities throughout both lungs may represent mild pulmonary edema. Again noted is a nodular density in the left lower chest most likely related to a nipple shadow. Degenerative changes near the thoracolumbar spine and cannot exclude a compression fracture in this area. IMPRESSION: Re-accumulation of right pleural fluid. There is a large right pleural effusion. Cardiomegaly and cannot exclude mild interstitial pulmonary edema. Electronically Signed   By: Markus Daft M.D.   On: 12/24/2015 17:43   US Venous Img Lower Unilateral Right  12/24/2015  CLINICAL DATA:  Right lower extremity pain and swelling for the past 2 weeks. History of pulmonary embolism. Evaluate for DVT EXAM: RIGHT LOWER EXTREMITY VENOUS DOPPLER ULTRASOUND TECHNIQUE: Gray-scale  sonography with graded compression, as well as color Doppler and duplex ultrasound were performed to evaluate the lower extremity deep venous systems from the level of the common femoral vein and including the common femoral, femoral, profunda femoral, popliteal and calf veins including the posterior tibial, peroneal and gastrocnemius veins when visible. The superficial great saphenous vein was also interrogated. Spectral Doppler was utilized to evaluate flow at rest and with distal augmentation maneuvers in the common femoral, femoral and popliteal veins. COMPARISON:  None. FINDINGS: Contralateral Common Femoral Vein: Respiratory phasicity is normal and symmetric with the symptomatic side. No evidence of thrombus. Normal compressibility. Common Femoral Vein: No evidence of thrombus. Normal compressibility, respiratory phasicity and response to augmentation. Saphenofemoral Junction: No evidence of thrombus. Normal compressibility and flow on color Doppler imaging. Profunda Femoral Vein: No evidence of thrombus. Normal compressibility and flow on color Doppler imaging. Femoral Vein: No evidence of thrombus. Normal  compressibility, respiratory phasicity and response to augmentation. Popliteal Vein: No evidence of thrombus. Normal compressibility, respiratory phasicity and response to augmentation. Calf Veins: No evidence of thrombus. Normal compressibility and flow on color Doppler imaging. Superficial Great Saphenous Vein: No evidence of thrombus. Normal compressibility and flow on color Doppler imaging. Venous Reflux:  None. Other Findings:  None. IMPRESSION: No evidence of right lower extremity DVT. Electronically Signed   By: Sandi Mariscal M.D.   On: 12/24/2015 17:49   Assessment/Plan 1. Recurrent right pleural effusion He presents with complaints of leg swelling as well as increased shortness of breath. She is not hypoxic but appears to be significantly tachypneic. Chest x-ray shows reoccurrence of the right pleural effusion. BNP is elevated. With this the patient will be admitted in the hospital. She has received 40 mg of Lasix in the morning I will give her IV 20 mg 1. She would also receive ultrasound-guided thoracentesis in the morning with lab work. We will monitor her on telemetry with daily ins and outs. Physical therapy consultation.  2. Leg swelling. Leg redness. Check ESR and CRP. No evidence of cellulitis at present to suggest antibiotic use. Monitor clinical response to diuresis.  3. Down syndrome. PTOT consultation.  4. Atrial fibrillation with chronic anticoagulation.  continue warfarin. Also continue digoxin and Cardizem.  5. Essential hypertension. Next and continuing lisinopril. Currently continuing Lopressor but may need to consider on discharge or as an outpatient use of dual AV nodal blocking agent both Cardizem as well as Lopressor.  6. Type 2 diabetes mellitus. Patient is on metformin and glipizide at home. Currently holding both of them. Placing the patient on sliding scale insulin.  Nutrition: Cardiac diet, nothing by mouth after midnight DVT Prophylaxis: on  therapeutic anticoagulation.  Advance goals of care discussion: Full code   Consults: none  Family Communication: family was present at bedside, opportunity was given to ask question and all questions were answered satisfactorily at the time of interview. Disposition: Admitted as observation, telemetry unit.  Author: Berle Mull, MD [facility] Pager: [phone] 12/24/2015  If 7PM-7AM, please contact night-coverage [email or web address] Password [email or web address]

## 2015-12-25 ENCOUNTER — Encounter (HOSPITAL_COMMUNITY): Payer: Self-pay | Admitting: Internal Medicine

## 2015-12-25 ENCOUNTER — Ambulatory Visit (HOSPITAL_COMMUNITY): Payer: Medicare Other

## 2015-12-25 ENCOUNTER — Observation Stay (HOSPITAL_COMMUNITY): Payer: Medicare Other

## 2015-12-25 DIAGNOSIS — J9811 Atelectasis: Secondary | ICD-10-CM | POA: Diagnosis not present

## 2015-12-25 DIAGNOSIS — I509 Heart failure, unspecified: Secondary | ICD-10-CM | POA: Diagnosis not present

## 2015-12-25 DIAGNOSIS — L03115 Cellulitis of right lower limb: Secondary | ICD-10-CM | POA: Diagnosis present

## 2015-12-25 DIAGNOSIS — I482 Chronic atrial fibrillation: Secondary | ICD-10-CM | POA: Diagnosis not present

## 2015-12-25 DIAGNOSIS — I5023 Acute on chronic systolic (congestive) heart failure: Secondary | ICD-10-CM | POA: Diagnosis not present

## 2015-12-25 DIAGNOSIS — I272 Other secondary pulmonary hypertension: Secondary | ICD-10-CM

## 2015-12-25 DIAGNOSIS — J90 Pleural effusion, not elsewhere classified: Secondary | ICD-10-CM

## 2015-12-25 DIAGNOSIS — J948 Other specified pleural conditions: Secondary | ICD-10-CM | POA: Diagnosis not present

## 2015-12-25 LAB — BODY FLUID CELL COUNT WITH DIFFERENTIAL
Eos, Fluid: 0 %
LYMPHS FL: 68 %
MONOCYTE-MACROPHAGE-SEROUS FLUID: 27 % — AB (ref 50–90)
NEUTROPHIL FLUID: 5 % (ref 0–25)
Total Nucleated Cell Count, Fluid: 213 cu mm (ref 0–1000)

## 2015-12-25 LAB — BASIC METABOLIC PANEL
Anion gap: 8 (ref 5–15)
BUN: 16 mg/dL (ref 6–20)
CHLORIDE: 102 mmol/L (ref 101–111)
CO2: 24 mmol/L (ref 22–32)
CREATININE: 0.48 mg/dL (ref 0.44–1.00)
Calcium: 8.6 mg/dL — ABNORMAL LOW (ref 8.9–10.3)
GFR calc Af Amer: >60 mL/min (ref >60)
GFR calc non Af Amer: >60 mL/min (ref >60)
GLUCOSE: 117 mg/dL — AB (ref 65–99)
POTASSIUM: 3.9 mmol/L (ref 3.5–5.1)
Sodium: 134 mmol/L — ABNORMAL LOW (ref 135–145)

## 2015-12-25 LAB — GRAM STAIN

## 2015-12-25 LAB — PROTIME-INR
INR: 2.45 — ABNORMAL HIGH (ref 0.00–1.49)
PROTHROMBIN TIME: 26.3 s — AB (ref 11.6–15.2)

## 2015-12-25 LAB — CBC WITH DIFFERENTIAL/PLATELET
BASOS ABS: 0 10*3/uL (ref 0.0–0.1)
BASOS PCT: 0 %
EOS ABS: 0.1 10*3/uL (ref 0.0–0.7)
EOS PCT: 2 %
HEMATOCRIT: 43.3 % (ref 36.0–46.0)
Hemoglobin: 14 g/dL (ref 12.0–15.0)
Lymphocytes Relative: 15 %
Lymphs Abs: 1 10*3/uL (ref 0.7–4.0)
MCH: 28.5 pg (ref 26.0–34.0)
MCHC: 32.3 g/dL (ref 30.0–36.0)
MCV: 88.2 fL (ref 78.0–100.0)
MONO ABS: 0.5 10*3/uL (ref 0.1–1.0)
Monocytes Relative: 7 %
NEUTROS ABS: 5.1 10*3/uL (ref 1.7–7.7)
Neutrophils Relative %: 76 %
PLATELETS: 180 10*3/uL (ref 150–400)
RBC: 4.91 MIL/uL (ref 3.87–5.11)
RDW: 14.8 % (ref 11.5–15.5)
WBC: 6.8 10*3/uL (ref 4.0–10.5)

## 2015-12-25 LAB — GLUCOSE, CAPILLARY
GLUCOSE-CAPILLARY: 109 mg/dL — AB (ref 65–99)
GLUCOSE-CAPILLARY: 104 mg/dL — AB (ref 65–99)
GLUCOSE-CAPILLARY: 134 mg/dL — AB (ref 65–99)
Glucose-Capillary: 145 mg/dL — ABNORMAL HIGH (ref 65–99)

## 2015-12-25 LAB — LACTATE DEHYDROGENASE, PLEURAL OR PERITONEAL FLUID: LD, Fluid: 111 U/L — ABNORMAL HIGH (ref 3–23)

## 2015-12-25 LAB — GLUCOSE, SEROUS FLUID: Glucose, Fluid: 130 mg/dL

## 2015-12-25 LAB — MAGNESIUM: Magnesium: 1.9 mg/dL (ref 1.7–2.4)

## 2015-12-25 LAB — PROTEIN, BODY FLUID: Total protein, fluid: 4.2 g/dL

## 2015-12-25 LAB — SEDIMENTATION RATE: Sed Rate: 5 mm/hr (ref 0–22)

## 2015-12-25 LAB — C-REACTIVE PROTEIN

## 2015-12-25 LAB — LACTATE DEHYDROGENASE: LDH: 212 U/L — AB (ref 98–192)

## 2015-12-25 MED ORDER — VANCOMYCIN HCL 10 G IV SOLR
1500.0000 mg | Freq: Once | INTRAVENOUS | Status: AC
  Administered 2015-12-25: 1500 mg via INTRAVENOUS
  Filled 2015-12-25: qty 1500

## 2015-12-25 MED ORDER — FUROSEMIDE 10 MG/ML IJ SOLN
60.0000 mg | Freq: Every day | INTRAMUSCULAR | Status: DC
  Administered 2015-12-26 – 2015-12-27 (×2): 60 mg via INTRAVENOUS
  Filled 2015-12-25 (×3): qty 6

## 2015-12-25 MED ORDER — WARFARIN SODIUM 5 MG PO TABS
6.0000 mg | ORAL_TABLET | Freq: Once | ORAL | Status: AC
  Administered 2015-12-25: 6 mg via ORAL
  Filled 2015-12-25: qty 1

## 2015-12-25 MED ORDER — SODIUM CHLORIDE 0.9 % IV SOLN
1250.0000 mg | INTRAVENOUS | Status: DC
  Administered 2015-12-26: 1250 mg via INTRAVENOUS
  Filled 2015-12-25 (×5): qty 1250

## 2015-12-25 MED ORDER — DILTIAZEM HCL 60 MG PO TABS
60.0000 mg | ORAL_TABLET | Freq: Three times a day (TID) | ORAL | Status: DC
  Administered 2015-12-25 – 2015-12-27 (×6): 60 mg via ORAL
  Filled 2015-12-25 (×5): qty 1

## 2015-12-25 MED ORDER — POTASSIUM CHLORIDE CRYS ER 20 MEQ PO TBCR
20.0000 meq | EXTENDED_RELEASE_TABLET | Freq: Two times a day (BID) | ORAL | Status: AC
  Administered 2015-12-25 (×2): 20 meq via ORAL
  Filled 2015-12-25 (×2): qty 1

## 2015-12-25 NOTE — Progress Notes (Signed)
ANTICOAGULATION CONSULT NOTE - Initial Consult  Pharmacy Consult for Warfarin Indication: atrial fibrillation  No Known Allergies  Patient Measurements: Height:  (165.1 cm) Weight: 150 lb 2.1 oz (68.1 kg) IBW/kg (Calculated) : 57   Vital Signs: Temp: 98 F (36.7 C) (02/01 0448) Temp Source: Oral (01/31 2113) BP: 120/61 mmHg (02/01 0448) Pulse Rate: 58 (02/01 0451)  Labs:  Recent Labs  12/24/15 1700 12/25/15 0544  HGB 15.9* 14.0  HCT 47.2* 43.3  PLT 156 180  LABPROT 29.4* 26.3*  INR 2.84* 2.45*  CREATININE 0.78 0.48  TROPONINI 0.03  --     Estimated Creatinine Clearance: 58.9 mL/min (by C-G formula based on Cr of 0.48).   Medical History: Past Medical History  Diagnosis Date  . Down syndrome   . Hypertension   . CHF (congestive heart failure) (HCC)   . Atrial fibrillation (HCC)   . Pneumonia   . Pulmonary embolism (HCC)   . Leg fracture   . Systolic CHF (HCC)   . Shortness of breath     Medications:  Prescriptions prior to admission  Medication Sig Dispense Refill Last Dose  . alendronate (FOSAMAX) 70 MG tablet Take 70 mg by mouth once a week. Takes on Mondays. Take with a full glass of water on an empty stomach.   12/23/2015 at Unknown time  . betamethasone dipropionate (DIPROLENE) 0.05 % cream Apply 1 application topically 2 (two) times daily as needed (for itching).   unknown  . Calcium Carbonate (CALCIUM 600 PO) Take 600 mg by mouth daily.   12/24/2015 at 800a  . digoxin (LANOXIN) 0.25 MG tablet Take 250 mcg by mouth daily.   12/24/2015 at 800a  . diltiazem (CARDIZEM) 120 MG tablet Take 120 mg by mouth 3 (three) times daily.   12/24/2015 at 1400  . furosemide (LASIX) 40 MG tablet Take 40 mg by mouth daily.    12/24/2015 at 800a  . glipiZIDE (GLUCOTROL) 5 MG tablet Take 5 mg by mouth 2 (two) times daily before a meal.   12/23/2015 at 1700  . lisinopril (PRINIVIL,ZESTRIL) 5 MG tablet Take 5 mg by mouth daily.   12/24/2015 at 800a  . metFORMIN  (GLUCOPHAGE-XR) 500 MG 24 hr tablet Take 500 mg by mouth daily.    12/24/2015 at 800a  . metoprolol tartrate (LOPRESSOR) 25 MG tablet Take 25 mg by mouth 2 (two) times daily.   12/24/2015 at 800a  . triamcinolone cream (KENALOG) 0.1 % Apply 1 application topically 2 (two) times daily.   12/23/2015 at 2000  . Vitamin D, Cholecalciferol, 400 UNITS TABS Take 400 Units by mouth daily.   12/24/2015 at 800  . warfarin (COUMADIN) 2 MG tablet Take 1-2 tablets by mouth See admin instructions. Take 1 tablet by mouth on Tuesday, Thursday and Saturday.  Take (1/2) tablet by mouth on Sunday, Monday, Wednesday, and Friday.   12/15/2015 at Unknown time  . warfarin (COUMADIN) 6 MG tablet Take 6 mg by mouth every evening.   12/23/2015 at 1700    Assessment: Okay for Protocol, INR at goal.  No bleeding noted.  right-sided pleural effusion noted.   Goal of Therapy:  INR 2-3   Plan:  Warfarin  PO x 1 today Daily PT/INR Monitor for signs and symptoms of bleeding.   Elder Cyphers, BS Loura Back, BCPS Clinical Pharmacist Pager [phone] 12/25/2015,8:13 AM

## 2015-12-25 NOTE — Progress Notes (Signed)
[facility] PROGRESS NOTE  Angel Torres ZOX:[id number] DOB: 06/06/1946 DOA: 12/24/2015 PCP: Kirk Ruths, MD    Code Status: Full code Family Communication: Discussed with her brother Disposition Plan: Discharge when clinically appropriate   Consultants:  None  Procedures:  Thoracentesis, pending  2-D echocardiogram Study Conclusions:  FROM 12/08/15  - Left ventricle: The cavity size was normal. There was mild concentric hypertrophy. The estimated ejection fraction was 48%. Diffuse hypokinesis. The study is not technically sufficient to allow evaluation of LV diastolic function. Ejection fraction (MOD, 2-plane): 48%. - Ventricular septum: Septal motion showed paradox. The contour showed diastolic flattening. - Mitral valve: Mildly thickened leaflets . There was mild regurgitation. - Left atrium: The atrium was at the upper limits of normal in size. - Right ventricle: The cavity size was moderately dilated. Systolic function is reduced. - Right atrium: The atrium was mildly dilated. - Tricuspid valve: There was moderate regurgitation. - Pulmonary arteries: PA peak pressure: 52 mm Hg (S). - Inferior vena cava: The vessel was dilated. The respirophasic diameter changes were blunted (< 50%), consistent with elevated central venous pressure. Impressions: - Compared to a prior echo in 2010, LVEF is higher at 48%. There is more significant right heart enlargement, RV hypokinesis, moderate pulmoanry hypertension with signs of volume and pressure overload, RVSP of 52 mmHg, moderate TR and dilated IVC.  Antibiotics:  Vancomycin, pending  HPI/Subjective: Patient has no complaints of chest pain, shortness of breath, or leg pain at rest. Her brother is in the room. Questions answered.  Objective: Filed Vitals:   12/25/15 0448 12/25/15 0451  BP: 120/61   Pulse: 42 58  Temp: 98 F (36.7 C)   Resp: 20    oxygen saturation 92% on room  air.  Intake/Output Summary (Last 24 hours) at 12/25/15 1149 Last data filed at 12/25/15 0808  Gross per 24 hour  Intake    240 ml  Output    300 ml  Net    -60 ml   Filed Weights   12/24/15 1550 12/24/15 2113 12/25/15 0448  Weight: 70.308 kg (155 lb) 68.2 kg (150 lb 5.7 oz) 68.1 kg (150 lb 2.1 oz)    Exam:   General:  70--year-old woman in no acute distress.  Cardiovascular: Irregular, irregular, with bradycardia.  Respiratory: Globally decreased breath sounds on the right, clear on the left; breathing nonlabored at rest.  Abdomen: Positive bowel sounds, soft, nontender, nondistended.  Musculoskeletal/extremities: 1+ bilateral lower extremity edema, nonpitting; diffuse erythema of her legs, right greater than left, starting at below the knee and extending to the dorsum; nontender.  Neurologic: Patient is alert and oriented to herself, hospital, and brother.  Data Reviewed: Basic Metabolic Panel:  Recent Labs Lab 12/24/15 1700 12/25/15 0544  NA 134* 134*  K 4.3 3.9  CL 97* 102  CO2 27 24  GLUCOSE 119* 117*  BUN 17 16  CREATININE 0.78 0.48  CALCIUM 9.6 8.6*  MG  --  1.9   Liver Function Tests:  Recent Labs Lab 12/24/15 1700  AST 33  ALT 26  ALKPHOS 143*  BILITOT 1.1  PROT 7.5  ALBUMIN 4.2   No results for input(s): LIPASE, AMYLASE in the last 168 hours. No results for input(s): AMMONIA in the last 168 hours. CBC:  Recent Labs Lab 12/24/15 1700 12/25/15 0544  WBC 7.5 6.8  NEUTROABS 5.7 5.1  HGB 15.9* 14.0  HCT 47.2* 43.3  MCV 87.7 88.2  PLT 156 180   Cardiac Enzymes:  Recent  Labs Lab 12/24/15 1700  TROPONINI 0.03   BNP (last 3 results)  Recent Labs  12/07/15 0752 12/24/15 1700  BNP 760.0* 563.0*    ProBNP (last 3 results) No results for input(s): PROBNP in the last 8760 hours.  CBG:  Recent Labs Lab 12/24/15 1553 12/24/15 2133 12/25/15 0758 12/25/15 1140  GLUCAP 141* 110* 109* 104*    No results found for this or any  previous visit (from the past 240 hour(s)).   Studies: Dg Chest 2 View  12/24/2015  CLINICAL DATA:  Leg swelling. History of CHF. Prior right thoracentesis. EXAM: CHEST  2 VIEW COMPARISON:  12/09/2015 FINDINGS: Increased densities throughout the right chest is compatible with a large pleural effusion. Again noted is enlargement of the cardiac silhouette. Subtle interstitial densities throughout both lungs may represent mild pulmonary edema. Again noted is a nodular density in the left lower chest most likely related to a nipple shadow. Degenerative changes near the thoracolumbar spine and cannot exclude a compression fracture in this area. IMPRESSION: Re-accumulation of right pleural fluid. There is a large right pleural effusion. Cardiomegaly and cannot exclude mild interstitial pulmonary edema. Electronically Signed   By: Richarda Overlie M.D.   On: 12/24/2015 17:43   US Venous Img Lower Unilateral Right  12/24/2015  CLINICAL DATA:  Right lower extremity pain and swelling for the past 2 weeks. History of pulmonary embolism. Evaluate for DVT EXAM: RIGHT LOWER EXTREMITY VENOUS DOPPLER ULTRASOUND TECHNIQUE: Gray-scale sonography with graded compression, as well as color Doppler and duplex ultrasound were performed to evaluate the lower extremity deep venous systems from the level of the common femoral vein and including the common femoral, femoral, profunda femoral, popliteal and calf veins including the posterior tibial, peroneal and gastrocnemius veins when visible. The superficial great saphenous vein was also interrogated. Spectral Doppler was utilized to evaluate flow at rest and with distal augmentation maneuvers in the common femoral, femoral and popliteal veins. COMPARISON:  None. FINDINGS: Contralateral Common Femoral Vein: Respiratory phasicity is normal and symmetric with the symptomatic side. No evidence of thrombus. Normal compressibility. Common Femoral Vein: No evidence of thrombus. Normal  compressibility, respiratory phasicity and response to augmentation. Saphenofemoral Junction: No evidence of thrombus. Normal compressibility and flow on color Doppler imaging. Profunda Femoral Vein: No evidence of thrombus. Normal compressibility and flow on color Doppler imaging. Femoral Vein: No evidence of thrombus. Normal compressibility, respiratory phasicity and response to augmentation. Popliteal Vein: No evidence of thrombus. Normal compressibility, respiratory phasicity and response to augmentation. Calf Veins: No evidence of thrombus. Normal compressibility and flow on color Doppler imaging. Superficial Great Saphenous Vein: No evidence of thrombus. Normal compressibility and flow on color Doppler imaging. Venous Reflux:  None. Other Findings:  None. IMPRESSION: No evidence of right lower extremity DVT. Electronically Signed   By: Simonne Come M.D.   On: 12/24/2015 17:49    Scheduled Meds: . digoxin  250 mcg Oral Daily  . diltiazem  120 mg Oral TID  . furosemide  60 mg Oral Daily  . insulin aspart  0-15 Units Subcutaneous TID WC  . insulin aspart  0-5 Units Subcutaneous QHS  . lisinopril  5 mg Oral Daily  . metoprolol tartrate  25 mg Oral BID  . sodium chloride flush  3 mL Intravenous Q12H  . Warfarin - Pharmacist Dosing Inpatient   Does not apply Q24H   Continuous Infusions:   Assessment and plan:  Principal Problem:   Recurrent right pleural effusion Active Problems:   Chronic  atrial fibrillation (HCC)   Down's syndrome   Essential hypertension   Acute on chronic diastolic heart failure (HCC)   Type 2 diabetes mellitus (HCC)    1. Recurrent large right pleural effusion. The patient was admitted with shortness of breath. Her chest x-ray on admission revealed a large right pleural effusion. She was recently hospitalized from 12/07/2015 through 12/10/2015 for the same. The etiology was thought to be secondary to decompensated systolic heart failure. She underwent a thoracentesis  on 1/16 with 1.5 L removed. No studies were done at that time. -Etiology possibly secondary to decompensated heart failure. However, thoracentesis has been ordered with studies on the fluid. -We'll evaluate the fluid studies. If it looks like it is transudative, will increase Lasix to diurese her more. We'll also consider CT scan imaging if it is not truly transudative. -We'll assess her thyroid function with a TSH/free T4  Biventricular heart failure and pulmonary hypertension. Patient was restarted on Lasix, Lanoxin, lisinopril, diltiazem and metoprolol. She underwent an echocardiogram evaluation on 12/08/15. It revealed an EF of 48%, LV and RV hypokinesis, and moderate pulmonary hypertension. -Patient was given a total of 60 mg of IV Lasix following admission, followed by an increased dose of oral Lasix. -We will change Lasix back to IV; monitor ins and outs and daily weights.  Chronic atrial fibrillation. Patient was restarted on metoprolol and diltiazem for rate control and warfarin for anticoagulation. -Her heart rate has been somewhat bradycardic, so we'll place parameters to hold metoprolol and diltiazem for heart rate of 55 or less. -We'll decrease diltiazem from 120 mg 3 times a day to 60 mg 3 times a day. -Coumadin dosing per pharmacy.  Type 2 diabetes mellitus. Patient is treated with metformin and glipizide chronically. They are on hold. We'll continue sliding scale NovoLog. Her CBGs are reasonable.  R>L lower extremity cellulitis. Will start vancomycin.   Time spent: 35 minutes     Adeleine Pask  [facility] Pager: [phone] . If 7PM-7AM, please contact night-coverage at [email or web address], password [facility] 12/25/2015, 11:49 AM

## 2015-12-25 NOTE — Clinical Social Work Note (Signed)
Clinical Social Work Assessment  Patient Details  Name: Angel Torres MRN: [id number] Date of Birth: [birth year]  Date of referral:  12/25/15               Reason for consult:                   Permission sought to share information with:  Family Supports Permission granted to share information::     Name::        Agency::     Relationship::     Contact Information:     Housing/Transportation Living arrangements for the past 2 months:    Source of Information:    Patient Interpreter Needed:  None Criminal Activity/Legal Involvement Pertinent to Current Situation/Hospitalization:  No - Comment as needed Significant Relationships:  Siblings Lives with:    Do you feel safe going back to the place where you live?    Need for family participation in patient care:     Care giving concerns:  Patient admitted from [facility]-    Social Worker assessment / plan:  Patient will return to [facility] at dc or be referred to SNF if this is needed - PT eval is pending.   Employment status:    Insurance information:  Medicare PT Recommendations:    Information / Referral to community resources:  Skilled Nursing Facility ve Patient/Family's Response to care:  Positive  Patient/Family's Understanding of and Emotional Response to Diagnosis, Current Treatment, and Prognosis:  Family understands that she may need to go to SNF for rehab- depending on PT eval and recommendations-  Emotional Assessment Appearance:  Appears older than stated age Attitude/Demeanor/Rapport:    Affect (typically observed):    Orientation:  Oriented to Self, Oriented to Place Alcohol / Substance use:    Psych involvement (Current and /or in the community):     Discharge Needs  Concerns to be addressed:    Readmission within the last 30 days:    Current discharge risk:    Barriers to Discharge:      Liliana Cline, LCSW 12/25/2015, 1:59 PM

## 2015-12-25 NOTE — Plan of Care (Signed)
Problem: Education: Goal: Knowledge of [facility] Education information/materials will improve Outcome: Progressing Reviewed plan of care with family (sister and brother).  Problem: Pain Managment: Goal: General experience of comfort will improve Outcome: Progressing Pt denied pain during assessment.   Problem: Skin Integrity: Goal: Risk for impaired skin integrity will decrease Outcome: Not Progressing Pt's skin is red on the feet, heels, legs, and the vaginal area. Noted upon assessment.  Problem: Tissue Perfusion: Goal: Risk factors for ineffective tissue perfusion will decrease Outcome: Progressing Pt taking Coumadin.  Problem: Nutrition: Goal: Adequate nutrition will be maintained Outcome: Progressing Pt now NPO after midnight for procedure.

## 2015-12-25 NOTE — NC FL2 (Addendum)
MEDICAID FL2 LEVEL OF CARE SCREENING TOOL     IDENTIFICATION  Patient Name: Angel Torres Birthdate: [birth year] Sex: female Admission Date (Current Location): 12/24/2015  [place] and [place] Number:  Aaron Edelman [id number] Facility and Address:  [facility],  [address] [place] [address]      Provider Number: [id number]  Attending Physician Name and Address:  Elliot Cousin, MD  Relative Name and Phone Number:       Current Level of Care: Hospital Recommended Level of Care: Assisted Living Facility Prior Approval Number:    Date Approved/Denied:   PASRR Number:    Discharge Plan: Other (Comment)    Current Diagnoses: Patient Active Problem List   Diagnosis Date Noted  . Recurrent right pleural effusion 12/24/2015  . Acute on chronic diastolic heart failure (HCC) 12/24/2015  . Type 2 diabetes mellitus (HCC) 12/24/2015  . Pleural effusion, right 12/07/2015  . Acute diastolic heart failure (HCC) 12/07/2015  . Atrial fibrillation (HCC) 12/07/2015  . Down's syndrome 12/07/2015  . Essential hypertension 12/07/2015    Orientation RESPIRATION BLADDER Height & Weight     Self, Place  Normal Incontinent Weight: 150 lb 2.1 oz (68.1 kg) Height:   (165.1 cm)  BEHAVIORAL SYMPTOMS/MOOD NEUROLOGICAL BOWEL NUTRITION STATUS  Other (Comment)   Continent    AMBULATORY STATUS COMMUNICATION OF NEEDS Skin   Limited Assist Verbally Normal                       Personal Care Assistance Level of Assistance  Bathing, Feeding, Dressing Bathing Assistance: Limited assistance Feeding assistance: Limited assistance Dressing Assistance: Limited assistance     Functional Limitations Info  Sight, Hearing, Speech     Speech Info: Adequate    SPECIAL CARE FACTORS FREQUENCY                       Contractures      Additional Factors Info  Code Status, Allergies               Current Medications (12/25/2015):  This is the current  hospital active medication list Current Facility-Administered Medications  Medication Dose Route Frequency Provider Last Rate Last Dose  . 0.9 %  sodium chloride infusion  250 mL Intravenous PRN Rolly Salter, MD      . acetaminophen (TYLENOL) tablet 650 mg  650 mg Oral Q4H PRN Rolly Salter, MD      . digoxin [facility]) tablet 250 mcg  250 mcg Oral Daily Rolly Salter, MD      . diltiazem (CARDIZEM) tablet 120 mg  120 mg Oral TID Rolly Salter, MD   120 mg at 12/24/15 2231  . furosemide (LASIX) tablet 60 mg  60 mg Oral Daily Rolly Salter, MD      . insulin aspart (novoLOG) injection 0-15 Units  0-15 Units Subcutaneous TID WC Rolly Salter, MD      . insulin aspart (novoLOG) injection 0-5 Units  0-5 Units Subcutaneous QHS Rolly Salter, MD   0 Units at 12/24/15 2200  . lisinopril (PRINIVIL,ZESTRIL) tablet 5 mg  5 mg Oral Daily Rolly Salter, MD      . metoprolol tartrate (LOPRESSOR) tablet 25 mg  25 mg Oral BID Rolly Salter, MD   25 mg at 12/24/15 2231  . ondansetron (ZOFRAN) injection 4 mg  4 mg Intravenous Q6H PRN Rolly Salter, MD      .  sodium chloride flush (NS) 0.9 % injection 3 mL  3 mL Intravenous Q12H Rolly Salter, MD   3 mL at 12/24/15 2232  . sodium chloride flush (NS) 0.9 % injection 3 mL  3 mL Intravenous PRN Rolly Salter, MD      . Warfarin - Pharmacist Dosing Inpatient   Does not apply Q24H Rolly Salter, MD         Discharge Medications:     START taking these medications   Details  cephALEXin (KEFLEX) 500 MG capsule Take 1 capsule (500 mg total) by mouth 2 (two) times daily. ANTIBIOTIC TO BE TAKEN FOR 3 MORE DAYS, STARTING ON 12/28/15. Qty: 6 capsule, Refills: 0      CONTINUE these medications which have CHANGED   Details  diltiazem (CARDIZEM) 60 MG tablet Take 1 tablet (60 mg total) by mouth 3 (three) times daily. Qty: 90 tablet, Refills: 3    furosemide (LASIX) 40 MG tablet Take 2 tablets (80 mg total) by mouth daily. Qty: 60 tablet,  Refills: 3    glipiZIDE (GLUCOTROL) 5 MG tablet Take 1 tablet (5 mg total) by mouth 2 (two) times daily before a meal. HOLD IF BLOOD SUGAR IS LESS THAN 115.    metFORMIN (GLUCOPHAGE-XR) 500 MG 24 hr tablet Take 1 tablet (500 mg total) by mouth daily. DO NOT START UNTIL 12/28/2015.      CONTINUE these medications which have NOT CHANGED   Details  alendronate (FOSAMAX) 70 MG tablet Take 70 mg by mouth once a week. Takes on Mondays. Take with a full glass of water on an empty stomach.    betamethasone dipropionate (DIPROLENE) 0.05 % cream Apply 1 application topically 2 (two) times daily as needed (for itching).    Calcium Carbonate (CALCIUM 600 PO) Take 600 mg by mouth daily.    digoxin (LANOXIN) 0.25 MG tablet Take 250 mcg by mouth daily.    lisinopril (PRINIVIL,ZESTRIL) 5 MG tablet Take 5 mg by mouth daily.    metoprolol tartrate (LOPRESSOR) 25 MG tablet Take 25 mg by mouth 2 (two) times daily.    triamcinolone cream (KENALOG) 0.1 % Apply 1 application topically 2 (two) times daily.    Vitamin D, Cholecalciferol, 400 UNITS TABS Take 400 Units by mouth daily.    !! warfarin (COUMADIN) 2 MG tablet Take 1-2 tablets by mouth See admin instructions. Take 1 tablet by mouth on Tuesday, Thursday and Saturday. Take (1/2) tablet by mouth on Sunday, Monday, Wednesday, and Friday.    !! warfarin (COUMADIN) 6 MG tablet Take 6 mg by mouth every evening.    !! - Potential duplicate medications found. Please discuss with provider.        Please see discharge summary for a list of discharge medications.  Relevant Imaging Results:  Relevant Lab Results:   Additional Information SS#: [id number]  Liliana Cline, LCSW

## 2015-12-25 NOTE — Progress Notes (Signed)
ANTIBIOTIC CONSULT NOTE - INITIAL  Pharmacy Consult for Vancomycin Indication: cellulitis  No Known Allergies  Patient Measurements: Height: 5\' 5"  (165.1 cm) Weight: 150 lb 2.1 oz (68.1 kg) IBW/kg (Calculated) : 57  Vital Signs: Temp: 98 F (36.7 C) (02/01 0448) BP: 120/61 mmHg (02/01 0448) Pulse Rate: 58 (02/01 0451) Intake/Output from previous day: 01/31 0701 - 02/01 0700 In: 240 [P.O.:240] Out: 200 [Urine:200] Intake/Output from this shift: Total I/O In: -  Out: 100 [Urine:100]  Labs:  Recent Labs  12/24/15 1700 12/25/15 0544  WBC 7.5 6.8  HGB 15.9* 14.0  PLT 156 180  CREATININE 0.78 0.48   Estimated Creatinine Clearance: 58.9 mL/min (by C-G formula based on Cr of 0.48). No results for input(s): VANCOTROUGH, VANCOPEAK, VANCORANDOM, GENTTROUGH, GENTPEAK, GENTRANDOM, TOBRATROUGH, TOBRAPEAK, TOBRARND, AMIKACINPEAK, AMIKACINTROU, AMIKACIN in the last 72 hours.   Microbiology: No results found for this or any previous visit (from the past 720 hour(s)).  Medical History: Past Medical History  Diagnosis Date  . Down syndrome   . Hypertension   . CHF (congestive heart failure) (HCC)   . Atrial fibrillation (HCC)   . Pneumonia   . Pulmonary embolism (HCC)   . Leg fracture   . Systolic CHF (HCC)   . Shortness of breath   . Pulmonary hypertension (HCC) 12/25/2015  . Biventricular heart failure (HCC) 12/24/2015    Medications:  See med rec Assessment: 70 yo female admitted with shortness of breath due to large right recurrent pleural effusion. (recently admitted for same dx). Pt with chronic afib, heart failure, and pumonary HTN.  In addition, pt has a lower extremity cellulitis. To start Vancomycin empiric therapy.  Goal of Therapy:  Vancomycin trough level 10-15 mcg/ml  Plan:  Vancomycin  loading dose then  IV q24h Measure antibiotic drug levels at steady state Follow up culture results  Monitor V/S and labs  Elder Cyphers, BS Loura Back,  BCPS Clinical Pharmacist Pager [phone] 12/25/2015,1:01 PM

## 2015-12-25 NOTE — Care Management Note (Signed)
Case Management Note  Patient Details  Name: Angel Torres MRN: [id number] Date of Birth: 1946-08-28  Subjective/Objective:                  Pt admitted for recurrent pleural effusion. Pt is from [facility]. Pt to undergo thoracentesis today. Anticipate pt will return to ALF at DC. CSW is aware of DC plan and will arrange for return to facility when/if appropriate. May need HH PT, awaiting PT recommendations.   Action/Plan: Will cont to follow.   Expected Discharge Date:     12/26/2015             Expected Discharge Plan:  Assisted Living / Rest Home  In-House Referral:  Clinical Social Work  Discharge planning Services  CM Consult  Post Acute Care Choice:  NA Choice offered to:  NA  DME Arranged:    DME Agency:     HH Arranged:    HH Agency:     Status of Service:  In process, will continue to follow  Medicare Important Message Given:    Date Medicare IM Given:    Medicare IM give by:    Date Additional Medicare IM Given:    Additional Medicare Important Message give by:     If discussed at Long Length of Stay Meetings, dates discussed:    Additional Comments:  Malcolm Metro, RN 12/25/2015, 3:05 PM

## 2015-12-25 NOTE — Progress Notes (Signed)
Patient admitted from [facility] ALF- CSW has spoken with Lupita Leash at ALF who is agreeable to patient returning once stable- awaiting PT eval/recommendations at this time but ALF says she is ambulatory with a walker. FL2 updated and will await callback from family/HCPOA for full assessment to be completed.   Reece Levy, MSW, Theresia Majors  [phone]

## 2015-12-25 NOTE — Evaluation (Signed)
Occupational Therapy Evaluation Patient Details Name: Angel Torres MRN: [id number] DOB: 09/27/1946 Today's Date: 12/25/2015    History of Present Illness Angel Torres is a 70 y.o. female with Past medical history of Down syndrome, hypertension, diastolic dysfunction, atrial fibrillation, right-sided pleural effusion. Patient presented with complaints of off shortness of breath. She also had complaints of leg swelling. Patient is from an assisted living facility and today requested her nurse to evaluate her for leg swelling and was brought here for further workup.   Clinical Impression   Pt awake, alert, agreeable to OT evaluation, brother present for eval. Pt is HOH. Pt demonstrates mod I-supervision during ADL tasks, requires supervision for toilet transfer, min guard during functional mobility tasks this am. Pt brother reports pt is at baseline with mobility and ADL completion, main concerns are medical issues with heart and edema. Pt appears to be at baseline with ADL completion, requiring some assistance/supervision during tasks. No further acute OT services required, pt should be able to discharge back to ALF when medically ready.     Follow Up Recommendations  Other (comment) (return to ALF)    Equipment Recommendations  None recommended by OT       Precautions / Restrictions Precautions Precautions: Fall Restrictions Weight Bearing Restrictions: No      Mobility Bed Mobility Overal bed mobility: Modified Independent                Transfers Overall transfer level: Modified independent Equipment used: Rolling walker (2 wheeled)                       ADL Overall ADL's : Needs assistance/impaired     Grooming: Wash/dry hands;Standing;Modified independent               Lower Body Dressing: Modified independent;Sitting/lateral leans   Toilet Transfer: Supervision/safety;Regular Toilet;RW   Toileting- Clothing Manipulation and Hygiene:  Supervision/safety;Sit to/from stand       Functional mobility during ADLs: Min guard;Rolling walker       Vision Vision Assessment?: No apparent visual deficits          Pertinent Vitals/Pain Pain Assessment: No/denies pain     Hand Dominance Right   Extremity/Trunk Assessment Upper Extremity Assessment Upper Extremity Assessment: Overall WFL for tasks assessed   Lower Extremity Assessment Lower Extremity Assessment: Defer to PT evaluation       Communication Communication Communication: HOH   Cognition Arousal/Alertness: Awake/alert Behavior During Therapy: WFL for tasks assessed/performed Overall Cognitive Status: History of cognitive impairments - at baseline (down syndrome)                                Home Living Family/patient expects to be discharged to:: Assisted living                             Home Equipment: Walker - 2 wheels;Shower seat;Bedside commode          Prior Functioning/Environment Level of Independence: Needs assistance  Gait / Transfers Assistance Needed: Pt uses walker for functional mobility; Pt brother reports she is "paralyzed" without her walker ADL's / Homemaking Assistance Needed: Pt requires some assistance for dressing and bathing tasks.          End of Session Equipment Utilized During Treatment: Gait belt;Rolling walker  Activity Tolerance: Patient tolerated treatment well Patient left: in bed;with  call bell/phone within reach;with family/visitor present   Time: 1610-9604 OT Time Calculation (min): 19 min Charges:  OT General Charges $OT Visit: 1 Procedure OT Evaluation $OT Eval Low Complexity: 1 Procedure G-Codes: OT G-codes **NOT FOR INPATIENT CLASS** Functional Assessment Tool Used: clinical judgement Functional Limitation: Self care Self Care Current Status (V4098): At least 40 percent but less than 60 percent impaired, limited or restricted Self Care Goal Status (J1914): At least  40 percent but less than 60 percent impaired, limited or restricted Self Care Discharge Status [phone]): At least 40 percent but less than 60 percent impaired, limited or restricted  Ezra Sites, OTR/L  [phone]  12/25/2015, 12:15 PM

## 2015-12-25 NOTE — Procedures (Signed)
PreOperative Dx: Recurrent RT pleural effusion Postoperative Dx: Recurrent RT pleural effusion Procedure:   US guided RT thoracentesis Radiologist:  Tyron Russell Anesthesia:  9 ml of 1% lidocaine Specimen:  1500 ml of amber colored colored fluid EBL:   < 1 ml Complications: NOne

## 2015-12-25 NOTE — Care Management Obs Status (Signed)
MEDICARE OBSERVATION STATUS NOTIFICATION   Patient Details  Name: KIYLEE THORESON MRN: [id number] Date of Birth: 07-16-1946   Medicare Observation Status Notification Given:  Yes    Malcolm Metro, RN 12/25/2015, 2:01 PM

## 2015-12-25 NOTE — Progress Notes (Signed)
PT Cancellation Note  Patient Details Name: Angel Torres MRN: [id number] DOB: 1946-11-19   Cancelled Treatment:    Reason Eval/Treat Not Completed: Other (comment). Treatment attempted, RN consulted: pt has just gone out for procedure. Will attempt PT evaluation at later date time.    1:44 PM, 12/25/2015 Rosamaria Lints, PT, DPT PRN Physical Therapist at [facility] [place] License # [id number] [phone] (wireless)  [phone] (mobile)

## 2015-12-25 NOTE — Progress Notes (Signed)
PT Cancellation Note  Patient Details Name: Angel Torres MRN: [id number] DOB: 10-18-1946   Cancelled Treatment:    Reason Eval/Treat Not Completed: Other (comment). Chart reviewed: Plan showing pt to undergo thoracentesis this AM. Will hold until after procedure to evaluate patient. Will follow and attempt at later date/time.     8:46 AM, 12/25/2015 Rosamaria Lints, PT, DPT PRN Physical Therapist at [facility] [place] License # [id number] [phone] (wireless)  [phone] (mobile)

## 2015-12-26 ENCOUNTER — Observation Stay (HOSPITAL_COMMUNITY): Payer: Medicare Other

## 2015-12-26 DIAGNOSIS — I5023 Acute on chronic systolic (congestive) heart failure: Secondary | ICD-10-CM | POA: Diagnosis not present

## 2015-12-26 DIAGNOSIS — Q909 Down syndrome, unspecified: Secondary | ICD-10-CM

## 2015-12-26 DIAGNOSIS — I482 Chronic atrial fibrillation: Secondary | ICD-10-CM | POA: Diagnosis present

## 2015-12-26 DIAGNOSIS — Z86711 Personal history of pulmonary embolism: Secondary | ICD-10-CM | POA: Diagnosis not present

## 2015-12-26 DIAGNOSIS — E119 Type 2 diabetes mellitus without complications: Secondary | ICD-10-CM | POA: Diagnosis present

## 2015-12-26 DIAGNOSIS — I110 Hypertensive heart disease with heart failure: Secondary | ICD-10-CM | POA: Diagnosis present

## 2015-12-26 DIAGNOSIS — R0602 Shortness of breath: Secondary | ICD-10-CM | POA: Diagnosis not present

## 2015-12-26 DIAGNOSIS — L03115 Cellulitis of right lower limb: Secondary | ICD-10-CM | POA: Diagnosis not present

## 2015-12-26 DIAGNOSIS — I10 Essential (primary) hypertension: Secondary | ICD-10-CM

## 2015-12-26 DIAGNOSIS — L03116 Cellulitis of left lower limb: Secondary | ICD-10-CM | POA: Diagnosis present

## 2015-12-26 DIAGNOSIS — J90 Pleural effusion, not elsewhere classified: Secondary | ICD-10-CM | POA: Diagnosis not present

## 2015-12-26 DIAGNOSIS — I509 Heart failure, unspecified: Secondary | ICD-10-CM | POA: Diagnosis not present

## 2015-12-26 DIAGNOSIS — I272 Other secondary pulmonary hypertension: Secondary | ICD-10-CM | POA: Diagnosis present

## 2015-12-26 DIAGNOSIS — Z7984 Long term (current) use of oral hypoglycemic drugs: Secondary | ICD-10-CM | POA: Diagnosis not present

## 2015-12-26 DIAGNOSIS — Z7901 Long term (current) use of anticoagulants: Secondary | ICD-10-CM | POA: Diagnosis not present

## 2015-12-26 DIAGNOSIS — I5043 Acute on chronic combined systolic (congestive) and diastolic (congestive) heart failure: Secondary | ICD-10-CM | POA: Diagnosis present

## 2015-12-26 LAB — GLUCOSE, CAPILLARY
GLUCOSE-CAPILLARY: 91 mg/dL (ref 65–99)
GLUCOSE-CAPILLARY: 100 mg/dL — AB (ref 65–99)
GLUCOSE-CAPILLARY: 138 mg/dL — AB (ref 65–99)
Glucose-Capillary: 107 mg/dL — ABNORMAL HIGH (ref 65–99)

## 2015-12-26 LAB — BASIC METABOLIC PANEL
Anion gap: 9 (ref 5–15)
BUN: 16 mg/dL (ref 6–20)
CHLORIDE: 103 mmol/L (ref 101–111)
CO2: 23 mmol/L (ref 22–32)
CREATININE: 0.53 mg/dL (ref 0.44–1.00)
Calcium: 8.7 mg/dL — ABNORMAL LOW (ref 8.9–10.3)
GFR calc non Af Amer: >60 mL/min (ref >60)
GLUCOSE: 116 mg/dL — AB (ref 65–99)
Potassium: 4.1 mmol/L (ref 3.5–5.1)
Sodium: 135 mmol/L (ref 135–145)

## 2015-12-26 LAB — PH, BODY FLUID: pH, Body Fluid: 7.8

## 2015-12-26 LAB — T4, FREE: FREE T4: 1.25 ng/dL — AB (ref 0.61–1.12)

## 2015-12-26 LAB — TSH: TSH: 0.646 u[IU]/mL (ref 0.350–4.500)

## 2015-12-26 LAB — PROTIME-INR
INR: 2.45 — ABNORMAL HIGH (ref 0.00–1.49)
Prothrombin Time: 26.3 seconds — ABNORMAL HIGH (ref 11.6–15.2)

## 2015-12-26 MED ORDER — IOHEXOL 300 MG/ML  SOLN
75.0000 mL | Freq: Once | INTRAMUSCULAR | Status: AC | PRN
  Administered 2015-12-26: 75 mL via INTRAVENOUS

## 2015-12-26 MED ORDER — WARFARIN SODIUM 5 MG PO TABS
6.0000 mg | ORAL_TABLET | Freq: Once | ORAL | Status: AC
  Administered 2015-12-26: 6 mg via ORAL
  Filled 2015-12-26: qty 1

## 2015-12-26 NOTE — Progress Notes (Signed)
ANTICOAGULATION CONSULT NOTE - Initial Consult  Pharmacy Consult for Warfarin Indication: atrial fibrillation  No Known Allergies  Patient Measurements: Height:  (165.1 cm) Weight: 147 lb 4.3 oz (66.8 kg) IBW/kg (Calculated) : 57   Vital Signs: Temp: 98.7 F (37.1 C) (02/02 0500) Temp Source: Oral (02/02 0500) BP: 124/61 mmHg (02/02 0928) Pulse Rate: 87 (02/02 0928)  Labs:  Recent Labs  12/24/15 1700 12/25/15 0544 12/26/15 0530  HGB 15.9* 14.0  --   HCT 47.2* 43.3  --   PLT 156 180  --   LABPROT 29.4* 26.3* 26.3*  INR 2.84* 2.45* 2.45*  CREATININE 0.78 0.48 0.53  TROPONINI 0.03  --   --     Estimated Creatinine Clearance: 58.9 mL/min (by C-G formula based on Cr of 0.53).   Medical History: Past Medical History  Diagnosis Date  . Down syndrome   . Hypertension   . CHF (congestive heart failure) (HCC)   . Atrial fibrillation (HCC)   . Pneumonia   . Pulmonary embolism (HCC)   . Leg fracture   . Systolic CHF (HCC)   . Shortness of breath   . Pulmonary hypertension (HCC) 12/25/2015  . 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 alendronate (FOSAMAX) 70 MG tablet Take 70 mg by mouth once a week. Takes on Mondays. Take with a full glass of water on an empty stomach.   12/23/2015 at Unknown time  . betamethasone dipropionate (DIPROLENE) 0.05 % cream Apply 1 application topically 2 (two) times daily as needed (for itching).   unknown  . Calcium Carbonate (CALCIUM 600 PO) Take 600 mg by mouth daily.   12/24/2015 at 800a  . digoxin (LANOXIN) 0.25 MG tablet Take 250 mcg by mouth daily.   12/24/2015 at 800a  . diltiazem (CARDIZEM) 120 MG tablet Take 120 mg by mouth 3 (three) times daily.   12/24/2015 at 1400  . furosemide (LASIX) 40 MG tablet Take 40 mg by mouth daily.    12/24/2015 at 800a  . glipiZIDE (GLUCOTROL) 5 MG tablet Take 5 mg by mouth 2 (two) times daily before a  meal.   12/23/2015 at 1700  . lisinopril (PRINIVIL,ZESTRIL) 5 MG tablet Take 5 mg by mouth daily.   12/24/2015 at 800a  . metFORMIN (GLUCOPHAGE-XR) 500 MG 24 hr tablet Take 500 mg by mouth daily.    12/24/2015 at 800a  . metoprolol tartrate (LOPRESSOR) 25 MG tablet Take 25 mg by mouth 2 (two) times daily.   12/24/2015 at 800a  . triamcinolone cream (KENALOG) 0.1 % Apply 1 application topically 2 (two) times daily.   12/23/2015 at 2000  . Vitamin D, Cholecalciferol, 400 UNITS TABS Take 400 Units by mouth daily.   12/24/2015 at 800  . warfarin (COUMADIN) 2 MG tablet Take 1-2 tablets by mouth See admin instructions. Take 1 tablet by mouth on Tuesday, Thursday and Saturday.  Take (1/2) tablet by mouth on Sunday, Monday, Wednesday, and Friday.   12/15/2015 at Unknown time  . warfarin (COUMADIN) 6 MG tablet Take 6 mg by mouth every evening.   12/23/2015 at 1700    Assessment: Okay for Protocol, INR at goal and remains stable.  No bleeding noted.  right-sided pleural effusion noted. amber fluid removed from thoracentesis yesterday.   Goal of Therapy:  INR 2-3   Plan:  Warfarin  PO x 1 today Daily PT/INR Monitor for signs and symptoms  of bleeding.   Elder Cyphers, BS Loura Back, BCPS Clinical Pharmacist Pager [phone] 12/26/2015,10:35 AM

## 2015-12-26 NOTE — Plan of Care (Signed)
Problem: Acute Rehab PT Goals(only PT should resolve) Goal: Patient Will Transfer Sit To/From Stand Pt will transfer sit to/from-stand with RW at ModI without loss-of-balance to demonstrate good safety awareness for independent mobility in home.     Goal: Pt Will Ambulate Pt will ambulate with RW at Supervision using a step-through pattern and equal step length for a distance greater than 263ft to demonstrate the ability to perform safe household distance ambulation at discharge.

## 2015-12-26 NOTE — Progress Notes (Signed)
[facility] PROGRESS NOTE  Angel Torres ZOX:[id number] DOB: Jul 10, 1946 DOA: 12/24/2015 PCP: Kirk Ruths, MD    Code Status: Full code Family Communication: Discussed with her brother, Rocky Link. Disposition Plan: Discharge when clinically appropriate, possibly in 24-48 hours.   Consultants:  None  Procedures:  Thoracentesis, pending  2-D echocardiogram Study Conclusions:  FROM 12/08/15  - Left ventricle: The cavity size was normal. There was mild concentric hypertrophy. The estimated ejection fraction was 48%. Diffuse hypokinesis. The study is not technically sufficient to allow evaluation of LV diastolic function. Ejection fraction (MOD, 2-plane): 48%. - Ventricular septum: Septal motion showed paradox. The contour showed diastolic flattening. - Mitral valve: Mildly thickened leaflets . There was mild regurgitation. - Left atrium: The atrium was at the upper limits of normal in size. - Right ventricle: The cavity size was moderately dilated. Systolic function is reduced. - Right atrium: The atrium was mildly dilated. - Tricuspid valve: There was moderate regurgitation. - Pulmonary arteries: PA peak pressure: 52 mm Hg (S). - Inferior vena cava: The vessel was dilated. The respirophasic diameter changes were blunted (< 50%), consistent with elevated central venous pressure. Impressions: - Compared to a prior echo in 2010, LVEF is higher at 48%. There is more significant right heart enlargement, RV hypokinesis, moderate pulmoanry hypertension with signs of volume and pressure overload, RVSP of 52 mmHg, moderate TR and dilated IVC.  Antibiotics:  Vancomycin 12/25/15>>  HPI/Subjective: Patient has no complaints of chest pain, shortness of breath, or leg pain. Her brother Rocky Link says that she looks better.  Objective: Filed Vitals:   12/26/15 0916 12/26/15 0928  BP:  124/61  Pulse: 105 87  Temp:    Resp: 26    oxygen saturation 96%  on room air.  Intake/Output Summary (Last 24 hours) at 12/26/15 1135 Last data filed at 12/25/15 1700  Gross per 24 hour  Intake    360 ml  Output      0 ml  Net    360 ml   Filed Weights   12/24/15 2113 12/25/15 0448 12/26/15 0500  Weight: 68.2 kg (150 lb 5.7 oz) 68.1 kg (150 lb 2.1 oz) 66.8 kg (147 lb 4.3 oz)    Exam:   General:  70--year-old woman in no acute distress; sitting up in the chair.  Cardiovascular: Irregular, irregular, with resolved bradycardia  Respiratory: Better air movement on the right with occasional crackles; clear on the left; breathing nonlabored at rest.  Abdomen: Positive bowel sounds, soft, nontender, nondistended.  Musculoskeletal/extremities: Edema has decreased to trace bilateral lower extremity; significantly less erythema of the right leg and scant erythema of the left leg.  Neurologic: Patient is alert and oriented to herself, hospital, and brother. She is hard of hearing.  Data Reviewed: Basic Metabolic Panel:  Recent Labs Lab 12/24/15 1700 12/25/15 0544 12/26/15 0530  NA 134* 134* 135  K 4.3 3.9 4.1  CL 97* 102 103  CO2 27 24 23   GLUCOSE 119* 117* 116*  BUN 17 16 16   CREATININE 0.78 0.48 0.53  CALCIUM 9.6 8.6* 8.7*  MG  --  1.9  --    Liver Function Tests:  Recent Labs Lab 12/24/15 1700  AST 33  ALT 26  ALKPHOS 143*  BILITOT 1.1  PROT 7.5  ALBUMIN 4.2   No results for input(s): LIPASE, AMYLASE in the last 168 hours. No results for input(s): AMMONIA in the last 168 hours. CBC:  Recent Labs Lab 12/24/15 1700 12/25/15 0544  WBC 7.5 6.8  NEUTROABS 5.7 5.1  HGB 15.9* 14.0  HCT 47.2* 43.3  MCV 87.7 88.2  PLT 156 180   Cardiac Enzymes:  Recent Labs Lab 12/24/15 1700  TROPONINI 0.03   BNP (last 3 results)  Recent Labs  12/07/15 0752 12/24/15 1700  BNP 760.0* 563.0*    ProBNP (last 3 results) No results for input(s): PROBNP in the last 8760 hours.  CBG:  Recent Labs Lab 12/25/15 0758  12/25/15 1140 12/25/15 1713 12/25/15 2211 12/26/15 0748  GLUCAP 109* 104* 134* 145* 107*    Recent Results (from the past 240 hour(s))  Culture, body fluid-bottle     Status: None (Preliminary result)   Collection Time: 12/25/15  1:30 PM  Result Value Ref Range Status   Specimen Description FLUID RIGHT PLEURAL COLLECTED BY DOCTOR BOLES  Final   Special Requests BOTTLES DRAWN AEROBIC AND ANAEROBIC 10CC EACH  Final   Culture NO GROWTH < 24 HOURS  Final   Report Status PENDING  Incomplete  Gram stain     Status: None   Collection Time: 12/25/15  1:30 PM  Result Value Ref Range Status   Specimen Description FLUID RIGHT PLEURAL COLLECTED BY DOCTOR BOLES  Final   Special Requests NONE  Final   Gram Stain   Final    CYTOSPIN SMEAR WBC PRESENT, PREDOMINANTLY MONONUCLEAR NO ORGANISMS SEEN Performed at [facility]    Report Status 12/25/2015 FINAL  Final     Studies: Dg Chest 1 View  12/25/2015  CLINICAL DATA:  Post thoracentesis. EXAM: CHEST 1 VIEW COMPARISON:  12/24/2015 FINDINGS: Cardiomediastinal silhouette is enlarged. Mediastinal contours appear intact. There is no evidence of pneumothorax. There is decreased right pleural effusion. Bibasilar atelectasis is seen. Osseous structures are without acute abnormality. Soft tissues are grossly normal. IMPRESSION: Decreased right pleural effusion status post thoracentesis. No evidence of pneumothorax. Enlarged cardiac silhouette. Bibasilar atelectasis. Electronically Signed   By: Ted Mcalpine M.D.   On: 12/25/2015 14:14   Dg Chest 2 View  12/24/2015  CLINICAL DATA:  Leg swelling. History of CHF. Prior right thoracentesis. EXAM: CHEST  2 VIEW COMPARISON:  12/09/2015 FINDINGS: Increased densities throughout the right chest is compatible with a large pleural effusion. Again noted is enlargement of the cardiac silhouette. Subtle interstitial densities throughout both lungs may represent mild pulmonary edema. Again noted is a nodular  density in the left lower chest most likely related to a nipple shadow. Degenerative changes near the thoracolumbar spine and cannot exclude a compression fracture in this area. IMPRESSION: Re-accumulation of right pleural fluid. There is a large right pleural effusion. Cardiomegaly and cannot exclude mild interstitial pulmonary edema. Electronically Signed   By: Richarda Overlie M.D.   On: 12/24/2015 17:43   US Venous Img Lower Unilateral Right  12/24/2015  CLINICAL DATA:  Right lower extremity pain and swelling for the past 2 weeks. History of pulmonary embolism. Evaluate for DVT EXAM: RIGHT LOWER EXTREMITY VENOUS DOPPLER ULTRASOUND TECHNIQUE: Gray-scale sonography with graded compression, as well as color Doppler and duplex ultrasound were performed to evaluate the lower extremity deep venous systems from the level of the common femoral vein and including the common femoral, femoral, profunda femoral, popliteal and calf veins including the posterior tibial, peroneal and gastrocnemius veins when visible. The superficial great saphenous vein was also interrogated. Spectral Doppler was utilized to evaluate flow at rest and with distal augmentation maneuvers in the common femoral, femoral and popliteal veins. COMPARISON:  None. FINDINGS: Contralateral Common Femoral Vein: Respiratory phasicity  is normal and symmetric with the symptomatic side. No evidence of thrombus. Normal compressibility. Common Femoral Vein: No evidence of thrombus. Normal compressibility, respiratory phasicity and response to augmentation. Saphenofemoral Junction: No evidence of thrombus. Normal compressibility and flow on color Doppler imaging. Profunda Femoral Vein: No evidence of thrombus. Normal compressibility and flow on color Doppler imaging. Femoral Vein: No evidence of thrombus. Normal compressibility, respiratory phasicity and response to augmentation. Popliteal Vein: No evidence of thrombus. Normal compressibility, respiratory phasicity  and response to augmentation. Calf Veins: No evidence of thrombus. Normal compressibility and flow on color Doppler imaging. Superficial Great Saphenous Vein: No evidence of thrombus. Normal compressibility and flow on color Doppler imaging. Venous Reflux:  None. Other Findings:  None. IMPRESSION: No evidence of right lower extremity DVT. Electronically Signed   By: Simonne Come M.D.   On: 12/24/2015 17:49   US Thoracentesis Asp Pleural Space W/img Guide  12/25/2015  INDICATION: Recurrent RIGHT pleural effusion EXAM: ULTRASOUND GUIDED DIAGNOSTIC AND THERAPEUTIC RIGHT THORACENTESIS PROCEDURE: Procedure, benefits, and risks of procedure were discussed with patient. Written informed consent for procedure was obtained. Time out protocol followed. Pleural effusion localized by ultrasound at the posterior RIGHT hemithorax. Skin prepped and draped in usual sterile fashion. Skin and soft tissues anesthetized with 9 mL of 1% lidocaine. 8 French thoracentesis catheter placed into the RIGHT pleural space. 1500 mL of amber color fluid was aspirated by syringe pump. Procedure tolerated well by patient without immediate complication. FINDINGS: A total of approximately 1500 mL of RIGHT pleural fluid was removed. A fluid sample of 180 mL was sent for laboratory analysis. IMPRESSION: Successful ultrasound guided RIGHT thoracentesis yielding 1500 mL of pleural fluid. Electronically Signed   By: Ulyses Southward M.D.   On: 12/25/2015 14:18    Scheduled Meds: . digoxin  250 mcg Oral Daily  . diltiazem  60 mg Oral TID  . furosemide  60 mg Intravenous Daily  . insulin aspart  0-15 Units Subcutaneous TID WC  . insulin aspart  0-5 Units Subcutaneous QHS  . lisinopril  5 mg Oral Daily  . metoprolol tartrate  25 mg Oral BID  . sodium chloride flush  3 mL Intravenous Q12H  . vancomycin  1,250 mg Intravenous Q24H  . warfarin  6 mg Oral Once  . Warfarin - Pharmacist Dosing Inpatient   Does not apply Q24H   Continuous Infusions:    Assessment and plan:  Principal Problem:   Recurrent right pleural effusion Active Problems:   Biventricular heart failure (HCC)   Acute on chronic systolic heart failure (HCC)   Pulmonary hypertension (HCC)   Chronic atrial fibrillation (HCC)   Down's syndrome   Essential hypertension   Type 2 diabetes mellitus (HCC)   Cellulitis of right lower extremity    1. Recurrent large right pleural effusion. The patient was admitted with shortness of breath. Her chest x-ray on admission revealed a large right pleural effusion. She was recently hospitalized from 12/07/2015 through 12/10/2015 for the same. The etiology was thought to be secondary to decompensated systolic heart failure. She underwent a thoracentesis on 1/16 with 1.5 L removed. No studies were done at that time. -During this hospitalization, another thoracentesis was performed on 12/25/15. It yielded 1.5 L of yellow fluid. The fluid analysis appears to be more consistent with a transudate. Gram stain reveals predominantly monocytes, but no organisms seen. Cell count revealed 213 WBCs with 28% lymphocytes. Fluid culture, fluid pH, and fluid cytology pending. -Etiology possibly secondary to decompensated heart  failure. Will order additional studies to assess for additional etiologies: CT scan of the chest with contrast, and 8, rheumatoid factor. -Of note her TSH and free T4 were within normal limits. Her sedimentation rate was within normal limits.  Biventricular heart failure and pulmonary hypertension. Patient was restarted on Lasix, Lanoxin, lisinopril, diltiazem and metoprolol. She underwent an echocardiogram evaluation during the previous hospitalization on 12/08/15. It revealed an EF of 48%, LV and RV hypokinesis, and moderate pulmonary hypertension. -Patient was given a total of 60 mg of IV Lasix following admission, followed by an increased dose of oral Lasix. - Lasix change back to IV; monitor ins and outs and daily  weights-patient is incontinent, but will hold off on ordering an indwelling Foley catheter. Her weight is down 3 pounds.  Chronic atrial fibrillation. Patient was restarted on metoprolol and diltiazem for rate control and warfarin for anticoagulation. -Her heart rate was somewhat bradycardic, so parameters to hold metoprolol and diltiazem for heart rate of 55 or less was ordered. Diltiazem decreased from 120 3 times a day to 60 3 times a day. Metoprolol unchanged. Her heart rate is better. We'll continue to monitor. -Coumadin dosing per pharmacy.  Type 2 diabetes mellitus. Patient is treated with metformin and glipizide chronically. They are on hold. We'll continue sliding scale NovoLog. Her CBGs are reasonable.  R>L lower extremity cellulitis. Vancomycin was started. There has been a decrease in cellulitic erythema.   Time spent: 35 minutes     Bassam Dresch  [facility] Pager: [phone] . If 7PM-7AM, please contact night-coverage at [email or web address], password [facility] 12/26/2015, 11:35 AM

## 2015-12-26 NOTE — Evaluation (Signed)
Physical Therapy Evaluation Patient Details Name: Angel Torres MRN: [id number] DOB: Mar 25, 1946 Today's Date: 12/26/2015   History of Present Illness  69yo white female who resides at ALF, comes to [facility] p bilateral leg swelling: pt found to have pulmonary effusion, admitted for a similar issue in January. PMH: HTN, diastolic heart failure, afib, DM. Brother also reports pt sustained a fall and lower leg fracture and has used a rollator for facility ambulation since.   Clinical Impression  Pt demonstrating modified indep in bed mobility, requires some supervision for transfers from an elevated surface, and supervision for ambulation with RW, with decreased activity tolerance compared to baseline, distance limited by estimated 50-75%. SaO2 remains >91% with all activity, in spite of mild increase in tachypnea with activity. Pt is a high falls risk as evidenced by high falls anxiety, history of falls, slow gait speed, and multiple LOB within session. Pt will benefit from skilled PT intervention to address these impairments and limitations, and to help patient return to PLOF in facility mobility.     Follow Up Recommendations Home health PT;Supervision for mobility/OOB (Return to [facility] c HHPT services. )    Equipment Recommendations  None recommended by PT    Recommendations for Other Services       Precautions / Restrictions Precautions Precautions: Fall Restrictions Weight Bearing Restrictions: No      Mobility  Bed Mobility Overal bed mobility: Modified Independent                Transfers Overall transfer level: Needs assistance Equipment used: Rolling walker (2 wheeled) Transfers: Sit to/from Stand Sit to Stand: From elevated surface;Supervision         General transfer comment: Some difficulty from a lower surface.   Ambulation/Gait Ambulation/Gait assistance: Min guard Ambulation Distance (Feet): 100 Feet Assistive device: Rolling walker (2 wheeled)      Gait velocity interpretation: <1.8 ft/sec, indicative of risk for recurrent falls General Gait Details: Slow and steady, appears fairly weak with decreased step length and weight shift bilat. Pt reports to feel weak compared to baseline. Some tachypnea which brother reports as baseline. SaO2 remains stable.   Stairs            Wheelchair Mobility    Modified Rankin (Stroke Patients Only)       Balance Overall balance assessment: No apparent balance deficits (not formally assessed);History of Falls                                           Pertinent Vitals/Pain Pain Assessment: No/denies pain    Home Living Family/patient expects to be discharged to:: Assisted living               Home Equipment: Walker - 4 wheels      Prior Function Level of Independence: Needs assistance   Gait / Transfers Assistance Needed: Pt uses walker for functional mobility; Pt brother reports she is "paralyzed" without her walker  ADL's / Homemaking Assistance Needed: Pt requires some assistance for dressing and bathing tasks.         Hand Dominance   Dominant Hand: Right    Extremity/Trunk Assessment   Upper Extremity Assessment: Defer to OT evaluation           Lower Extremity Assessment: Generalized weakness         Communication   Communication: [facility]  Cognition Arousal/Alertness: Awake/alert Behavior During Therapy: WFL for tasks assessed/performed Overall Cognitive Status: History of cognitive impairments - at baseline                      General Comments      Exercises Other Exercises Other Exercises: Chair squats c walker x10 with supervision for safety.       Assessment/Plan    PT Assessment Patient needs continued PT services  PT Diagnosis Difficulty walking;Generalized weakness   PT Problem List Decreased strength;Decreased activity tolerance;Decreased balance;Cardiopulmonary status limiting activity  PT Treatment  Interventions Gait training;Therapeutic exercise;Balance training;Therapeutic activities;Patient/family education   PT Goals (Current goals can be found in the Care Plan section) Acute Rehab PT Goals Patient Stated Goal: Improve strength, energy, and functional capacity.  PT Goal Formulation: With patient/family Time For Goal Achievement: 01/09/16 Potential to Achieve Goals: Good    Frequency Min 3X/week   Barriers to discharge        Co-evaluation               End of Session Equipment Utilized During Treatment: Gait belt Activity Tolerance: Patient tolerated treatment well;Patient limited by fatigue Patient left: in bed;with call bell/phone within reach;with bed alarm set;with family/visitor present Nurse Communication: Other (comment)    Functional Limitation: Mobility: Walking and moving around Mobility: Walking and Moving Around Current Status ([id number]): At least 20 percent but less than 40 percent impaired, limited or restricted Mobility: Walking and Moving Around Goal Status [phone]): At least 20 percent but less than 40 percent impaired, limited or restricted    Time: 0856-0911 PT Time Calculation (min) (ACUTE ONLY): 15 min   Charges:   PT Evaluation $PT Eval Moderate Complexity: 1 Procedure PT Treatments $Therapeutic Activity: 8-22 mins   PT G Codes:   PT G-Codes **NOT FOR INPATIENT CLASS** Functional Limitation: Mobility: Walking and moving around Mobility: Walking and Moving Around Current Status (U9811): At least 20 percent but less than 40 percent impaired, limited or restricted Mobility: Walking and Moving Around Goal Status [phone]): At least 20 percent but less than 40 percent impaired, limited or restricted    9:30 AM, 12/26/2015 Rosamaria Lints, PT, DPT PRN Physical Therapist at [facility] [place] License # [id number] [phone] (wireless)  [phone] (mobile)

## 2015-12-27 ENCOUNTER — Encounter (HOSPITAL_COMMUNITY): Payer: Self-pay | Admitting: Internal Medicine

## 2015-12-27 LAB — EXTRACTABLE NUCLEAR ANTIGEN ANTIBODY
Ribonucleic Protein: <0.2 AI (ref 0.0–0.9)
SSA (Ro) (ENA) Antibody, IgG: <0.2 AI (ref 0.0–0.9)
Scleroderma (Scl-70) (ENA) Antibody, IgG: <0.2 AI (ref 0.0–0.9)
ds DNA Ab: 5 IU/mL (ref 0–9)

## 2015-12-27 LAB — GLUCOSE, CAPILLARY
GLUCOSE-CAPILLARY: 104 mg/dL — AB (ref 65–99)
GLUCOSE-CAPILLARY: 154 mg/dL — AB (ref 65–99)

## 2015-12-27 LAB — BASIC METABOLIC PANEL
Anion gap: 9 (ref 5–15)
BUN: 18 mg/dL (ref 6–20)
CALCIUM: 8.7 mg/dL — AB (ref 8.9–10.3)
CHLORIDE: 103 mmol/L (ref 101–111)
CO2: 25 mmol/L (ref 22–32)
CREATININE: 0.53 mg/dL (ref 0.44–1.00)
Glucose, Bld: 112 mg/dL — ABNORMAL HIGH (ref 65–99)
Potassium: 4.1 mmol/L (ref 3.5–5.1)
SODIUM: 137 mmol/L (ref 135–145)

## 2015-12-27 LAB — CBC
HCT: 46.5 % — ABNORMAL HIGH (ref 36.0–46.0)
Hemoglobin: 15.2 g/dL — ABNORMAL HIGH (ref 12.0–15.0)
MCH: 28.6 pg (ref 26.0–34.0)
MCHC: 32.7 g/dL (ref 30.0–36.0)
MCV: 87.6 fL (ref 78.0–100.0)
PLATELETS: 183 10*3/uL (ref 150–400)
RBC: 5.31 MIL/uL — AB (ref 3.87–5.11)
RDW: 14.8 % (ref 11.5–15.5)
WBC: 7.4 10*3/uL (ref 4.0–10.5)

## 2015-12-27 LAB — PROTIME-INR
INR: 2.17 — AB (ref 0.00–1.49)
Prothrombin Time: 24 seconds — ABNORMAL HIGH (ref 11.6–15.2)

## 2015-12-27 LAB — ANTI-JO 1 ANTIBODY, IGG

## 2015-12-27 LAB — RHEUMATOID FACTOR

## 2015-12-27 MED ORDER — WARFARIN SODIUM 5 MG PO TABS: 8.0000 mg | ORAL_TABLET | Freq: Once | ORAL | Status: DC

## 2015-12-27 MED ORDER — DILTIAZEM HCL 60 MG PO TABS: 60.0000 mg | ORAL_TABLET | Freq: Three times a day (TID) | ORAL | Status: AC

## 2015-12-27 MED ORDER — CEPHALEXIN 500 MG PO CAPS: 500.0000 mg | ORAL_CAPSULE | Freq: Two times a day (BID) | ORAL | Status: DC

## 2015-12-27 MED ORDER — FUROSEMIDE 40 MG PO TABS: 80.0000 mg | ORAL_TABLET | Freq: Every day | ORAL | Status: DC

## 2015-12-27 MED ORDER — METFORMIN HCL ER 500 MG PO TB24: 500.0000 mg | ORAL_TABLET | Freq: Every day | ORAL | Status: AC

## 2015-12-27 MED ORDER — GLIPIZIDE 5 MG PO TABS: 5.0000 mg | ORAL_TABLET | Freq: Two times a day (BID) | ORAL | Status: AC

## 2015-12-27 NOTE — Discharge Summary (Addendum)
Physician Discharge Summary  Angel Torres ZOX:[id number] DOB: [birth year] DOA: 12/24/2015  PCP: Kirk Ruths, MD  Admit date: 12/24/2015 Discharge date: 12/27/2015  Time spent: GREATER THAN 30 minutes  Recommendations for Outpatient Follow-up:  1. Patient should be weighed daily. If her weight increases more than 2 pounds, call her physician. Keep patient's legs elevated at rest. 2.  ANA lab test and pleural cytology were pending at the time of discharge. 3. Patient should have her CBG checked daily. 4. Home health physical therapy ordered at discharge.   Discharge Diagnoses:  1. Recurrent right pleural effusion with working diagnosis of acute on chronic systolic heart failure. 2. Biventricular heart failure with exacerbation. 3. Chronic atrial fibrillation, on Coumadin. 4. Essential hypertension. 5. Pulmonary hypertension. 6. Right greater than left lower extremity cellulitis. 7. Type 2 diabetes mellitus. 8. Down syndrome.  Discharge Condition: Improved.  Diet recommendation: NO CONCENTRATED SWEETS AND NO SALT AT THE TABLE.  Filed Weights   12/25/15 0448 12/26/15 0500 12/27/15 0554  Weight: 68.1 kg (150 lb 2.1 oz) 66.8 kg (147 lb 4.3 oz) 65.1 kg (143 lb 8.3 oz)    History of present illness:  Patient is a 70 year old with a history of Down syndrome, hypertension, chronic atrial fibrillation on warfarin, systolic dysfunction, and right pleural effusion. She presented to the ED on 12/24/2015 with a chief complaint of shortness of breath and swelling and redness of both legs. Chest x-ray revealed recurrent large right pleural effusion. She was admitted for further evaluation and management.  Hospital Course:  1. Recurrent large right pleural effusion. The patient was admitted with shortness of breath. Her chest x-ray on admission revealed a large right pleural effusion. She was recently hospitalized from 12/07/2015 through 12/10/2015 for the same. The etiology was thought to be  secondary to decompensated systolic heart failure. She underwent a thoracentesis on 1/16 with 1.5 L removed. No studies were done at that time. -During this hospitalization, another thoracentesis was performed on 12/25/15. It yielded 1.5 L of yellow fluid. The fluid analysis appeared to be more consistent with a transudate. Gram stain revealed predominantly monocytes, but no organisms seen. Cell count revealed 213 WBCs with 28% lymphocytes. Fluid culture was negative to date. Fluid pH was 7.8.  Fluid cytology was pending. -Working etiology is that the recurrent pleural effusion was secondary to decompensated heart failure. Additional studies were ordered for other possible etiologies. CT scan of the chest with contrast revealed moderate cardiomegaly, bilateral central airspace attenuation likely from mild edema, moderate size right pleural effusion. Her TSH and free T4 were within normal limits. Sedimentation rate was within normal limits. Rheumatoid factor was negative. ANA panel was pending at the time of discharge. -Patient diuresed, but she was incontinent. She was discharged on a higher home dose of Lasix at 80 mg daily.  Biventricular heart failure and pulmonary hypertension. Patient was restarted on Lasix, Lanoxin, lisinopril, diltiazem and metoprolol. She underwent an echocardiogram evaluation during the previous hospitalization on 12/08/15. It revealed an EF of 48%, LV and RV hypokinesis, and moderate pulmonary hypertension. -Patient was started on IV Lasix 60 mg daily. Foley catheter was not inserted, so her ins and outs were not recorded accurately. Her daily weights were measured. Her weight on admission was 155 pounds. Her weight at the time of discharge was 143.8 pounds. -She had resolution of her lower extremity edema. -A new outpatient appointment will be scheduled for her to follow-up with cardiology for further management recommendations.  Chronic atrial fibrillation. Patient was  restarted on metoprolol and diltiazem for rate control and warfarin for anticoagulation. -Her heart rate was  bradycardic, so parameters to hold metoprolol and diltiazem for heart rate of 55 or less were ordered. Diltiazem decreased from 120 three times a day to 60 three times a day. Metoprolol remained and digoxin unchanged. Her heart rate is better. Her heart rate improved. -Coumadin was dosed per pharmacy. However, the patient's INR remained within therapeutic range.  Type 2 diabetes mellitus. Patient is treated with metformin and glipizide chronically. They were hold. She was treated with sliding scale NovoLog. Her CBGs were control. Metformin and glipizide will be started upon discharge, but metformin should be held until 12/28/15 after patient received IV contrast for the CT scan.  R>L lower extremity cellulitis. Vancomycin was started. There was improvement and near resolution of the cellulitis. She was discharged on 3 more days of Keflex.     Procedures:  Thoracentesis, 12/25/2015 yielding 1500 ml amber-colored fluid.  2-D echocardiogram Study Conclusions: FROM 12/08/15  - Left ventricle: The cavity size was normal. There was mild concentric hypertrophy. The estimated ejection fraction was 48%. Diffuse hypokinesis. The study is not technically sufficient to allow evaluation of LV diastolic function. Ejection fraction (MOD, 2-plane): 48%. - Ventricular septum: Septal motion showed paradox. The contour showed diastolic flattening. - Mitral valve: Mildly thickened leaflets . There was mild regurgitation. - Left atrium: The atrium was at the upper limits of normal in size. - Right ventricle: The cavity size was moderately dilated. Systolic function is reduced. - Right atrium: The atrium was mildly dilated. - Tricuspid valve: There was moderate regurgitation. - Pulmonary arteries: PA peak pressure: 52 mm Hg (S). - Inferior vena cava: The vessel was dilated. The  respirophasic diameter changes were blunted (< 50%), consistent with elevated central venous pressure. Impressions: - Compared to a prior echo in 2010, LVEF is higher at 48%. There is more significant right heart enlargement, RV hypokinesis, moderate pulmoanry hypertension with signs of volume and pressure overload, RVSP of 52 mmHg, moderate TR and dilated IVC.  Consultations:  None  Discharge Exam: Filed Vitals:   12/27/15 0554 12/27/15 0856  BP: 131/73 133/84  Pulse: 76 93  Temp: 98.1 F (36.7 C)   Resp: 20 20   General: 69--year-old woman in no acute distress.  Cardiovascular: Irregular, irregular, with resolved bradycardia  Respiratory: Better air movement on the right with rare crackles; clear on the left; breathing nonlabored at rest. Oxygen saturation 91-97% on room air.  Abdomen: Positive bowel sounds, soft, nontender, nondistended.  Musculoskeletal/extremities: Edema of bilateral lower extremities has resolved; significantly less erythema of the right leg and no erythema of the left leg. Neurologic: Patient is alert and oriented to herself, hospital, and brother. She is hard of hearing.   Discharge Instructions   Discharge Instructions    Diet - low sodium heart healthy    Complete by:  As directed      Diet general    Complete by:  As directed      Increase activity slowly    Complete by:  As directed           Current Discharge Medication List    START taking these medications   Details  cephALEXin (KEFLEX) 500 MG capsule Take 1 capsule (500 mg total) by mouth 2 (two) times daily. ANTIBIOTIC TO BE TAKEN FOR 3 MORE DAYS, STARTING ON 12/28/15. Qty: 6 capsule, Refills: 0      CONTINUE these medications which have CHANGED  Details  diltiazem (CARDIZEM) 60 MG tablet Take 1 tablet (60 mg total) by mouth 3 (three) times daily. Qty: 90 tablet, Refills: 3    furosemide (LASIX) 40 MG tablet Take 2 tablets (80 mg total) by mouth daily. Qty: 60  tablet, Refills: 3    glipiZIDE (GLUCOTROL) 5 MG tablet Take 1 tablet (5 mg total) by mouth 2 (two) times daily before a meal. HOLD IF BLOOD SUGAR IS LESS THAN 115.    metFORMIN (GLUCOPHAGE-XR) 500 MG 24 hr tablet Take 1 tablet (500 mg total) by mouth daily. DO NOT START UNTIL 12/28/2015.      CONTINUE these medications which have NOT CHANGED   Details  alendronate (FOSAMAX) 70 MG tablet Take 70 mg by mouth once a week. Takes on Mondays. Take with a full glass of water on an empty stomach.    betamethasone dipropionate (DIPROLENE) 0.05 % cream Apply 1 application topically 2 (two) times daily as needed (for itching).    Calcium Carbonate (CALCIUM 600 PO) Take 600 mg by mouth daily.    digoxin (LANOXIN) 0.25 MG tablet Take 250 mcg by mouth daily.    lisinopril (PRINIVIL,ZESTRIL) 5 MG tablet Take 5 mg by mouth daily.    metoprolol tartrate (LOPRESSOR) 25 MG tablet Take 25 mg by mouth 2 (two) times daily.    triamcinolone cream (KENALOG) 0.1 % Apply 1 application topically 2 (two) times daily.    Vitamin D, Cholecalciferol, 400 UNITS TABS Take 400 Units by mouth daily.    !! warfarin (COUMADIN) 2 MG tablet Take 1-2 tablets by mouth See admin instructions. Take 1 tablet by mouth on Tuesday, Thursday and Saturday.  Take (1/2) tablet by mouth on Sunday, Monday, Wednesday, and Friday.    !! warfarin (COUMADIN) 6 MG tablet Take 6 mg by mouth every evening.     !! - Potential duplicate medications found. Please discuss with provider.     No Known Allergies Follow-up Information    Follow up with Nona Dell, MD On 01/01/2016.   Specialty:  Cardiology   Why:  AT 1:40 PM   Contact information:   [address] [place] [address] [phone]       Follow up with MANN, BENJAMIN, PA-C.   Specialties:  Physician Assistant, Internal Medicine   Contact information:   [address] [place] [address] [phone]        The results of significant diagnostics from  this hospitalization (including imaging, microbiology, ancillary and laboratory) are listed below for reference.    Significant Diagnostic Studies: Dg Chest 1 View  12/25/2015  CLINICAL DATA:  Post thoracentesis. EXAM: CHEST 1 VIEW COMPARISON:  12/24/2015 FINDINGS: Cardiomediastinal silhouette is enlarged. Mediastinal contours appear intact. There is no evidence of pneumothorax. There is decreased right pleural effusion. Bibasilar atelectasis is seen. Osseous structures are without acute abnormality. Soft tissues are grossly normal. IMPRESSION: Decreased right pleural effusion status post thoracentesis. No evidence of pneumothorax. Enlarged cardiac silhouette. Bibasilar atelectasis. Electronically Signed   By: Ted Mcalpine M.D.   On: 12/25/2015 14:14   Dg Chest 1 View  12/09/2015  CLINICAL DATA:  Status post right thoracentesis EXAM: CHEST 1 VIEW COMPARISON:  12/07/2015 chest radiograph. FINDINGS: Stable cardiomediastinal silhouette with mild cardiomegaly. No pneumothorax. Small residual right pleural effusion, significantly decreased. No left pleural effusion. Stable mild pulmonary edema. Improved aeration in the right lung with residual passive atelectasis at the right lung base. IMPRESSION: 1. No pneumothorax. 2. Small residual right pleural effusion,  significantly decreased. 3. Improved aeration of the right lung, with residual passive atelectasis at the right lung base. 4. Stable mild congestive heart failure. Electronically Signed   By: Delbert Phenix M.D.   On: 12/09/2015 13:36   Dg Chest 2 View  12/24/2015  CLINICAL DATA:  Leg swelling. History of CHF. Prior right thoracentesis. EXAM: CHEST  2 VIEW COMPARISON:  12/09/2015 FINDINGS: Increased densities throughout the right chest is compatible with a large pleural effusion. Again noted is enlargement of the cardiac silhouette. Subtle interstitial densities throughout both lungs may represent mild pulmonary edema. Again noted is a nodular density  in the left lower chest most likely related to a nipple shadow. Degenerative changes near the thoracolumbar spine and cannot exclude a compression fracture in this area. IMPRESSION: Re-accumulation of right pleural fluid. There is a large right pleural effusion. Cardiomegaly and cannot exclude mild interstitial pulmonary edema. Electronically Signed   By: Richarda Overlie M.D.   On: 12/24/2015 17:43   Ct Chest W Contrast  12/26/2015  CLINICAL DATA:  Recurrent pleural effusions. Thoracentesis yesterday. EXAM: CT CHEST WITH CONTRAST TECHNIQUE: Multidetector CT imaging of the chest was performed during intravenous contrast administration. CONTRAST:  75mL OMNIPAQUE IOHEXOL 300 MG/ML  SOLN COMPARISON:  CT 07/31/2009 as well as chest x-ray 12/24/2015 and 12/25/2015. FINDINGS: There is mild motion artifact. Lungs are adequately inflated demonstrate a moderate size simple right pleural effusion post right thoracentesis. No evidence of pneumothorax. No left pleural effusion. Subtle hazy bilateral central airspace attenuation right greater than left likely interstitial edema, although cannot exclude infection. Airways are within normal. There is moderate cardiomegaly slightly worse. There is a calcified subcarinal lymph node. No significant mediastinal, hilar or axillary adenopathy. Mediastinal vascular structures are within normal. Mild prominence and heterogeneity of the thyroid gland with several more discrete nodules with the largest over the right mid to lower pole measuring 1.7 cm. Possible exophytic nodule over the anterior isthmus left of midline. No evidence of adenopathy. Images through the upper abdomen demonstrate 2 small adjacent sub cm liver hypodensities over the anterior segment right lobe as well as a 1 cm hypodensity over the right lobe likely cysts. There are degenerative changes of the spine. There is mild compression of 2 adjacent vertebral bodies in the region of the thoracolumbar junction unchanged.  IMPRESSION: Moderate size simple right pleural effusion post thoracentesis. No pneumothorax. Moderate cardiomegaly with hazy bilateral central airspace attenuation right greater than left likely mild interstitial edema and less likely infection. Mild prominence and heterogeneity of the thyroid gland with several discrete nodules. Recommend thyroid ultrasound as clinically indicated. Three small liver hypodensities likely cysts. Mild compression of 2 adjacent vertebral bodies at the thoracolumbar junction unchanged. Electronically Signed   By: Elberta Fortis M.D.   On: 12/26/2015 13:42   US Venous Img Lower Unilateral Right  12/24/2015  CLINICAL DATA:  Right lower extremity pain and swelling for the past 2 weeks. History of pulmonary embolism. Evaluate for DVT EXAM: RIGHT LOWER EXTREMITY VENOUS DOPPLER ULTRASOUND TECHNIQUE: Gray-scale sonography with graded compression, as well as color Doppler and duplex ultrasound were performed to evaluate the lower extremity deep venous systems from the level of the common femoral vein and including the common femoral, femoral, profunda femoral, popliteal and calf veins including the posterior tibial, peroneal and gastrocnemius veins when visible. The superficial great saphenous vein was also interrogated. Spectral Doppler was utilized to evaluate flow at rest and with distal augmentation maneuvers in the common femoral, femoral and popliteal  veins. COMPARISON:  None. FINDINGS: Contralateral Common Femoral Vein: Respiratory phasicity is normal and symmetric with the symptomatic side. No evidence of thrombus. Normal compressibility. Common Femoral Vein: No evidence of thrombus. Normal compressibility, respiratory phasicity and response to augmentation. Saphenofemoral Junction: No evidence of thrombus. Normal compressibility and flow on color Doppler imaging. Profunda Femoral Vein: No evidence of thrombus. Normal compressibility and flow on color Doppler imaging. Femoral Vein: No  evidence of thrombus. Normal compressibility, respiratory phasicity and response to augmentation. Popliteal Vein: No evidence of thrombus. Normal compressibility, respiratory phasicity and response to augmentation. Calf Veins: No evidence of thrombus. Normal compressibility and flow on color Doppler imaging. Superficial Great Saphenous Vein: No evidence of thrombus. Normal compressibility and flow on color Doppler imaging. Venous Reflux:  None. Other Findings:  None. IMPRESSION: No evidence of right lower extremity DVT. Electronically Signed   By: Simonne Come M.D.   On: 12/24/2015 17:49   Dg Chest Portable 1 View  12/07/2015  CLINICAL DATA:  Short of breath EXAM: PORTABLE CHEST 1 VIEW COMPARISON:  04/17/2014 FINDINGS: Large right pleural effusion. Cardiomegaly. Vascular congestion is stable. No pneumothorax. IMPRESSION: New large right pleural effusion. Stable cardiomegaly and vascular congestion Electronically Signed   By: Jolaine Click M.D.   On: 12/07/2015 08:53   US Thoracentesis Asp Pleural Space W/img Guide  12/25/2015  INDICATION: Recurrent RIGHT pleural effusion EXAM: ULTRASOUND GUIDED DIAGNOSTIC AND THERAPEUTIC RIGHT THORACENTESIS PROCEDURE: Procedure, benefits, and risks of procedure were discussed with patient. Written informed consent for procedure was obtained. Time out protocol followed. Pleural effusion localized by ultrasound at the posterior RIGHT hemithorax. Skin prepped and draped in usual sterile fashion. Skin and soft tissues anesthetized with 9 mL of 1% lidocaine. 8 French thoracentesis catheter placed into the RIGHT pleural space. 1500 mL of amber color fluid was aspirated by syringe pump. Procedure tolerated well by patient without immediate complication. FINDINGS: A total of approximately 1500 mL of RIGHT pleural fluid was removed. A fluid sample of 180 mL was sent for laboratory analysis. IMPRESSION: Successful ultrasound guided RIGHT thoracentesis yielding 1500 mL of pleural fluid.  Electronically Signed   By: Ulyses Southward M.D.   On: 12/25/2015 14:18   US Thoracentesis Asp Pleural Space W/img Guide  12/09/2015  CLINICAL DATA:  RIGHT pleural effusion, Down syndrome, hypertension, CHF, atrial fibrillation EXAM: ULTRASOUND GUIDED DIAGNOSTIC AND THERAPEUTIC THORACENTESIS COMPARISON:  Chest radiograph 12/07/2015 PROCEDURE: Procedure, benefits, and risks of procedure were discussed with patient. Written informed consent for procedure was obtained from patient's family. Time out protocol followed. Pleural effusion localized by ultrasound at the posterior RIGHT hemithorax. Skin prepped and draped in usual sterile fashion. Skin and soft tissues anesthetized with 9 mL of 1% lidocaine. 8 French thoracentesis catheter placed into the RIGHT pleural space. 1500 mL of yellow colored RIGHT pleural fluid aspirated by syringe pump. Procedure tolerated well by patient without immediate complication. COMPLICATIONS: None FINDINGS: As above. Fluid did not appear loculated. IMPRESSION: Successful ultrasound guided RIGHT thoracentesis yielding 1500 mL of pleural fluid. Electronically Signed   By: Ulyses Southward M.D.   On: 12/09/2015 14:03    Microbiology: Recent Results (from the past 240 hour(s))  Culture, body fluid-bottle     Status: None (Preliminary result)   Collection Time: 12/25/15  1:30 PM  Result Value Ref Range Status   Specimen Description FLUID RIGHT PLEURAL COLLECTED BY DOCTOR BOLES  Final   Special Requests BOTTLES DRAWN AEROBIC AND ANAEROBIC 10CC EACH  Final   Culture NO GROWTH 2  DAYS  Final   Report Status PENDING  Incomplete  Gram stain     Status: None   Collection Time: 12/25/15  1:30 PM  Result Value Ref Range Status   Specimen Description FLUID RIGHT PLEURAL COLLECTED BY DOCTOR BOLES  Final   Special Requests NONE  Final   Gram Stain   Final    CYTOSPIN SMEAR WBC PRESENT, PREDOMINANTLY MONONUCLEAR NO ORGANISMS SEEN Performed at [facility]    Report Status 12/25/2015  FINAL  Final     Labs: Basic Metabolic Panel:  Recent Labs Lab 12/24/15 1700 12/25/15 0544 12/26/15 0530 12/27/15 0551  NA 134* 134* 135 137  K 4.3 3.9 4.1 4.1  CL 97* 102 103 103  CO2 27 24 23 25   GLUCOSE 119* 117* 116* 112*  BUN 17 16 16 18   CREATININE 0.78 0.48 0.53 0.53  CALCIUM 9.6 8.6* 8.7* 8.7*  MG  --  1.9  --   --    Liver Function Tests:  Recent Labs Lab 12/24/15 1700  AST 33  ALT 26  ALKPHOS 143*  BILITOT 1.1  PROT 7.5  ALBUMIN 4.2   No results for input(s): LIPASE, AMYLASE in the last 168 hours. No results for input(s): AMMONIA in the last 168 hours. CBC:  Recent Labs Lab 12/24/15 1700 12/25/15 0544 12/27/15 0551  WBC 7.5 6.8 7.4  NEUTROABS 5.7 5.1  --   HGB 15.9* 14.0 15.2*  HCT 47.2* 43.3 46.5*  MCV 87.7 88.2 87.6  PLT 156 180 183   Cardiac Enzymes:  Recent Labs Lab 12/24/15 1700  TROPONINI 0.03   BNP: BNP (last 3 results)  Recent Labs  12/07/15 0752 12/24/15 1700  BNP 760.0* 563.0*    ProBNP (last 3 results) No results for input(s): PROBNP in the last 8760 hours.  CBG:  Recent Labs Lab 12/26/15 0748 12/26/15 1128 12/26/15 1649 12/26/15 2205 12/27/15 0708  GLUCAP 107* 91 100* 138* 104*       Signed:  Ryan Ogborn MD.  [facility] 12/27/2015, 10:46 AM

## 2015-12-27 NOTE — Progress Notes (Signed)
Patient for d/c today to ALF  bed at [facility] as pta. Brother and patient agreeable to this plan- plan transfer via ALF staff.Reece Levy, MSW, Theresia Majors [phone]

## 2015-12-27 NOTE — Care Management Note (Signed)
Case Management Note  Patient Details  Name: Angel Torres MRN: [id number] Date of Birth: [birth year]  Expected Discharge Date:     12/27/2015             Expected Discharge Plan:  Assisted Living / Rest Home  In-House Referral:  Clinical Social Work  Discharge planning Services  CM Consult  Post Acute Care Choice:  Home Health Choice offered to:   (facility preference, pt agrees)  DME Arranged:    DME Agency:     HH Arranged:  RN, PT HH Agency:  [facility]  Status of Service:  Completed, signed off  Medicare Important Message Given:  Yes Date Medicare IM Given:    Medicare IM give by:    Date Additional Medicare IM Given:    Additional Medicare Important Message give by:     If discussed at Long Length of Stay Meetings, dates discussed:    Additional Comments: Pt DC to ALF today. Pt will have HH nursing and PT services through [facility]. Alroy Bailiff, of [facility], made aware of referral and will obtain pt info from chart. Pt made aware HH has 48 hours to make first visit. CSW has arranged for return to facility. No further CM needs.   Malcolm Metro, RN 12/27/2015, 11:19 AM

## 2015-12-27 NOTE — Progress Notes (Signed)
ANTICOAGULATION CONSULT NOTE   Pharmacy Consult for Warfarin Indication: atrial fibrillation  No Known Allergies  Patient Measurements: Height:  (165.1 cm) Weight: 143 lb 8.3 oz (65.1 kg) IBW/kg (Calculated) : 57   Vital Signs: Temp: 98.1 F (36.7 C) (02/03 0554) Temp Source: Oral (02/03 0554) BP: 131/73 mmHg (02/03 0554) Pulse Rate: 76 (02/03 0554)  Labs:  Recent Labs  12/24/15 1700 12/25/15 0544 12/26/15 0530 12/27/15 0551  HGB 15.9* 14.0  --  15.2*  HCT 47.2* 43.3  --  46.5*  PLT 156 180  --  183  LABPROT 29.4* 26.3* 26.3* 24.0*  INR 2.84* 2.45* 2.45* 2.17*  CREATININE 0.78 0.48 0.53 0.53  TROPONINI 0.03  --   --   --     Estimated Creatinine Clearance: 58.9 mL/min (by C-G formula based on Cr of 0.53).   Medical History: Past Medical History  Diagnosis Date  . Down syndrome   . Hypertension   . CHF (congestive heart failure) (HCC)   . Atrial fibrillation (HCC)   . Pneumonia   . Pulmonary embolism (HCC)   . Leg fracture   . Systolic CHF (HCC)   . Shortness of breath   . Pulmonary hypertension (HCC) 12/25/2015  . 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 alendronate (FOSAMAX) 70 MG tablet Take 70 mg by mouth once a week. Takes on Mondays. Take with a full glass of water on an empty stomach.   12/23/2015 at Unknown time  . betamethasone dipropionate (DIPROLENE) 0.05 % cream Apply 1 application topically 2 (two) times daily as needed (for itching).   unknown  . Calcium Carbonate (CALCIUM 600 PO) Take 600 mg by mouth daily.   12/24/2015 at 800a  . digoxin (LANOXIN) 0.25 MG tablet Take 250 mcg by mouth daily.   12/24/2015 at 800a  . diltiazem (CARDIZEM) 120 MG tablet Take 120 mg by mouth 3 (three) times daily.   12/24/2015 at 1400  . furosemide (LASIX) 40 MG tablet Take 40 mg by mouth daily.    12/24/2015 at 800a  . glipiZIDE (GLUCOTROL) 5 MG tablet Take 5 mg by  mouth 2 (two) times daily before a meal.   12/23/2015 at 1700  . lisinopril (PRINIVIL,ZESTRIL) 5 MG tablet Take 5 mg by mouth daily.   12/24/2015 at 800a  . metFORMIN (GLUCOPHAGE-XR) 500 MG 24 hr tablet Take 500 mg by mouth daily.    12/24/2015 at 800a  . metoprolol tartrate (LOPRESSOR) 25 MG tablet Take 25 mg by mouth 2 (two) times daily.   12/24/2015 at 800a  . triamcinolone cream (KENALOG) 0.1 % Apply 1 application topically 2 (two) times daily.   12/23/2015 at 2000  . Vitamin D, Cholecalciferol, 400 UNITS TABS Take 400 Units by mouth daily.   12/24/2015 at 800  . warfarin (COUMADIN) 2 MG tablet Take 1-2 tablets by mouth See admin instructions. Take 1 tablet by mouth on Tuesday, Thursday and Saturday.  Take (1/2) tablet by mouth on Sunday, Monday, Wednesday, and Friday.   12/15/2015 at Unknown time  . warfarin (COUMADIN) 6 MG tablet Take 6 mg by mouth every evening.   12/23/2015 at 1700    Assessment: 70 yo lady on coumadin for afib.  Hg and PTLC stable, no bleeding noted.   Goal of Therapy:  INR 2-3   Plan:  Warfarin  PO x 1 today Daily PT/INR Monitor for signs and symptoms of  bleeding.   Thanks for allowing pharmacy to be a part of this patient's care.  Talbert Cage, PharmD Clinical Pharmacist 12/27/2015,8:01 AM

## 2015-12-27 NOTE — Progress Notes (Signed)
Pt's IV catheter removed and intact. Pt's IV site clean dry and intact. Pt in stable condition and in no acute distress at this time. Report given to Tresa Moore [facility]). All questions were answered and no further questions at this time. Pt escorted by staff at [facility].

## 2015-12-27 NOTE — Care Management Important Message (Signed)
Important Message  Patient Details  Name: Angel Torres MRN: [id number] Date of Birth: 08/23/1946   Medicare Important Message Given:  Yes    Malcolm Metro, RN 12/27/2015, 11:18 AM

## 2015-12-28 DIAGNOSIS — I272 Other secondary pulmonary hypertension: Secondary | ICD-10-CM | POA: Diagnosis not present

## 2015-12-28 DIAGNOSIS — Z7901 Long term (current) use of anticoagulants: Secondary | ICD-10-CM | POA: Diagnosis not present

## 2015-12-28 DIAGNOSIS — I10 Essential (primary) hypertension: Secondary | ICD-10-CM | POA: Diagnosis not present

## 2015-12-28 DIAGNOSIS — E119 Type 2 diabetes mellitus without complications: Secondary | ICD-10-CM | POA: Diagnosis not present

## 2015-12-28 DIAGNOSIS — L03116 Cellulitis of left lower limb: Secondary | ICD-10-CM | POA: Diagnosis not present

## 2015-12-28 DIAGNOSIS — I509 Heart failure, unspecified: Secondary | ICD-10-CM | POA: Diagnosis not present

## 2015-12-28 DIAGNOSIS — I482 Chronic atrial fibrillation: Secondary | ICD-10-CM | POA: Diagnosis not present

## 2015-12-28 DIAGNOSIS — J90 Pleural effusion, not elsewhere classified: Secondary | ICD-10-CM | POA: Diagnosis not present

## 2015-12-28 DIAGNOSIS — Q909 Down syndrome, unspecified: Secondary | ICD-10-CM | POA: Diagnosis not present

## 2015-12-28 DIAGNOSIS — L03115 Cellulitis of right lower limb: Secondary | ICD-10-CM | POA: Diagnosis not present

## 2015-12-28 DIAGNOSIS — Z7984 Long term (current) use of oral hypoglycemic drugs: Secondary | ICD-10-CM | POA: Diagnosis not present

## 2015-12-28 DIAGNOSIS — I5043 Acute on chronic combined systolic (congestive) and diastolic (congestive) heart failure: Secondary | ICD-10-CM | POA: Diagnosis not present

## 2015-12-30 DIAGNOSIS — J90 Pleural effusion, not elsewhere classified: Secondary | ICD-10-CM | POA: Diagnosis not present

## 2015-12-30 DIAGNOSIS — L03116 Cellulitis of left lower limb: Secondary | ICD-10-CM | POA: Diagnosis not present

## 2015-12-30 DIAGNOSIS — L03115 Cellulitis of right lower limb: Secondary | ICD-10-CM | POA: Diagnosis not present

## 2015-12-30 DIAGNOSIS — I509 Heart failure, unspecified: Secondary | ICD-10-CM | POA: Diagnosis not present

## 2015-12-30 DIAGNOSIS — I5043 Acute on chronic combined systolic (congestive) and diastolic (congestive) heart failure: Secondary | ICD-10-CM | POA: Diagnosis not present

## 2015-12-30 DIAGNOSIS — I482 Chronic atrial fibrillation: Secondary | ICD-10-CM | POA: Diagnosis not present

## 2015-12-30 LAB — CULTURE, BODY FLUID-BOTTLE: CULTURE: NO GROWTH

## 2015-12-31 DIAGNOSIS — L03115 Cellulitis of right lower limb: Secondary | ICD-10-CM | POA: Diagnosis not present

## 2015-12-31 DIAGNOSIS — Z1389 Encounter for screening for other disorder: Secondary | ICD-10-CM | POA: Diagnosis not present

## 2015-12-31 DIAGNOSIS — E119 Type 2 diabetes mellitus without complications: Secondary | ICD-10-CM | POA: Diagnosis not present

## 2015-12-31 DIAGNOSIS — I482 Chronic atrial fibrillation: Secondary | ICD-10-CM | POA: Diagnosis not present

## 2015-12-31 DIAGNOSIS — I5043 Acute on chronic combined systolic (congestive) and diastolic (congestive) heart failure: Secondary | ICD-10-CM | POA: Diagnosis not present

## 2015-12-31 DIAGNOSIS — I509 Heart failure, unspecified: Secondary | ICD-10-CM | POA: Diagnosis not present

## 2015-12-31 DIAGNOSIS — I10 Essential (primary) hypertension: Secondary | ICD-10-CM | POA: Diagnosis not present

## 2015-12-31 DIAGNOSIS — Z6825 Body mass index (BMI) 25.0-25.9, adult: Secondary | ICD-10-CM | POA: Diagnosis not present

## 2015-12-31 DIAGNOSIS — E782 Mixed hyperlipidemia: Secondary | ICD-10-CM | POA: Diagnosis not present

## 2015-12-31 DIAGNOSIS — L03116 Cellulitis of left lower limb: Secondary | ICD-10-CM | POA: Diagnosis not present

## 2015-12-31 DIAGNOSIS — J90 Pleural effusion, not elsewhere classified: Secondary | ICD-10-CM | POA: Diagnosis not present

## 2016-01-01 ENCOUNTER — Ambulatory Visit (INDEPENDENT_AMBULATORY_CARE_PROVIDER_SITE_OTHER): Payer: Medicare Other | Admitting: Cardiology

## 2016-01-01 ENCOUNTER — Encounter: Payer: Self-pay | Admitting: Cardiology

## 2016-01-01 VITALS — BP 130/78 | HR 55 | Ht 64.0 in | Wt 138.0 lb

## 2016-01-01 DIAGNOSIS — Z86711 Personal history of pulmonary embolism: Secondary | ICD-10-CM | POA: Diagnosis not present

## 2016-01-01 DIAGNOSIS — I5043 Acute on chronic combined systolic (congestive) and diastolic (congestive) heart failure: Secondary | ICD-10-CM

## 2016-01-01 DIAGNOSIS — I5189 Other ill-defined heart diseases: Secondary | ICD-10-CM

## 2016-01-01 DIAGNOSIS — I482 Chronic atrial fibrillation, unspecified: Secondary | ICD-10-CM

## 2016-01-01 DIAGNOSIS — I519 Heart disease, unspecified: Secondary | ICD-10-CM

## 2016-01-01 DIAGNOSIS — I10 Essential (primary) hypertension: Secondary | ICD-10-CM

## 2016-01-01 MED ORDER — FUROSEMIDE 40 MG PO TABS: ORAL_TABLET | ORAL | Status: DC

## 2016-01-01 MED ORDER — FUROSEMIDE 40 MG PO TABS: ORAL_TABLET | ORAL | Status: AC

## 2016-01-01 NOTE — Progress Notes (Signed)
Cardiology Office Note  Date: 01/01/2016   ID: Yuko, [place]entry [birth year], MRN [id number]  PCP: Kirk Ruths, MD  Referring provider: Dr. Elliot Cousin Consulting Cardiologist: Nona Dell, MD   Chief Complaint  Patient presents with  . Cardiomyopathy  . Post-hospital evaluation    History of Present Illness: Angel Torres is a 70 y.o. female referred for cardiology consultation by Dr. Elliot Cousin.  I reviewed her records and updated in the chart. She has been admitted to the [facility] recently in January with recurring right pleural effusions, status post thoracentesis on 2 occasions. Fluid was a transudate based on most recent assessment. TSH normal. She has been treated with diuretic therapy for management of combined heart failure. Her most recent echocardiogram is noted below.  She has a history of Down syndrome and resides [facility] where she has been for the last 10 years. She is here with her brother and an Geophysicist/field seismologist from the facility. Today we discussed the recent hospitalizations and the recurring right pleural effusion.  We discussed her echocardiogram which shows only mildly reduced LVEF and undetermined diastolic function. 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 The patient's brother indicates that his sister has had some intermittent abdominal bloating and leg edema which would be more in line with her RV dysfunction.  Nursing home weights indicate an increase from 142 pounds to 148 pounds over the last 3 days. Our scales indicated 138 pounds (?).  MAR reviewed her medicines are outlined below. These have been given consistently.  The patient herself is very hard of hearing, but indicates to me that she does not feel short of breath has had no chest pain.  She also has history of chronic atrial fibrillation on Coumadin. She is followed by [facility] and was just seen at that facility yesterday, no  adjustments made in medications.  Past Medical History  Diagnosis Date  . Down syndrome   . Essential hypertension   . Systolic dysfunction     LVEF 40%, indeterminate diastolic function  . Chronic atrial fibrillation (HCC)   . Pneumonia   . Pulmonary embolism (HCC)   . Leg fracture   . Pulmonary hypertension (HCC)     Moderate  . Right ventricular dysfunction     Past Surgical History  Procedure Laterality Date  . Thoracentesis Right 12/09/2015    Current Outpatient Prescriptions  Medication Sig Dispense Refill  . alendronate (FOSAMAX) 70 MG tablet Take 70 mg by mouth once a week. Takes on Mondays. Take with a full glass of water on an empty stomach.    . betamethasone dipropionate (DIPROLENE) 0.05 % cream Apply 1 application topically 2 (two) times daily as needed (for itching).    . Calcium Carbonate (CALCIUM 600 PO) Take 600 mg by mouth daily.    . cephALEXin (KEFLEX) 500 MG capsule Take 1 capsule (500 mg total) by mouth 2 (two) times daily. ANTIBIOTIC TO BE TAKEN FOR 3 MORE DAYS, STARTING ON 12/28/15. 6 capsule 0  . digoxin (LANOXIN) 0.25 MG tablet Take 250 mcg by mouth daily.    Marland Kitchen diltiazem (CARDIZEM) 60 MG tablet Take 1 tablet (60 mg total) by mouth 3 (three) times daily. 90 tablet 3  . furosemide (LASIX) 40 MG tablet Take 80 mg in the morning and 40 mg in the evening 90 tablet 3  . glipiZIDE (GLUCOTROL) 5 MG tablet Take 1 tablet (5 mg total) by mouth 2 (two) times daily before  a meal. HOLD IF BLOOD SUGAR IS LESS THAN 115.    . lisinopril (PRINIVIL,ZESTRIL) 5 MG tablet Take 5 mg by mouth daily.    . metFORMIN (GLUCOPHAGE-XR) 500 MG 24 hr tablet Take 1 tablet (500 mg total) by mouth daily. DO NOT START UNTIL 12/28/2015.    . metoprolol tartrate (LOPRESSOR) 25 MG tablet Take 25 mg by mouth 2 (two) times daily.    Marland Kitchen triamcinolone cream (KENALOG) 0.1 % Apply 1 application topically 2 (two) times daily.    . Vitamin D, Cholecalciferol, 400 UNITS TABS Take 400 Units by mouth daily.      Marland Kitchen warfarin (COUMADIN) 2 MG tablet Take 1-2 tablets by mouth See admin instructions. Take 1 tablet by mouth on Tuesday, Thursday and Saturday.  Take (1/2) tablet by mouth on Sunday, Monday, Wednesday, and Friday.    . warfarin (COUMADIN) 6 MG tablet Take 6 mg by mouth every evening.     No current facility-administered medications for this visit.   Allergies:  Review of patient's allergies indicates no known allergies.   Social History: The patient  reports that she has never smoked. She has never used smokeless tobacco. She reports that she does not drink alcohol or use illicit drugs.   Family History: The patient's family history includes Arrhythmia in her brother and father.   ROS:  Please see the history of present illness. Otherwise, complete review of systems is positive for significantly diminished hearing.  All other systems are reviewed and negative.   Physical Exam: VS:  BP 130/78 mmHg  Pulse 55  Ht  (1.626 m)  Wt 138 lb (62.596 kg)  BMI 23.68 kg/m2  SpO2 98%, BMI Body mass index is 23.68 kg/(m^2).  Wt Readings from Last 3 Encounters:  01/01/16 138 lb (62.596 kg)  12/27/15 143 lb 8.3 oz (65.1 kg)  12/10/15 147 lb 0.8 oz (66.7 kg)    General: Patient appears comfortable at rest. Uses a walker. HEENT:  Facies consistent with Down syndrome, oropharynx clear. Neck: Supple, mildly elevated JVP, no carotid bruits, no thyromegaly. Lungs:  Diminished breath sounds at the bases, right greater than left, nonlabored breathing at rest. Cardiac: Irregularly irregular, no S3, soft systolic murmur, no pericardial rub. Abdomen: Ptotuberant, nontender, bowel sounds present, no guarding or rebound. Extremities:  Mild leg edema, distal pulses 2+. Skin: Warm and dry. Musculoskeletal: No kyphosis. Neuropsychiatric: Alert and oriented x3, calm, hard of hearing.  ECG:  I personally reviewed her tracing from 12/24/2015 which revealed rate-controlled atrial fibrillation with PVCs, right  bundle branch block and rightward axis, nonspecific ST changes.  Recent Labwork: 12/24/2015: ALT 26; AST 33; B Natriuretic Peptide 563.0* 12/25/2015: Magnesium 1.9 12/26/2015: TSH 0.646 12/27/2015: BUN 18; Creatinine, Ser 0.53; Hemoglobin 15.2*; Platelets 183; Potassium 4.1; Sodium 137   Other Studies Reviewed Today:  Chest CT 12/26/2015: IMPRESSION: Moderate size simple right pleural effusion post thoracentesis. No pneumothorax.  Moderate cardiomegaly with hazy bilateral central airspace attenuation right greater than left likely mild interstitial edema and less likely infection.  Mild prominence and heterogeneity of the thyroid gland with several discrete nodules. Recommend thyroid ultrasound as clinically indicated.  Three small liver hypodensities likely cysts.  Mild compression of 2 adjacent vertebral bodies at the thoracolumbar junction unchanged.  Echocardiogram 12/08/2015: Study Conclusions  - Left ventricle: The cavity size was normal. There was mild concentric hypertrophy. The estimated ejection fraction was 48%. Diffuse hypokinesis. The study is not technically sufficient to allow evaluation of LV diastolic function. Ejection fraction (  MOD, 2-plane): 48%. - Ventricular septum: Septal motion showed paradox. The contour showed diastolic flattening. - Mitral valve: Mildly thickened leaflets . There was mild regurgitation. - Left atrium: The atrium was at the upper limits of normal in size. - Right ventricle: The cavity size was moderately dilated. Systolic function is reduced. - Right atrium: The atrium was mildly dilated. - Tricuspid valve: There was moderate regurgitation. - Pulmonary arteries: PA peak pressure: 52 mm Hg (S). - Inferior vena cava: The vessel was dilated. The respirophasic diameter changes were blunted (< 50%), consistent with elevated central venous pressure.  Impressions:  - Compared to a prior echo in 2010, LVEF is higher  at 48%. There is more significant right heart enlargement, RV hypokinesis, moderate pulmoanry hypertension with signs of volume and pressure overload, RVSP of 52 mmHg, moderate TR and dilated IVC.  Assessment and Plan:  1. Recurring volume overload and right pleural effusion status post thoracentesis 2, transudative. This must be more than simply related to mild systolic dysfunction with LVEF 48% recently, and although not clearly specified on her echocardiogram, she likely does have diastolic dysfunction particularly with chronic atrial fibrillation. She also has RV dysfunction with pulmonary hypertension which would certainly better explain her increased abdominal girth and leg edema. Her weight is already increasing in the last few days, we will increase Lasix to 80 mg in the morning and 40 mg in the afternoon with a follow-up BMET and office visit in the next few weeks. Goal will be trying to obtain optimal diuretic dose with stable renal function. I do not think that a Pleurx catheter is the right answer at this time.  2. Chronic atrial fibrillation, she is on Coumadin which is followed by [place]. Heart rate is adequately controlled on combination of Lopressor and Cardizem.  3. Essential hypertension, blood pressure control is adequate today.  4. History of pulmonary embolus, details not clear. This could be associated with her pulmonary hypertension and RV dysfunction however. She is on chronic Coumadin with associated atrial fibrillation.  Current medicines were reviewed with the patient today.   Orders Placed This Encounter  Procedures  . Basic metabolic panel    Disposition: FU in the [place] office in 2 weeks.   Signed, Angel Sidle, MD, [facility] 01/01/2016 4:31 PM    [facility] at [facility] [address] [place], [place], [place] [address] Phone: [phone]; Fax: [phone]

## 2016-01-01 NOTE — Patient Instructions (Addendum)
Your physician has recommended you make the following change in your medication:  Increase furosemide to 80 mg in the morning and 40 mg in the evening. Continue all other medications the same. Your physician recommends that you return for lab work in: one week to check your BMET. Your physician recommends that you weigh, daily, at the same time every day, and in the same amount of clothing. Please record your daily weights on the handout provided and bring it to your next appointment. Your physician recommends that you schedule a follow-up appointment in: 2 weeks at the [facility] office with a nurse practitioner.

## 2016-01-02 DIAGNOSIS — I482 Chronic atrial fibrillation: Secondary | ICD-10-CM | POA: Diagnosis not present

## 2016-01-02 DIAGNOSIS — I509 Heart failure, unspecified: Secondary | ICD-10-CM | POA: Diagnosis not present

## 2016-01-02 DIAGNOSIS — L03116 Cellulitis of left lower limb: Secondary | ICD-10-CM | POA: Diagnosis not present

## 2016-01-02 DIAGNOSIS — J90 Pleural effusion, not elsewhere classified: Secondary | ICD-10-CM | POA: Diagnosis not present

## 2016-01-02 DIAGNOSIS — I5043 Acute on chronic combined systolic (congestive) and diastolic (congestive) heart failure: Secondary | ICD-10-CM | POA: Diagnosis not present

## 2016-01-02 DIAGNOSIS — Z86711 Personal history of pulmonary embolism: Secondary | ICD-10-CM | POA: Diagnosis not present

## 2016-01-02 DIAGNOSIS — L03115 Cellulitis of right lower limb: Secondary | ICD-10-CM | POA: Diagnosis not present

## 2016-01-06 DIAGNOSIS — I5043 Acute on chronic combined systolic (congestive) and diastolic (congestive) heart failure: Secondary | ICD-10-CM | POA: Diagnosis not present

## 2016-01-06 DIAGNOSIS — L03116 Cellulitis of left lower limb: Secondary | ICD-10-CM | POA: Diagnosis not present

## 2016-01-06 DIAGNOSIS — I509 Heart failure, unspecified: Secondary | ICD-10-CM | POA: Diagnosis not present

## 2016-01-06 DIAGNOSIS — L03115 Cellulitis of right lower limb: Secondary | ICD-10-CM | POA: Diagnosis not present

## 2016-01-06 DIAGNOSIS — I482 Chronic atrial fibrillation: Secondary | ICD-10-CM | POA: Diagnosis not present

## 2016-01-06 DIAGNOSIS — J90 Pleural effusion, not elsewhere classified: Secondary | ICD-10-CM | POA: Diagnosis not present

## 2016-01-08 ENCOUNTER — Other Ambulatory Visit: Payer: Self-pay | Admitting: Cardiology

## 2016-01-08 DIAGNOSIS — I5043 Acute on chronic combined systolic (congestive) and diastolic (congestive) heart failure: Secondary | ICD-10-CM | POA: Diagnosis not present

## 2016-01-08 DIAGNOSIS — L03115 Cellulitis of right lower limb: Secondary | ICD-10-CM | POA: Diagnosis not present

## 2016-01-08 DIAGNOSIS — J90 Pleural effusion, not elsewhere classified: Secondary | ICD-10-CM | POA: Diagnosis not present

## 2016-01-08 DIAGNOSIS — I5032 Chronic diastolic (congestive) heart failure: Secondary | ICD-10-CM | POA: Diagnosis not present

## 2016-01-08 DIAGNOSIS — L03116 Cellulitis of left lower limb: Secondary | ICD-10-CM | POA: Diagnosis not present

## 2016-01-08 DIAGNOSIS — I509 Heart failure, unspecified: Secondary | ICD-10-CM | POA: Diagnosis not present

## 2016-01-08 DIAGNOSIS — I482 Chronic atrial fibrillation: Secondary | ICD-10-CM | POA: Diagnosis not present

## 2016-01-08 DIAGNOSIS — I10 Essential (primary) hypertension: Secondary | ICD-10-CM | POA: Diagnosis not present

## 2016-01-09 LAB — BASIC METABOLIC PANEL
BUN: 13 mg/dL (ref 7–25)
CHLORIDE: 97 mmol/L — AB (ref 98–110)
CO2: 27 mmol/L (ref 20–31)
CREATININE: 0.62 mg/dL (ref 0.60–0.93)
Calcium: 9.8 mg/dL (ref 8.6–10.4)
Glucose, Bld: 119 mg/dL — ABNORMAL HIGH (ref 65–99)
POTASSIUM: 4.1 mmol/L (ref 3.5–5.3)
Sodium: 136 mmol/L (ref 135–146)

## 2016-01-10 ENCOUNTER — Encounter: Payer: Self-pay | Admitting: *Deleted

## 2016-01-10 ENCOUNTER — Telehealth: Payer: Self-pay | Admitting: *Deleted

## 2016-01-10 DIAGNOSIS — I5043 Acute on chronic combined systolic (congestive) and diastolic (congestive) heart failure: Secondary | ICD-10-CM | POA: Diagnosis not present

## 2016-01-10 DIAGNOSIS — L03115 Cellulitis of right lower limb: Secondary | ICD-10-CM | POA: Diagnosis not present

## 2016-01-10 DIAGNOSIS — L03116 Cellulitis of left lower limb: Secondary | ICD-10-CM | POA: Diagnosis not present

## 2016-01-10 DIAGNOSIS — J90 Pleural effusion, not elsewhere classified: Secondary | ICD-10-CM | POA: Diagnosis not present

## 2016-01-10 DIAGNOSIS — I509 Heart failure, unspecified: Secondary | ICD-10-CM | POA: Diagnosis not present

## 2016-01-10 DIAGNOSIS — I482 Chronic atrial fibrillation: Secondary | ICD-10-CM | POA: Diagnosis not present

## 2016-01-10 NOTE — Telephone Encounter (Signed)
Results mailed to patient;.

## 2016-01-10 NOTE — Telephone Encounter (Signed)
-----   Message from Jonelle Sidle, MD sent at 01/09/2016  7:28 AM EST ----- Reviewed. Potassium and creatinine remained normal. Continue current regimen.

## 2016-01-13 DIAGNOSIS — J90 Pleural effusion, not elsewhere classified: Secondary | ICD-10-CM | POA: Diagnosis not present

## 2016-01-13 DIAGNOSIS — L03115 Cellulitis of right lower limb: Secondary | ICD-10-CM | POA: Diagnosis not present

## 2016-01-13 DIAGNOSIS — I482 Chronic atrial fibrillation: Secondary | ICD-10-CM | POA: Diagnosis not present

## 2016-01-13 DIAGNOSIS — I5043 Acute on chronic combined systolic (congestive) and diastolic (congestive) heart failure: Secondary | ICD-10-CM | POA: Diagnosis not present

## 2016-01-13 DIAGNOSIS — L03116 Cellulitis of left lower limb: Secondary | ICD-10-CM | POA: Diagnosis not present

## 2016-01-13 DIAGNOSIS — I509 Heart failure, unspecified: Secondary | ICD-10-CM | POA: Diagnosis not present

## 2016-01-14 ENCOUNTER — Telehealth: Payer: Self-pay | Admitting: Cardiology

## 2016-01-14 DIAGNOSIS — E042 Nontoxic multinodular goiter: Secondary | ICD-10-CM | POA: Diagnosis not present

## 2016-01-14 NOTE — Telephone Encounter (Signed)
Angel Torres with [facility] - gained 5 pounds since yesterday.  Wearing compression hose. No SOB, chest pain, or dizziness.  Stated she was having some type of ultrasound today ordered when she got out of hospital.  Did not know what type specifically, only that she had to go to [facility], [place].  Patient already has a follow up appointment scheduled for tomorrow with Herma Carson, PA in [place] office.

## 2016-01-14 NOTE — Telephone Encounter (Signed)
We will see what her evaluation shows tomorrow.    Dominga Ferry MD

## 2016-01-14 NOTE — Telephone Encounter (Signed)
Notified Gayle with [facility] of message below.  She is aware of OV tomorrow.

## 2016-01-14 NOTE — Telephone Encounter (Signed)
Patient has being weighed daily. Has had 5 lb weight gain in one day. Please call with instructions. / tg

## 2016-01-15 ENCOUNTER — Ambulatory Visit (INDEPENDENT_AMBULATORY_CARE_PROVIDER_SITE_OTHER): Payer: Medicare Other | Admitting: Physician Assistant

## 2016-01-15 ENCOUNTER — Encounter: Payer: Self-pay | Admitting: Physician Assistant

## 2016-01-15 VITALS — BP 124/72 | HR 72 | Ht 64.0 in | Wt 147.0 lb

## 2016-01-15 DIAGNOSIS — I5043 Acute on chronic combined systolic (congestive) and diastolic (congestive) heart failure: Secondary | ICD-10-CM | POA: Diagnosis not present

## 2016-01-15 DIAGNOSIS — Z79899 Other long term (current) drug therapy: Secondary | ICD-10-CM | POA: Diagnosis not present

## 2016-01-15 DIAGNOSIS — L03116 Cellulitis of left lower limb: Secondary | ICD-10-CM | POA: Diagnosis not present

## 2016-01-15 DIAGNOSIS — I482 Chronic atrial fibrillation, unspecified: Secondary | ICD-10-CM

## 2016-01-15 DIAGNOSIS — I5041 Acute combined systolic (congestive) and diastolic (congestive) heart failure: Secondary | ICD-10-CM | POA: Diagnosis not present

## 2016-01-15 DIAGNOSIS — I10 Essential (primary) hypertension: Secondary | ICD-10-CM | POA: Diagnosis not present

## 2016-01-15 DIAGNOSIS — L03115 Cellulitis of right lower limb: Secondary | ICD-10-CM | POA: Diagnosis not present

## 2016-01-15 DIAGNOSIS — J90 Pleural effusion, not elsewhere classified: Secondary | ICD-10-CM | POA: Diagnosis not present

## 2016-01-15 DIAGNOSIS — I509 Heart failure, unspecified: Secondary | ICD-10-CM | POA: Diagnosis not present

## 2016-01-15 NOTE — Patient Instructions (Signed)
Your physician recommends that you schedule a follow-up appointment in: 2 weeks with Dr. Diona Browner  Your physician recommends that you return for lab work in: 1 week (BMET)  Your physician has recommended you make the following change in your medication:   Increase Lasix to 80 mg Two Times Daily for the next 3 Days. Then return to 80 mg in the Morning and 40 mg in the Evening.   If you need a refill on your cardiac medications before your next appointment, please call your pharmacy.  Thank you for choosing [facility]!

## 2016-01-15 NOTE — Progress Notes (Signed)
Cardiology Office Note   Date:  01/15/2016   ID:  [name], [name] 05-Aug-1946, MRN [id number]  PCP:  [facility]  Cardiologist:  Dr. Diona Browner Chief Complaint: 5 pound weight gain    History of Present Illness: Angel Torres is a 70 y.o. female who presents for for two-week follow-up. She has a history of Down syndrome and resides at [facility] nursing center. She had 2 recent hospitalizations for recurrent right pleural effusion status post thoracentesis. 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 2-D echo showed only mild reduction in LVEF 48% and an undetermined diastolic function. She does have RV dysfunction and moderate pulmonary hypertension. She also has chronic atrial fibrillation on Coumadin, Cardizem and Lopressor followed at [facility]. She also has hypertension, history of pulmonary embolus, details unclear.  Patient saw Dr. Diona Browner on 01/01/16 at which time she had weight gain increased abdominal girth and leg edema. He increased her Lasix to 80 mg in the morning 40 in the afternoon with follow-up labs. Her weight was 138 in our office on 01/01/16 but 148 and the nursing home which was up 6 pounds in 3 days.  She has brought in today with her weight up to 154 pounds. On their scales 147 pounds on our scales. She denies any shortness of breath or cardiac complaints. Her aide tells me she is on a low-sodium diet and not getting any extra salt. Labs drawn on 01/08/16 creatinine was stable at 0.62 potassium 4.1    Past Medical History  Diagnosis Date  . Down syndrome   . Essential hypertension   . Systolic dysfunction     LVEF 40%, indeterminate diastolic function  . Chronic atrial fibrillation (HCC)   . Pneumonia   . Pulmonary embolism (HCC)   . Leg fracture   . Pulmonary hypertension (HCC)     Moderate  . Right ventricular dysfunction     Past Surgical History  Procedure  Laterality Date  . Thoracentesis Right 12/09/2015     Current Outpatient Prescriptions  Medication Sig Dispense Refill  . alendronate (FOSAMAX) 70 MG tablet Take 70 mg by mouth once a week. Takes on Mondays. Take with a full glass of water on an empty stomach.    . betamethasone dipropionate (DIPROLENE) 0.05 % cream Apply 1 application topically 2 (two) times daily as needed (for itching).    . Calcium Carbonate (CALCIUM 600 PO) Take 600 mg by mouth daily.    . cephALEXin (KEFLEX) 500 MG capsule Take 1 capsule (500 mg total) by mouth 2 (two) times daily. ANTIBIOTIC TO BE TAKEN FOR 3 MORE DAYS, STARTING ON 12/28/15. 6 capsule 0  . digoxin (LANOXIN) 0.25 MG tablet Take 250 mcg by mouth daily.    Marland Kitchen diltiazem (CARDIZEM) 60 MG tablet Take 1 tablet (60 mg total) by mouth 3 (three) times daily. 90 tablet 3  . furosemide (LASIX) 40 MG tablet Take 80 mg in the morning and 40 mg in the evening 90 tablet 3  . glipiZIDE (GLUCOTROL) 5 MG tablet Take 1 tablet (5 mg total) by mouth 2 (two) times daily before a meal. HOLD IF BLOOD SUGAR IS LESS THAN 115.    . lisinopril (PRINIVIL,ZESTRIL) 5 MG tablet Take 5 mg by mouth daily.    . metFORMIN (GLUCOPHAGE-XR) 500 MG 24 hr tablet Take 1 tablet (500 mg total) by mouth daily. DO NOT START UNTIL 12/28/2015.    Marland Kitchen  metoprolol tartrate (LOPRESSOR) 25 MG tablet Take 25 mg by mouth 2 (two) times daily.    Marland Kitchen triamcinolone cream (KENALOG) 0.1 % Apply 1 application topically 2 (two) times daily.    . Vitamin D, Cholecalciferol, 400 UNITS TABS Take 400 Units by mouth daily.    Marland Kitchen warfarin (COUMADIN) 2 MG tablet Take 1-2 tablets by mouth See admin instructions. Take 1 tablet by mouth on Tuesday, Thursday and Saturday.  Take (1/2) tablet by mouth on Sunday, Monday, Wednesday, and Friday.    . warfarin (COUMADIN) 6 MG tablet Take 6 mg by mouth every evening.     No current facility-administered medications for this visit.    Allergies:   Review of patient's allergies indicates no  known allergies.    Social History:  The patient  reports that she has never smoked. She has never used smokeless tobacco. She reports that she does not drink alcohol or use illicit drugs.   Family History:  The patient's    family history includes Arrhythmia in her brother and father.    ROS:  Please see the history of present illness.   Otherwise, review of systems are positive for none.   All other systems are reviewed and negative.    PHYSICAL EXAM: VS:  Ht  (1.626 m)  Wt 147 lb (66.679 kg)  BMI 25.22 kg/m2 , BMI Body mass index is 25.22 kg/(m^2). GEN: Well nourished, well developed, in no acute distress Neck: no JVD, HJR, carotid bruits, or masses Cardiac: Irregular irregular 1/6 systolic murmur at the left sternal border, no gallop, rubs, thrill or heave,  Respiratory: Decreased breath sounds at right base otherwise  clear to auscultation bilaterally, normal work of breathing GI: A little distended soft, nontender,  + BS MS: no deformity or atrophy Extremities: Patient has TED stockings on, +1 edema bilaterally without cyanosis, clubbing, good distal pulses bilaterally.  Skin: warm and dry, no rash Neuro:  Strength and sensation are intact    EKG:  EKG is not ordered today.    Recent Labs: 12/24/2015: ALT 26; B Natriuretic Peptide 563.0* 12/25/2015: Magnesium 1.9 12/26/2015: TSH 0.646 12/27/2015: Hemoglobin 15.2*; Platelets 183 01/08/2016: BUN 13; Creat 0.62; Potassium 4.1; Sodium 136    Lipid Panel    Component Value Date/Time   CHOL  07/31/2009 0415    77        ATP III CLASSIFICATION:  <200     mg/dL   Desirable  161-096  mg/dL   Borderline High  >=045    mg/dL   High          TRIG 61 07/31/2009 0415   HDL 26* 07/31/2009 0415   CHOLHDL 3.0 07/31/2009 0415   VLDL 12 07/31/2009 0415   LDLCALC  07/31/2009 0415    39        Total Cholesterol/HDL:CHD Risk Coronary Heart Disease Risk Table                     Men   Women  1/2 Average Risk   3.4   3.3   Average Risk       5.0   4.4  2 X Average Risk   9.6   7.1  3 X Average Risk  23.4   11.0        Use the calculated Patient Ratio above and the CHD Risk Table to determine the patient's CHD Risk.        ATP III CLASSIFICATION (LDL):  <100  mg/dL   Optimal  213-086  mg/dL   Near or Above                    Optimal  130-159  mg/dL   Borderline  578-469  mg/dL   High  >629     mg/dL   Very High      Wt Readings from Last 3 Encounters:  01/15/16 147 lb (66.679 kg)  01/01/16 138 lb (62.596 kg)  12/27/15 143 lb 8.3 oz (65.1 kg)      Other studies Reviewed: Additional studies/ records that were reviewed today include and review of the records demonstrates:   Echocardiogram 12/08/2015: Study Conclusions  - Left ventricle: The cavity size was normal. There was mild   concentric hypertrophy. The estimated ejection fraction was 48%.   Diffuse hypokinesis. The study is not technically sufficient to   allow evaluation of LV diastolic function. Ejection fraction   (MOD, 2-plane): 48%. - Ventricular septum: Septal motion showed paradox. The contour   showed diastolic flattening. - Mitral valve: Mildly thickened leaflets . There was mild   regurgitation. - Left atrium: The atrium was at the upper limits of normal in   size. - Right ventricle: The cavity size was moderately dilated. Systolic   function is reduced. - Right atrium: The atrium was mildly dilated. - Tricuspid valve: There was moderate regurgitation. - Pulmonary arteries: PA peak pressure: 52 mm Hg (S). - Inferior vena cava: The vessel was dilated. The respirophasic   diameter changes were blunted (< 50%), consistent with elevated   central venous pressure.  Impressions:  - Compared to a prior echo in 2010, LVEF is higher at 48%. There is   more significant right heart enlargement, RV hypokinesis,   moderate pulmoanry hypertension with signs of volume and pressure   overload, RVSP of 52 mmHg, moderate TR and  dilated IVC.   ASSESSMENT AND PLAN:  Acute combined systolic and diastolic heart failure (HCC) Patient has under gone 2 thoracentesis with transudate base and also has RV dysfunction and moderate pulmonary hypertension. EF 48% .Patient's weight has gone up again despite increase in Lasix. I reviewed her weights from the nursing home and they continued to climb. They say she is on a 2 g sodium diet. I will increase her Lasix to 80 mg twice a day for 3 days then back to 80 mg in the morning 40 in the evening. Recheck labs next week. She is not in any distress. Follow-up with Dr. Diona Browner in 2 weeks.  Chronic atrial fibrillation (HCC) Rate is controlled on metoprolol, diltiazem, and digoxin. Also on Coumadin managed by [facility].  Essential hypertension Blood pressures been well-controlled    Signed, Jacolyn Reedy, PA-C  01/15/2016 11:38 AM    [facility] [address] [place], [place], [place]  [address] Phone: [phone]; Fax: [phone]

## 2016-01-15 NOTE — Assessment & Plan Note (Signed)
Blood pressures been well-controlled

## 2016-01-15 NOTE — Assessment & Plan Note (Signed)
Rate is controlled on metoprolol, diltiazem, and digoxin. Also on Coumadin managed by [facility].

## 2016-01-15 NOTE — Assessment & Plan Note (Addendum)
Patient has under gone 2 thoracentesis with transudate base and also has RV dysfunction and moderate pulmonary hypertension. EF 48% .Patient's weight has gone up again despite increase in Lasix. I reviewed her weights from the nursing home and they continued to climb. They say she is on a 2 g sodium diet. I will increase her Lasix to 80 mg twice a day for 3 days then back to 80 mg in the morning 40 in the evening. Recheck labs next week. She is not in any distress. Follow-up with Dr. Diona Browner in 2 weeks.

## 2016-01-16 ENCOUNTER — Other Ambulatory Visit (HOSPITAL_COMMUNITY): Payer: Self-pay | Admitting: Family Medicine

## 2016-01-16 ENCOUNTER — Ambulatory Visit (HOSPITAL_COMMUNITY)
Admission: RE | Admit: 2016-01-16 | Discharge: 2016-01-16 | Disposition: A | Discharge status: Home / Self Care | Payer: Medicare Other | Source: Ambulatory Visit | Attending: Family Medicine | Admitting: Family Medicine

## 2016-01-16 DIAGNOSIS — Z1231 Encounter for screening mammogram for malignant neoplasm of breast: Secondary | ICD-10-CM

## 2016-01-16 DIAGNOSIS — I482 Chronic atrial fibrillation: Secondary | ICD-10-CM | POA: Diagnosis not present

## 2016-01-16 DIAGNOSIS — R921 Mammographic calcification found on diagnostic imaging of breast: Secondary | ICD-10-CM | POA: Insufficient documentation

## 2016-01-16 DIAGNOSIS — L03116 Cellulitis of left lower limb: Secondary | ICD-10-CM | POA: Diagnosis not present

## 2016-01-16 DIAGNOSIS — I509 Heart failure, unspecified: Secondary | ICD-10-CM | POA: Diagnosis not present

## 2016-01-16 DIAGNOSIS — J90 Pleural effusion, not elsewhere classified: Secondary | ICD-10-CM | POA: Diagnosis not present

## 2016-01-16 DIAGNOSIS — I5043 Acute on chronic combined systolic (congestive) and diastolic (congestive) heart failure: Secondary | ICD-10-CM | POA: Diagnosis not present

## 2016-01-16 DIAGNOSIS — L03115 Cellulitis of right lower limb: Secondary | ICD-10-CM | POA: Diagnosis not present

## 2016-01-20 ENCOUNTER — Other Ambulatory Visit: Payer: Self-pay | Admitting: Family Medicine

## 2016-01-20 DIAGNOSIS — L03116 Cellulitis of left lower limb: Secondary | ICD-10-CM | POA: Diagnosis not present

## 2016-01-20 DIAGNOSIS — I482 Chronic atrial fibrillation: Secondary | ICD-10-CM | POA: Diagnosis not present

## 2016-01-20 DIAGNOSIS — I5043 Acute on chronic combined systolic (congestive) and diastolic (congestive) heart failure: Secondary | ICD-10-CM | POA: Diagnosis not present

## 2016-01-20 DIAGNOSIS — I509 Heart failure, unspecified: Secondary | ICD-10-CM | POA: Diagnosis not present

## 2016-01-20 DIAGNOSIS — L03115 Cellulitis of right lower limb: Secondary | ICD-10-CM | POA: Diagnosis not present

## 2016-01-20 DIAGNOSIS — R928 Other abnormal and inconclusive findings on diagnostic imaging of breast: Secondary | ICD-10-CM

## 2016-01-20 DIAGNOSIS — J90 Pleural effusion, not elsewhere classified: Secondary | ICD-10-CM | POA: Diagnosis not present

## 2016-01-21 DIAGNOSIS — J90 Pleural effusion, not elsewhere classified: Secondary | ICD-10-CM | POA: Diagnosis not present

## 2016-01-21 DIAGNOSIS — I482 Chronic atrial fibrillation: Secondary | ICD-10-CM | POA: Diagnosis not present

## 2016-01-21 DIAGNOSIS — L03115 Cellulitis of right lower limb: Secondary | ICD-10-CM | POA: Diagnosis not present

## 2016-01-21 DIAGNOSIS — L03116 Cellulitis of left lower limb: Secondary | ICD-10-CM | POA: Diagnosis not present

## 2016-01-21 DIAGNOSIS — I5043 Acute on chronic combined systolic (congestive) and diastolic (congestive) heart failure: Secondary | ICD-10-CM | POA: Diagnosis not present

## 2016-01-21 DIAGNOSIS — I509 Heart failure, unspecified: Secondary | ICD-10-CM | POA: Diagnosis not present

## 2016-01-23 ENCOUNTER — Other Ambulatory Visit (HOSPITAL_COMMUNITY): Payer: Self-pay | Admitting: Family Medicine

## 2016-01-23 DIAGNOSIS — R928 Other abnormal and inconclusive findings on diagnostic imaging of breast: Secondary | ICD-10-CM

## 2016-01-24 DIAGNOSIS — Z1389 Encounter for screening for other disorder: Secondary | ICD-10-CM | POA: Diagnosis not present

## 2016-01-24 DIAGNOSIS — Z7901 Long term (current) use of anticoagulants: Secondary | ICD-10-CM | POA: Diagnosis not present

## 2016-01-24 DIAGNOSIS — E119 Type 2 diabetes mellitus without complications: Secondary | ICD-10-CM | POA: Diagnosis not present

## 2016-01-24 DIAGNOSIS — I482 Chronic atrial fibrillation: Secondary | ICD-10-CM | POA: Diagnosis not present

## 2016-01-24 DIAGNOSIS — I5043 Acute on chronic combined systolic (congestive) and diastolic (congestive) heart failure: Secondary | ICD-10-CM | POA: Diagnosis not present

## 2016-01-24 DIAGNOSIS — Z6825 Body mass index (BMI) 25.0-25.9, adult: Secondary | ICD-10-CM | POA: Diagnosis not present

## 2016-01-24 DIAGNOSIS — L03116 Cellulitis of left lower limb: Secondary | ICD-10-CM | POA: Diagnosis not present

## 2016-01-24 DIAGNOSIS — L03115 Cellulitis of right lower limb: Secondary | ICD-10-CM | POA: Diagnosis not present

## 2016-01-24 DIAGNOSIS — R32 Unspecified urinary incontinence: Secondary | ICD-10-CM | POA: Diagnosis not present

## 2016-01-24 DIAGNOSIS — J90 Pleural effusion, not elsewhere classified: Secondary | ICD-10-CM | POA: Diagnosis not present

## 2016-01-24 DIAGNOSIS — R269 Unspecified abnormalities of gait and mobility: Secondary | ICD-10-CM | POA: Diagnosis not present

## 2016-01-24 DIAGNOSIS — I509 Heart failure, unspecified: Secondary | ICD-10-CM | POA: Diagnosis not present

## 2016-01-24 DIAGNOSIS — Z79899 Other long term (current) drug therapy: Secondary | ICD-10-CM | POA: Diagnosis not present

## 2016-01-24 DIAGNOSIS — N342 Other urethritis: Secondary | ICD-10-CM | POA: Diagnosis not present

## 2016-01-25 ENCOUNTER — Emergency Department (HOSPITAL_COMMUNITY): Payer: Medicare Other

## 2016-01-25 ENCOUNTER — Encounter (HOSPITAL_COMMUNITY): Payer: Self-pay | Admitting: *Deleted

## 2016-01-25 ENCOUNTER — Observation Stay (HOSPITAL_COMMUNITY)
Admission: EM | Admit: 2016-01-25 | Discharge: 2016-01-30 | Disposition: A | Discharge status: Home / Self Care | Payer: Medicare Other | Attending: Internal Medicine | Admitting: Internal Medicine

## 2016-01-25 DIAGNOSIS — I5041 Acute combined systolic (congestive) and diastolic (congestive) heart failure: Secondary | ICD-10-CM | POA: Diagnosis not present

## 2016-01-25 DIAGNOSIS — I270 Primary pulmonary hypertension: Secondary | ICD-10-CM | POA: Diagnosis not present

## 2016-01-25 DIAGNOSIS — R112 Nausea with vomiting, unspecified: Secondary | ICD-10-CM | POA: Diagnosis not present

## 2016-01-25 DIAGNOSIS — T148XXA Other injury of unspecified body region, initial encounter: Secondary | ICD-10-CM

## 2016-01-25 DIAGNOSIS — R52 Pain, unspecified: Secondary | ICD-10-CM

## 2016-01-25 DIAGNOSIS — Z79899 Other long term (current) drug therapy: Secondary | ICD-10-CM | POA: Insufficient documentation

## 2016-01-25 DIAGNOSIS — R008 Other abnormalities of heart beat: Secondary | ICD-10-CM | POA: Diagnosis not present

## 2016-01-25 DIAGNOSIS — I671 Cerebral aneurysm, nonruptured: Secondary | ICD-10-CM

## 2016-01-25 DIAGNOSIS — I272 Pulmonary hypertension, unspecified: Secondary | ICD-10-CM | POA: Diagnosis present

## 2016-01-25 DIAGNOSIS — R42 Dizziness and giddiness: Secondary | ICD-10-CM | POA: Insufficient documentation

## 2016-01-25 DIAGNOSIS — I10 Essential (primary) hypertension: Secondary | ICD-10-CM | POA: Insufficient documentation

## 2016-01-25 DIAGNOSIS — I498 Other specified cardiac arrhythmias: Secondary | ICD-10-CM

## 2016-01-25 DIAGNOSIS — G459 Transient cerebral ischemic attack, unspecified: Secondary | ICD-10-CM | POA: Diagnosis present

## 2016-01-25 DIAGNOSIS — I499 Cardiac arrhythmia, unspecified: Secondary | ICD-10-CM

## 2016-01-25 DIAGNOSIS — I5082 Biventricular heart failure: Secondary | ICD-10-CM

## 2016-01-25 DIAGNOSIS — R001 Bradycardia, unspecified: Secondary | ICD-10-CM | POA: Diagnosis not present

## 2016-01-25 DIAGNOSIS — I509 Heart failure, unspecified: Secondary | ICD-10-CM | POA: Diagnosis not present

## 2016-01-25 DIAGNOSIS — Z7984 Long term (current) use of oral hypoglycemic drugs: Secondary | ICD-10-CM | POA: Insufficient documentation

## 2016-01-25 DIAGNOSIS — Z7901 Long term (current) use of anticoagulants: Secondary | ICD-10-CM

## 2016-01-25 DIAGNOSIS — R1111 Vomiting without nausea: Secondary | ICD-10-CM | POA: Diagnosis not present

## 2016-01-25 DIAGNOSIS — I482 Chronic atrial fibrillation, unspecified: Secondary | ICD-10-CM | POA: Diagnosis present

## 2016-01-25 DIAGNOSIS — E119 Type 2 diabetes mellitus without complications: Secondary | ICD-10-CM

## 2016-01-25 DIAGNOSIS — R4781 Slurred speech: Secondary | ICD-10-CM | POA: Diagnosis not present

## 2016-01-25 LAB — CBC WITH DIFFERENTIAL/PLATELET
BASOS ABS: 0 10*3/uL (ref 0.0–0.1)
Basophils Relative: 0 %
Eosinophils Absolute: 0.1 10*3/uL (ref 0.0–0.7)
Eosinophils Relative: 1 %
HEMATOCRIT: 39.4 % (ref 36.0–46.0)
Hemoglobin: 13.2 g/dL (ref 12.0–15.0)
LYMPHS PCT: 7 %
Lymphs Abs: 0.9 10*3/uL (ref 0.7–4.0)
MCH: 28.8 pg (ref 26.0–34.0)
MCHC: 33.5 g/dL (ref 30.0–36.0)
MCV: 86 fL (ref 78.0–100.0)
MONO ABS: 0.7 10*3/uL (ref 0.1–1.0)
Monocytes Relative: 6 %
NEUTROS ABS: 10.9 10*3/uL — AB (ref 1.7–7.7)
Neutrophils Relative %: 86 %
Platelets: 164 10*3/uL (ref 150–400)
RBC: 4.58 MIL/uL (ref 3.87–5.11)
RDW: 14.4 % (ref 11.5–15.5)
WBC: 12.8 10*3/uL — ABNORMAL HIGH (ref 4.0–10.5)

## 2016-01-25 LAB — TROPONIN I: Troponin I: <0.03 ng/mL (ref <0.031)

## 2016-01-25 LAB — COMPREHENSIVE METABOLIC PANEL
ALBUMIN: 3.9 g/dL (ref 3.5–5.0)
ALK PHOS: 116 U/L (ref 38–126)
ALT: 16 U/L (ref 14–54)
AST: 24 U/L (ref 15–41)
Anion gap: 12 (ref 5–15)
BILIRUBIN TOTAL: 1.3 mg/dL — AB (ref 0.3–1.2)
BUN: 16 mg/dL (ref 6–20)
CO2: 25 mmol/L (ref 22–32)
CREATININE: 0.53 mg/dL (ref 0.44–1.00)
Calcium: 8.5 mg/dL — ABNORMAL LOW (ref 8.9–10.3)
Chloride: 95 mmol/L — ABNORMAL LOW (ref 101–111)
GFR calc Af Amer: >60 mL/min (ref >60)
GFR calc non Af Amer: >60 mL/min (ref >60)
GLUCOSE: 205 mg/dL — AB (ref 65–99)
POTASSIUM: 3.4 mmol/L — AB (ref 3.5–5.1)
Sodium: 132 mmol/L — ABNORMAL LOW (ref 135–145)
TOTAL PROTEIN: 7.7 g/dL (ref 6.5–8.1)

## 2016-01-25 LAB — URINALYSIS, ROUTINE W REFLEX MICROSCOPIC
BILIRUBIN URINE: NEGATIVE
Glucose, UA: NEGATIVE mg/dL
KETONES UR: 15 mg/dL — AB
LEUKOCYTES UA: NEGATIVE
NITRITE: NEGATIVE
PROTEIN: 100 mg/dL — AB
Specific Gravity, Urine: 1.02 (ref 1.005–1.030)
pH: 5 (ref 5.0–8.0)

## 2016-01-25 LAB — LACTIC ACID, PLASMA
LACTIC ACID, VENOUS: 1.5 mmol/L (ref 0.5–2.0)
Lactic Acid, Venous: 1.8 mmol/L (ref 0.5–2.0)

## 2016-01-25 LAB — DIGOXIN LEVEL: DIGOXIN LVL: 1 ng/mL (ref 0.8–2.0)

## 2016-01-25 LAB — PROTIME-INR
INR: 2.44 — AB (ref 0.00–1.49)
Prothrombin Time: 26.2 seconds — ABNORMAL HIGH (ref 11.6–15.2)

## 2016-01-25 LAB — BASIC METABOLIC PANEL
BUN: 13 mg/dL (ref 7–25)
CALCIUM: 9.1 mg/dL (ref 8.6–10.4)
CHLORIDE: 95 mmol/L — AB (ref 98–110)
CO2: 23 mmol/L (ref 20–31)
CREATININE: 0.48 mg/dL — AB (ref 0.60–0.93)
Glucose, Bld: 117 mg/dL — ABNORMAL HIGH (ref 65–99)
Potassium: 3.5 mmol/L (ref 3.5–5.3)
Sodium: 134 mmol/L — ABNORMAL LOW (ref 135–146)

## 2016-01-25 LAB — URINE MICROSCOPIC-ADD ON

## 2016-01-25 LAB — LIPASE, BLOOD: Lipase: 29 U/L (ref 11–51)

## 2016-01-25 LAB — BRAIN NATRIURETIC PEPTIDE: B NATRIURETIC PEPTIDE 5: 432 pg/mL — AB (ref 0.0–100.0)

## 2016-01-25 LAB — MAGNESIUM: Magnesium: 1.7 mg/dL (ref 1.7–2.4)

## 2016-01-25 MED ORDER — SODIUM CHLORIDE 0.9 % IV SOLN
INTRAVENOUS | Status: DC
  Administered 2016-01-25: 19:00:00 via INTRAVENOUS

## 2016-01-25 MED ORDER — FAMOTIDINE IN NACL 20-0.9 MG/50ML-% IV SOLN
20.0000 mg | Freq: Once | INTRAVENOUS | Status: AC
  Administered 2016-01-25: 20 mg via INTRAVENOUS
  Filled 2016-01-25: qty 50

## 2016-01-25 NOTE — ED Notes (Signed)
Assisted patient from bed to bed side commode and back.

## 2016-01-25 NOTE — ED Notes (Signed)
Pt transported to and from CT scan.

## 2016-01-25 NOTE — ED Notes (Signed)
Pt brought in by [facility] from [facility]. Pt was attempting to eat dinner and starting vomiting and said "I don't feel good". EMS was called and IV started, 20g rt wrist, Zofran 4mg  IV given by EMS.

## 2016-01-25 NOTE — H&P (Signed)
[facility] History and Physical  Angel Torres WRU:[id number] DOB: [birth year]    PCP:   [facility]   Chief Complaint: Emesis  HPI: Angel Torres is an 70 y.o. female with a hx of HTN, atrial fibrillation, and diastolic CHF that presents from [facility] with vomiting. Per family members, patient was eating dinner when she stated that she had a headache and began vomiting. EMS gave IV Zofran with improvement however she still reports not feeling well. Unclear if the patient is having abdominal pain. Family members at bedside believe that the patient's speech is slurred and state that she has been unable to ambulate at her baseline today. They also state that she has been complaining of left ankle pain and swelling recently. They are unable to give specific last known well time. While in the ED, vital signs were stable. Labs revelead a unremarkable BMP, negative troponin and lactic acid, BNP 432, and WBC 12.8. EKG, CXR, and AAS were unremarkable and head CT did not show any acute changes. Hospitalist was asked to admit for TIA workup.   Rewiew of Systems: Limited secondary to patient's baseline mental status. Majority provided by family.  Constitutional: Negative for malaise, fever and chills. No significant weight loss or weight gain Eyes: Negative for eye pain, redness and discharge, diplopia, visual changes, or flashes of light. ENMT: Negative for ear pain, hoarseness, nasal congestion, sinus pressure and sore throat. No headaches; tinnitus, drooling, or problem swallowing. Cardiovascular: Negative for chest pain, palpitations, diaphoresis, dyspnea and peripheral edema. ; No orthopnea, PND Respiratory: Negative for cough, hemoptysis, wheezing and stridor. No pleuritic chestpain. Gastrointestinal: Positive for nausea and vomiting. Negative for diarrhea, constipation, melena, blood in stool, hematemesis, jaundice and rectal bleeding.    Genitourinary: Negative for  frequency, dysuria, incontinence,flank pain and hematuria; Musculoskeletal: Left ankle pain and swelling. Negative for back pain and neck pain.  Skin: . Negative for pruritus, rash, abrasions, bruising and skin lesion.; ulcerations Neuro: Positive for slurred speech and difficulty ambulating. Negative for headache, lightheadedness and neck stiffness. Negative for weakness, altered level of consciousness , altered mental status, extremity weakness, burning feet, involuntary movement, seizure and syncope.  Psych: negative for anxiety, depression, insomnia, tearfulness, panic attacks, hallucinations, paranoia, suicidal or homicidal ideation     Past Medical History  Diagnosis Date  . Down syndrome   . Essential hypertension   . Systolic dysfunction     LVEF 91%, indeterminate diastolic function  . Chronic atrial fibrillation (HCC)   . Pneumonia   . Pulmonary embolism (HCC)   . Leg fracture   . Pulmonary hypertension (HCC)     Moderate  . Right ventricular dysfunction     Past Surgical History  Procedure Laterality Date  . Thoracentesis Right 12/09/2015    Medications:  HOME MEDS: Prior to Admission medications   Medication Sig Start Date End Date Taking? Authorizing Provider  alendronate (FOSAMAX) 70 MG tablet Take 70 mg by mouth once a week. Takes on Mondays. Take with a full glass of water on an empty stomach.   Yes Historical Provider, MD  betamethasone dipropionate (DIPROLENE) 0.05 % cream Apply 1 application topically 2 (two) times daily as needed (for itching).   Yes Historical Provider, MD  Calcium Carbonate (CALCIUM 600 PO) Take 600 mg by mouth daily.   Yes Historical Provider, MD  digoxin (LANOXIN) 0.25 MG tablet Take 250 mcg by mouth daily.   Yes Historical Provider, MD  diltiazem (CARDIZEM) 60 MG tablet Take 1  tablet (60 mg total) by mouth 3 (three) times daily. 12/27/15  Yes Elliot Cousin, MD  furosemide (LASIX) 40 MG tablet Take 80 mg in the morning and 40 mg in the  evening Patient taking differently: Take 40-80 mg by mouth 2 (two) times daily. Take 80 mg in the morning and 40 mg in the evening 01/01/16  Yes Jonelle Sidle, MD  glipiZIDE (GLUCOTROL) 5 MG tablet Take 1 tablet (5 mg total) by mouth 2 (two) times daily before a meal. HOLD IF BLOOD SUGAR IS LESS THAN 115. 12/27/15  Yes Elliot Cousin, MD  lisinopril (PRINIVIL,ZESTRIL) 5 MG tablet Take 5 mg by mouth daily.   Yes Historical Provider, MD  metFORMIN (GLUCOPHAGE-XR) 500 MG 24 hr tablet Take 1 tablet (500 mg total) by mouth daily. DO NOT START UNTIL 12/28/2015. 12/27/15  Yes Elliot Cousin, MD  metoprolol tartrate (LOPRESSOR) 25 MG tablet Take 25 mg by mouth 2 (two) times daily.   Yes Historical Provider, MD  triamcinolone cream (KENALOG) 0.1 % Apply 1 application topically 2 (two) times daily.   Yes Historical Provider, MD  Vitamin D, Cholecalciferol, 400 UNITS TABS Take 400 Units by mouth daily.   Yes Historical Provider, MD  warfarin (COUMADIN) 2 MG tablet Take 1-2 tablets by mouth See admin instructions. Take 1 tablet by mouth on Tuesday, Thursday and Saturday.  Take (1/2) tablet by mouth on Sunday, Monday, Wednesday, and Friday. 12/02/15  Yes Historical Provider, MD  warfarin (COUMADIN) 6 MG tablet Take 6 mg by mouth every evening.   Yes Historical Provider, MD     Allergies:  No Known Allergies  Social History:   reports that she has never smoked. She has never used smokeless tobacco. She reports that she does not drink alcohol or use illicit drugs.  Family History: Family History  Problem Relation Age of Onset  . Arrhythmia Father   . Arrhythmia Brother      Physical Exam: Filed Vitals:   01/25/16 1930 01/25/16 2030 01/25/16 2130 01/25/16 2150  BP: 139/64 119/66 150/75 151/72  Pulse: 37   67  Temp:      TempSrc:      Resp: Weight:      SpO2: 95%   98%   Blood pressure 151/72, pulse 67, temperature 98 F (36.7 C), temperature source Oral, resp. rate 17, weight 68.04 kg (150  lb), SpO2 98 %.  GEN:  Pleasant. Patient lying in the stretcher in no acute distress; cooperative with exam. PSYCH:  alert and oriented x4; does not appear anxious or depressed; affect is appropriate. HEENT: Mucous membranes pink and anicteric; PERRLA; EOM intact; no cervical lymphadenopathy nor thyromegaly or carotid bruit; no JVD; There were no stridor. Neck is very supple. Breasts:: Not examined CHEST WALL: No tenderness CHEST: Normal respiration, clear to auscultation bilaterally.  HEART: Regular rate and rhythm.  There are no murmur, rub, or gallops.   BACK: No kyphosis or scoliosis; no CVA tenderness ABDOMEN: soft and non-tender; no masses, no organomegaly, normal abdominal bowel sounds; no pannus; no intertriginous candida. There is no rebound and no distention. Rectal Exam: Not done EXTREMITIES: TTP to rotation of left ankle, no point tenderness. Age-appropriate arthropathy of the hands and knees; no edema; no ulcerations.  There is no calf tenderness. Genitalia: not examined PULSES: 2+ and symmetric SKIN: Normal hydration no rash or ulceration CNS: Cranial nerves 2-12 grossly intact no focal lateralizing neurologic deficit. Wide nipple, low ear set.    Labs  on Admission:  Basic Metabolic Panel:  Recent Labs Lab 01/24/16 0801 01/25/16 1905 01/25/16 2217  NA 134* 132*  --   K 3.5 3.4*  --   CL 95* 95*  --   CO2 23 25  --   GLUCOSE 117* 205*  --   BUN 13 16  --   CREATININE 0.48* 0.53  --   CALCIUM 9.1 8.5*  --   MG  --   --  1.7   Liver Function Tests:  Recent Labs Lab 01/25/16 1905  AST 24  ALT 16  ALKPHOS 116  BILITOT 1.3*  PROT 7.7  ALBUMIN 3.9    Recent Labs Lab 01/25/16 1905  LIPASE 29    CBC:  Recent Labs Lab 01/25/16 1905  WBC 12.8*  NEUTROABS 10.9*  HGB 13.2  HCT 39.4  MCV 86.0  PLT 164   Cardiac Enzymes:  Recent Labs Lab 01/25/16 1905  TROPONINI <0.03     Radiological Exams on Admission: Ct Head Wo Contrast  01/25/2016   CLINICAL DATA:  Slurred speech. EXAM: CT HEAD WITHOUT CONTRAST TECHNIQUE: Contiguous axial images were obtained from the base of the skull through the vertex without intravenous contrast. COMPARISON:  None FINDINGS: There is patchy low attenuation within the subcortical white matter bilaterally compatible with chronic microvascular disease. There is no abnormal extra-axial fluid collection, intracranial hemorrhage or mass identified. No evidence for acute brain infarct. There is mild scratch set bilateral maxillary sinus disease identified. There is opacification of the left mastoid air cells. The right mastoid air cells appear underdeveloped. The calvarium is intact. IMPRESSION: 1. Small vessel ischemic disease. 2. No acute intracranial abnormalities noted. 3. Left mastoid air cell opacification. Mild bilateral maxillary sinus disease also noted. Electronically Signed   By: Signa Kell M.D.   On: 01/25/2016 23:15   Dg Abd Acute W/chest  01/25/2016  CLINICAL DATA:  Nausea and vomiting beginning today. Atrial fibrillation. Systolic dysfunction. Hypertension. EXAM: DG ABDOMEN ACUTE W/ 1V CHEST COMPARISON:  12/25/2015 FINDINGS: There is no evidence of dilated bowel loops or free intraperitoneal air. No radiopaque calculi or other significant radiographic abnormality is seen. Moderate cardiomegaly stable. Small right pleural effusion and bibasilar atelectasis show no significant change. IMPRESSION: Unremarkable bowel gas pattern. Stable cardiomegaly, small right pleural effusion, and bibasilar atelectasis. Electronically Signed   By: Myles Rosenthal M.D.   On: 01/25/2016 21:00    EKG: Independently reviewed.    Assessment/Plan: Vertigo of central origin CVA Nausea and vomiting Essential HTN Atrial fibrillation   PLAN: Patient presents with nausea and vomiting, possibly related to vertigo of central origin. AAS is unremarkable and vomiting has resolved with IV Zofran. Will continue to monitor and treat  supportively. Per family members, she is also having slurred speech and difficulty ambulating today, making a posterior CVA/TIA very likely. Head CT is negative for acute disease. Will admit to tele and order MRI/MRA head, carotid US, ECHO, Hgb A1C and lipid profile. She is already anticoagulated with Coumadin. Will start on a statin and add ASA with PPI, and continue her chronic medications.  For her left ankle pain, we will order an xray and place an air splint.  She is stable, full code, and will be admitted to [facility] service.  Thank you and Good Day.   Other plans as per orders. Code Status: Full   Houston Siren. MD. FACP [facility] Pager [phone] 7pm to 7am.  01/25/2016, 11:25 PM  By signing my name below, I, Burnett Harry,  attest that this documentation has been prepared under the direction and in the presence of Houston Sireneter Maahi Lannan. MD Electronically Signed: Burnett HarryJennifer Gregorio, Scribe. 01/25/2016   I, Dr. Houston SirenPeter Tyne Banta, personally performed the services described in this documentation. All medical record entries made by the scribe were at my discretion and in my presence. Houston SirenPeter Shun Pletz, MD 01/25/2016

## 2016-01-25 NOTE — ED Provider Notes (Signed)
CSN: [id number]     Arrival date & time 01/25/16  1842 History   First MD Initiated Contact with Patient 01/25/16 1846     Chief Complaint  Patient presents with  . Emesis     Patient is a 70 y.o. female presenting with vomiting. The history is provided by the EMS personnel and the nursing home. The history is limited by the condition of the patient (Hx confusion).  Emesis Pt was seen at 1845. Per EMS and NH report: Pt was attempting to eat dinner when she started vomiting and stating she "didn't feel good." EMS gave IV zofran with improvement in vomiting. Pt continues to state she "doesn't feel good." No reported diarrhea, no fevers, no black or blood in emesis. Pt denies CP/SOB. Unclear if she is having abd pain.   Past Medical History  Diagnosis Date  . Down syndrome   . Essential hypertension   . Systolic dysfunction     LVEF 40%, indeterminate diastolic function  . Chronic atrial fibrillation (HCC)   . Pneumonia   . Pulmonary embolism (HCC)   . Leg fracture   . Pulmonary hypertension (HCC)     Moderate  . Right ventricular dysfunction    Past Surgical History  Procedure Laterality Date  . Thoracentesis Right 12/09/2015   Family History  Problem Relation Age of Onset  . Arrhythmia Father   . Arrhythmia Brother    Social History  Substance Use Topics  . Smoking status: Never Smoker   . Smokeless tobacco: Never Used  . Alcohol Use: No    Review of Systems  Unable to perform ROS: Other  Gastrointestinal: Positive for vomiting.      Allergies  Review of patient's allergies indicates no known allergies.  Home Medications   Prior to Admission medications   Medication Sig Start Date End Date Taking? Authorizing Provider  alendronate (FOSAMAX) 70 MG tablet Take 70 mg by mouth once a week. Takes on Mondays. Take with a full glass of water on an empty stomach.    Historical Provider, MD  betamethasone dipropionate (DIPROLENE) 0.05 % cream Apply 1 application topically  2 (two) times daily as needed (for itching).    Historical Provider, MD  Calcium Carbonate (CALCIUM 600 PO) Take 600 mg by mouth daily.    Historical Provider, MD  digoxin (LANOXIN) 0.25 MG tablet Take 250 mcg by mouth daily.    Historical Provider, MD  diltiazem (CARDIZEM) 60 MG tablet Take 1 tablet (60 mg total) by mouth 3 (three) times daily. 12/27/15   Elliot Cousin, MD  furosemide (LASIX) 40 MG tablet Take 80 mg in the morning and 40 mg in the evening 01/01/16   Jonelle Sidle, MD  glipiZIDE (GLUCOTROL) 5 MG tablet Take 1 tablet (5 mg total) by mouth 2 (two) times daily before a meal. HOLD IF BLOOD SUGAR IS LESS THAN 115. 12/27/15   Elliot Cousin, MD  lisinopril (PRINIVIL,ZESTRIL) 5 MG tablet Take 5 mg by mouth daily.    Historical Provider, MD  metFORMIN (GLUCOPHAGE-XR) 500 MG 24 hr tablet Take 1 tablet (500 mg total) by mouth daily. DO NOT START UNTIL 12/28/2015. 12/27/15   Elliot Cousin, MD  metoprolol tartrate (LOPRESSOR) 25 MG tablet Take 25 mg by mouth 2 (two) times daily.    Historical Provider, MD  triamcinolone cream (KENALOG) 0.1 % Apply 1 application topically 2 (two) times daily.    Historical Provider, MD  Vitamin D, Cholecalciferol, 400 UNITS TABS Take 400 Units by mouth  daily.    Historical Provider, MD  warfarin (COUMADIN) 2 MG tablet Take 1-2 tablets by mouth See admin instructions. Take 1 tablet by mouth on Tuesday, Thursday and Saturday.  Take (1/2) tablet by mouth on Sunday, Monday, Wednesday, and Friday. 12/02/15   Historical Provider, MD  warfarin (COUMADIN) 6 MG tablet Take 6 mg by mouth every evening.    Historical Provider, MD   BP 139/64 mmHg  Pulse 37  Temp(Src) 98 F (36.7 C) (Oral)  Resp 23  Wt 150 lb (68.04 kg)  SpO2 95% Physical Exam  1850: Physical examination:  Nursing notes reviewed; Vital signs and O2 SAT reviewed;  Constitutional: Well developed, Well nourished, Well hydrated, In no acute distress; Head:  Normocephalic, atraumatic; Eyes: EOMI, PERRL, No scleral  icterus; ENMT: Mouth and pharynx normal, Mucous membranes moist; Neck: Supple, Full range of motion, No lymphadenopathy; Cardiovascular: Irregular rate and rhythm, No gallop; Respiratory: Breath sounds clear & equal bilaterally, No wheezes.  Speaking full sentences with ease, Normal respiratory effort/excursion; Chest: Nontender, Movement normal; Abdomen: Soft, Nontender, Nondistended, Normal bowel sounds; Genitourinary: No CVA tenderness; Extremities: Pulses normal, No tenderness, No edema, No calf edema or asymmetry.; Neuro: Awake, alert, confused per baseline. Speech clear. Moves all extremities spontaneously..; Skin: Color normal, Warm, Dry.   ED Course  Procedures (including critical care time) Labs Review  Imaging Review  I have personally reviewed and evaluated these images and lab results as part of my medical decision-making.   EKG Interpretation   Date/Time:  Saturday January 25 2016 19:29:00 EST Ventricular Rate:  92 PR Interval:  228 QRS Duration: 112 QT Interval:  452 QTC Calculation: 559 R Axis:   147 Text Interpretation:  Atrial fibrillation Ventricular bigeminy IRBBB and  LPFB Anteroseptal infarct, old Borderline ST depression, lateral leads  When compared with ECG of 12/24/2015 Nonspecific ST and T wave abnormality  is now Present Confirmed by [facility]  MD, Nicholos Johns [phone]) on 01/25/2016  7:57:44 PM      MDM  MDM Reviewed: previous chart, nursing note and vitals Reviewed previous: labs and ECG Interpretation: labs, ECG, x-ray and CT scan   Results for orders placed or performed during the hospital encounter of 01/25/16  Comprehensive metabolic panel  Result Value Ref Range   Sodium 132 (L) 135 - 145 mmol/L   Potassium 3.4 (L) 3.5 - 5.1 mmol/L   Chloride 95 (L) 101 - 111 mmol/L   CO2 25 22 - 32 mmol/L   Glucose, Bld 205 (H) 65 - 99 mg/dL   BUN 16 6 - 20 mg/dL   Creatinine, Ser 6.04 0.44 - 1.00 mg/dL   Calcium 8.5 (L) 8.9 - 10.3 mg/dL   Total Protein 7.7 6.5 -  8.1 g/dL   Albumin 3.9 3.5 - 5.0 g/dL   AST 24 15 - 41 U/L   ALT 16 14 - 54 U/L   Alkaline Phosphatase 116 38 - 126 U/L   Total Bilirubin 1.3 (H) 0.3 - 1.2 mg/dL   GFR calc non Af Amer >60 >60 mL/min   GFR calc Af Amer >60 >60 mL/min   Anion gap 12 5 - 15  Lipase, blood  Result Value Ref Range   Lipase 29 11 - 51 U/L  Troponin I  Result Value Ref Range   Troponin I <0.03 <0.031 ng/mL  Lactic acid, plasma  Result Value Ref Range   Lactic Acid, Venous 1.8 0.5 - 2.0 mmol/L  CBC with Differential  Result Value Ref Range   WBC  12.8 (H) 4.0 - 10.5 K/uL   RBC 4.58 3.87 - 5.11 MIL/uL   Hemoglobin 13.2 12.0 - 15.0 g/dL   HCT 16.1 09.6 - 04.5 %   MCV 86.0 78.0 - 100.0 fL   MCH 28.8 26.0 - 34.0 pg   MCHC 33.5 30.0 - 36.0 g/dL   RDW 40.9 81.1 - 91.4 %   Platelets 164 150 - 400 K/uL   Neutrophils Relative % 86 %   Neutro Abs 10.9 (H) 1.7 - 7.7 K/uL   Lymphocytes Relative 7 %   Lymphs Abs 0.9 0.7 - 4.0 K/uL   Monocytes Relative 6 %   Monocytes Absolute 0.7 0.1 - 1.0 K/uL   Eosinophils Relative 1 %   Eosinophils Absolute 0.1 0.0 - 0.7 K/uL   Basophils Relative 0 %   Basophils Absolute 0.0 0.0 - 0.1 K/uL  Urinalysis, Routine w reflex microscopic  Result Value Ref Range   Color, Urine YELLOW YELLOW   APPearance CLEAR CLEAR   Specific Gravity, Urine 1.020 1.005 - 1.030   pH 5.0 5.0 - 8.0   Glucose, UA NEGATIVE NEGATIVE mg/dL   Hgb urine dipstick TRACE (A) NEGATIVE   Bilirubin Urine NEGATIVE NEGATIVE   Ketones, ur 15 (A) NEGATIVE mg/dL   Protein, ur 782 (A) NEGATIVE mg/dL   Nitrite NEGATIVE NEGATIVE   Leukocytes, UA NEGATIVE NEGATIVE  Protime-INR  Result Value Ref Range   Prothrombin Time 26.2 (H) 11.6 - 15.2 seconds   INR 2.44 (H) 0.00 - 1.49  Digoxin level  Result Value Ref Range   Digoxin Level 1.0 0.8 - 2.0 ng/mL  Urine microscopic-add on  Result Value Ref Range   Squamous Epithelial / LPF 0-5 (A) NONE SEEN   WBC, UA 0-5 0 - 5 WBC/hpf   RBC / HPF 0-5 0 - 5 RBC/hpf    Bacteria, UA MANY (A) NONE SEEN   Dg Abd Acute W/chest 01/25/2016  CLINICAL DATA:  Nausea and vomiting beginning today. Atrial fibrillation. Systolic dysfunction. Hypertension. EXAM: DG ABDOMEN ACUTE W/ 1V CHEST COMPARISON:  12/25/2015 FINDINGS: There is no evidence of dilated bowel loops or free intraperitoneal air. No radiopaque calculi or other significant radiographic abnormality is seen. Moderate cardiomegaly stable. Small right pleural effusion and bibasilar atelectasis show no significant change. IMPRESSION: Unremarkable bowel gas pattern. Stable cardiomegaly, small right pleural effusion, and bibasilar atelectasis. Electronically Signed   By: Myles Rosenthal M.D.   On: 01/25/2016 21:00   Ct Head Wo Contrast 01/25/2016  CLINICAL DATA:  Slurred speech. EXAM: CT HEAD WITHOUT CONTRAST TECHNIQUE: Contiguous axial images were obtained from the base of the skull through the vertex without intravenous contrast. COMPARISON:  None FINDINGS: There is patchy low attenuation within the subcortical white matter bilaterally compatible with chronic microvascular disease. There is no abnormal extra-axial fluid collection, intracranial hemorrhage or mass identified. No evidence for acute brain infarct. There is mild scratch set bilateral maxillary sinus disease identified. There is opacification of the left mastoid air cells. The right mastoid air cells appear underdeveloped. The calvarium is intact. IMPRESSION: 1. Small vessel ischemic disease. 2. No acute intracranial abnormalities noted. 3. Left mastoid air cell opacification. Mild bilateral maxillary sinus disease also noted. Electronically Signed   By: Signa Kell M.D.   On: 01/25/2016 23:15    2110:  Multiple family members have now appeared in pt's exam room: Pt's family states they feel pt's speech is slurred, she c/o "dizziness" and "inability to walk" over the course of the day today, and  are agitated that pt had 2 recent admissions for CHF "and no one did  anything." Demanding "more thorough testing be done" as well as admission. NH report did not tell EMS or ED staff of these symptoms, family and pt cannot give exact LKW. Not code stroke at this time. Will obtain CT-H.   2345: CT-H reassuring. No clear UTI on Udip; UC is pending.  T/C to [facility] Dr. Conley RollsLe, case discussed, including:  HPI, pertinent PM/SHx, VS/PE, dx testing, ED course and treatment:  Agreeable to observation admit, requests to write temporary orders, obtain tele bed to team APAdmits.   Samuel JesterKathleen Keshun Berrett, DO 01/29/16 [phone]

## 2016-01-25 NOTE — ED Notes (Signed)
Family at bedside, no needs identified at this time 

## 2016-01-25 NOTE — ED Notes (Signed)
Pt transported to and from CT scan. Family in room, family states pt has "slurred speech." Pt has no facial droop, no drift upper or lower extremities, strong grips bilateral, MD made aware.

## 2016-01-26 ENCOUNTER — Observation Stay (HOSPITAL_COMMUNITY): Payer: Medicare Other

## 2016-01-26 ENCOUNTER — Encounter (HOSPITAL_COMMUNITY): Payer: Self-pay | Admitting: Internal Medicine

## 2016-01-26 ENCOUNTER — Observation Stay (HOSPITAL_BASED_OUTPATIENT_CLINIC_OR_DEPARTMENT_OTHER): Payer: Medicare Other

## 2016-01-26 DIAGNOSIS — R110 Nausea: Secondary | ICD-10-CM

## 2016-01-26 DIAGNOSIS — G459 Transient cerebral ischemic attack, unspecified: Secondary | ICD-10-CM

## 2016-01-26 DIAGNOSIS — I480 Paroxysmal atrial fibrillation: Secondary | ICD-10-CM | POA: Insufficient documentation

## 2016-01-26 DIAGNOSIS — I5041 Acute combined systolic (congestive) and diastolic (congestive) heart failure: Secondary | ICD-10-CM | POA: Diagnosis not present

## 2016-01-26 DIAGNOSIS — I509 Heart failure, unspecified: Secondary | ICD-10-CM | POA: Diagnosis not present

## 2016-01-26 DIAGNOSIS — Z7901 Long term (current) use of anticoagulants: Secondary | ICD-10-CM

## 2016-01-26 DIAGNOSIS — I6523 Occlusion and stenosis of bilateral carotid arteries: Secondary | ICD-10-CM | POA: Diagnosis not present

## 2016-01-26 DIAGNOSIS — R112 Nausea with vomiting, unspecified: Secondary | ICD-10-CM | POA: Diagnosis not present

## 2016-01-26 DIAGNOSIS — S8252XA Displaced fracture of medial malleolus of left tibia, initial encounter for closed fracture: Secondary | ICD-10-CM | POA: Diagnosis not present

## 2016-01-26 LAB — PROTIME-INR
INR: 2.9 — ABNORMAL HIGH (ref 0.00–1.49)
PROTHROMBIN TIME: 29.8 s — AB (ref 11.6–15.2)

## 2016-01-26 LAB — FOLATE: FOLATE: 13.1 ng/mL (ref >5.9)

## 2016-01-26 LAB — VITAMIN B12: VITAMIN B 12: 426 pg/mL (ref 180–914)

## 2016-01-26 LAB — LIPID PANEL
CHOL/HDL RATIO: 3.3 ratio
Cholesterol: 80 mg/dL (ref 0–200)
HDL: 24 mg/dL — AB (ref >40)
LDL Cholesterol: 45 mg/dL (ref 0–99)
Triglycerides: 56 mg/dL (ref <150)
VLDL: 11 mg/dL (ref 0–40)

## 2016-01-26 LAB — MRSA PCR SCREENING: MRSA by PCR: NEGATIVE

## 2016-01-26 LAB — TSH: TSH: 0.503 u[IU]/mL (ref 0.350–4.500)

## 2016-01-26 MED ORDER — SENNOSIDES-DOCUSATE SODIUM 8.6-50 MG PO TABS: 1.0000 | ORAL_TABLET | Freq: Every evening | ORAL | Status: DC | PRN

## 2016-01-26 MED ORDER — OXYCODONE-ACETAMINOPHEN 5-325 MG PO TABS
1.0000 | ORAL_TABLET | ORAL | Status: DC | PRN
  Administered 2016-01-27 – 2016-01-28 (×3): 1 via ORAL
  Administered 2016-01-29 (×2): 2 via ORAL
  Filled 2016-01-26 (×2): qty 1
  Filled 2016-01-26 (×2): qty 2
  Filled 2016-01-26: qty 1

## 2016-01-26 MED ORDER — ASPIRIN 300 MG RE SUPP: 300.0000 mg | Freq: Every day | RECTAL | Status: DC

## 2016-01-26 MED ORDER — LISINOPRIL 5 MG PO TABS
5.0000 mg | ORAL_TABLET | Freq: Every day | ORAL | Status: DC
  Administered 2016-01-26 – 2016-01-27 (×2): 5 mg via ORAL
  Filled 2016-01-26 (×2): qty 1

## 2016-01-26 MED ORDER — STROKE: EARLY STAGES OF RECOVERY BOOK
Freq: Once | Status: AC
  Administered 2016-01-26: 09:00:00
  Filled 2016-01-26: qty 1

## 2016-01-26 MED ORDER — FUROSEMIDE 40 MG PO TABS
40.0000 mg | ORAL_TABLET | Freq: Every day | ORAL | Status: DC
  Administered 2016-01-26 – 2016-01-30 (×5): 40 mg via ORAL
  Filled 2016-01-26 (×5): qty 1

## 2016-01-26 MED ORDER — METOPROLOL TARTRATE 25 MG PO TABS
25.0000 mg | ORAL_TABLET | Freq: Two times a day (BID) | ORAL | Status: DC
  Administered 2016-01-26 – 2016-01-27 (×4): 25 mg via ORAL
  Filled 2016-01-26 (×4): qty 1

## 2016-01-26 MED ORDER — CALCIUM CARBONATE 1500 (600 CA) MG PO TABS
600.0000 mg | ORAL_TABLET | Freq: Every day | ORAL | Status: DC
  Filled 2016-01-26 (×2): qty 1

## 2016-01-26 MED ORDER — ASPIRIN 325 MG PO TABS
325.0000 mg | ORAL_TABLET | Freq: Every day | ORAL | Status: DC
  Administered 2016-01-26 – 2016-01-28 (×3): 325 mg via ORAL
  Filled 2016-01-26 (×3): qty 1

## 2016-01-26 MED ORDER — DIGOXIN 125 MCG PO TABS
250.0000 ug | ORAL_TABLET | Freq: Every day | ORAL | Status: DC
  Administered 2016-01-26 – 2016-01-30 (×5): 250 ug via ORAL
  Filled 2016-01-26 (×5): qty 2

## 2016-01-26 MED ORDER — DILTIAZEM HCL 60 MG PO TABS
60.0000 mg | ORAL_TABLET | Freq: Three times a day (TID) | ORAL | Status: DC
  Administered 2016-01-26 – 2016-01-30 (×13): 60 mg via ORAL
  Filled 2016-01-26 (×14): qty 1

## 2016-01-26 MED ORDER — MECLIZINE HCL 12.5 MG PO TABS
12.5000 mg | ORAL_TABLET | Freq: Three times a day (TID) | ORAL | Status: DC
  Administered 2016-01-26 – 2016-01-30 (×13): 12.5 mg via ORAL
  Filled 2016-01-26 (×14): qty 1

## 2016-01-26 MED ORDER — WARFARIN - PHARMACIST DOSING INPATIENT
Status: DC
Start: 2016-01-26 — End: 2016-01-30
  Administered 2016-01-28 – 2016-01-29 (×2)

## 2016-01-26 MED ORDER — ONDANSETRON HCL 4 MG/2ML IJ SOLN: 4.0000 mg | Freq: Three times a day (TID) | INTRAMUSCULAR | Status: AC | PRN

## 2016-01-26 MED ORDER — WARFARIN SODIUM 2 MG PO TABS
4.0000 mg | ORAL_TABLET | Freq: Once | ORAL | Status: AC
  Administered 2016-01-26: 4 mg via ORAL
  Filled 2016-01-26: qty 2

## 2016-01-26 MED ORDER — CALCIUM CARBONATE 1250 (500 CA) MG PO TABS
1.0000 | ORAL_TABLET | Freq: Every day | ORAL | Status: DC
Start: 2016-01-26 — End: 2016-01-30
  Administered 2016-01-26 – 2016-01-30 (×5): 500 mg via ORAL
  Filled 2016-01-26 (×5): qty 1

## 2016-01-26 MED ORDER — ALENDRONATE SODIUM 70 MG PO TABS: 70.0000 mg | ORAL_TABLET | ORAL | Status: DC

## 2016-01-26 NOTE — Progress Notes (Signed)

## 2016-01-26 NOTE — Progress Notes (Signed)
[facility]                                                                              Patient Demographics  Angel Torres, is a 70 y.o. female, DOB - [birth year], RUE:[id number]  Admit date - 01/25/2016   Admitting Physician Houston Siren, MD  Outpatient Primary MD for the patient is [facility]  LOS -    Chief Complaint  Patient presents with  . Emesis       Brief HPI   Angel Torres is an 70 y.o. female with a hx of HTN, atrial fibrillation, and diastolic CHF that presents from [facility] with vomiting. Per family members, patient was eating dinner when she stated that she had a headache and began vomiting. EMS gave IV Zofran with improvement however she still reports not feeling well. Unclear if the patient is having abdominal pain. Family members at bedside believe that the patient's speech is slurred and state that she has been unable to ambulate at her baseline today. They also state that she has been complaining of left ankle pain and swelling recently. They are unable to give specific last known well time. While in the ED, vital signs were stable. Labs revelead a unremarkable BMP, negative troponin and lactic acid, BNP 432, and WBC 12.8. EKG, CXR, and AAS were unremarkable and head CT did not show any acute changes. Hospitalist was asked to admit for TIA workup.    Assessment & Plan    Principal Problem:  Vertigo rule out TIA (transient ischemic attack)/posterior circulation CVA: Per brother at the bedside, patient was complaining of vertigo and he noticed slurred speech yesterday - Per brother at the bedside, slurred speech is improving today - Follow MRI, MRA brain, 2-D echo - Carotid Doppler showed mild bilateral carotid bifurcation plaque less than 50% diameter stenosis - Hold off on the PTOT until orthopedics evaluation - Patient passed swallow evaluation - Continue aspirin, follow lipid panel, hemoglobin A1c - Placed on meclizine,  antiemetics   Active Problems:   Chronic atrial fibrillation (HCC) - Currently rate controlled, continue Cardizem, - [place] vasc 4 Continue warfarin,    Chronic Biventricular heart failure (HCC) -Currently compensated, discontinue IV fluids to avoid fluid overload, patient tolerating diet without any difficulty. - Prior echo 1/17 had shown EF of 48%, diffuse hypokinesis - Follow I's and O's - Restart Lasix, started at lower dose  daily to avoid hypotension ( takes 80 mg in a.m. and 40 mg in p.m.)    Type 2 diabetes mellitus (HCC) - Follow hemoglobin A1c - Placed on sliding scale insulin  Left ankle fracture - X-ray showed minimally displaced fracture of the medial malleolus. Orthopedics consulted. - No PT/ OT evaluation until orthopedics evaluation.  Code Status: full code  Family Communication: Discussed in detail with the patient, all imaging results, lab results explained to the patient and brother at the bedside   Disposition Plan:   Time Spent in minutes  25 minutes  Procedures  CT head  Consults   Orthopedics   DVT Prophylaxis warfarin  Medications  Scheduled Meds: . aspirin  300 mg Rectal  Daily   Or  . aspirin  325 mg Oral Daily  . calcium carbonate  1 tablet Oral Q breakfast  . digoxin  250 mcg Oral Daily  . diltiazem  60 mg Oral TID  . lisinopril  5 mg Oral Daily  . meclizine  12.5 mg Oral TID  . metoprolol tartrate  25 mg Oral BID  . warfarin  4 mg Oral Once  . Warfarin - Pharmacist Dosing Inpatient   Does not apply Q24H   Continuous Infusions:  PRN Meds:.ondansetron (ZOFRAN) IV, oxyCODONE-acetaminophen, senna-docusate   Antibiotics   Anti-infectives    None        Subjective:   Angel Torres was seen and examined today. + Dizziness, improving. Per brother at the bedside slurred speech is also improving. Patient tolerating diet without any difficulty. Patient denies chest pain, shortness of breath, abdominal pain, N/V/D/C, new weakness,  numbess, tingling. No acute events overnight.  Left ankle swollen.  Objective:   Filed Vitals:   01/26/16 0345 01/26/16 0545 01/26/16 0748 01/26/16 0945  BP: 107/50 106/44 118/53 120/60  Pulse:  56  77  Temp: 97.7 F (36.5 C) 97.9 F (36.6 C) 97.7 F (36.5 C) 97.7 F (36.5 C)  TempSrc: Oral Oral Oral Oral  Resp: Height:      Weight:      SpO2:  96% 98% 95%    Intake/Output Summary (Last 24 hours) at 01/26/16 1027 Last data filed at 01/26/16 0911  Gross per 24 hour  Intake    240 ml  Output    400 ml  Net   -160 ml     Wt Readings from Last 3 Encounters:  01/26/16 65.3 kg (143 lb 15.4 oz)  01/15/16 66.679 kg (147 lb)  01/01/16 62.596 kg (138 lb)     Exam  General: Alert and oriented x 3, NAD  HEENT:  PERRLA, EOMI, Anicteric Sclera, mucous membranes moist.   Neck: Supple, no JVD, no masses  CVS: S1 S2 auscultated, no rubs, murmurs or gallops. Regular rate and rhythm.  Respiratory: Clear to auscultation bilaterally, no wheezing, rales or rhonchi  Abdomen: Soft, nontender, nondistended, + bowel sounds  Ext: no cyanosis clubbing or edema, left ankle swollen and tenderness , currently in splint   Neuro: AAOx3, Cr N's II- XII. Strength 5/5 upper extremities, able to lift up both lower extremities bilaterally  Skin: No rashes  Psych: Normal affect and demeanor, alert and oriented x3    Data Review   Micro Results Recent Results (from the past 240 hour(s))  MRSA PCR Screening     Status: None   Collection Time: 01/26/16  2:22 AM  Result Value Ref Range Status   MRSA by PCR NEGATIVE NEGATIVE Final    Comment:        The GeneXpert MRSA Assay (FDA approved for NASAL specimens only), is one component of a comprehensive MRSA colonization surveillance program. It is not intended to diagnose MRSA infection nor to guide or monitor treatment for MRSA infections.     Radiology Reports Dg Ankle Complete Left  01/26/2016  CLINICAL DATA:   70 year old female with left ankle pain and swelling and redness. EXAM: LEFT ANKLE COMPLETE - 3+ VIEW COMPARISON:  Radiograph dated 07/16/2008 FINDINGS: There is a minimally displaced fracture of the medial malleolus. No other acute fracture identified. The bones are osteopenic which limits evaluation for fracture. There is mild narrowing of the medial aspect of the ankle mortise. There is  diffuse subcutaneous edema with diffuse speckled skin calcification, likely related to chronic venous insufficiency. No radiopaque foreign object identified. IMPRESSION: Minimally displaced fracture of the medial malleolus. CT may provide better evaluation. Electronically Signed   By: Elgie Collard M.D.   On: 01/26/2016 01:37   Ct Head Wo Contrast  01/25/2016  CLINICAL DATA:  Slurred speech. EXAM: CT HEAD WITHOUT CONTRAST TECHNIQUE: Contiguous axial images were obtained from the base of the skull through the vertex without intravenous contrast. COMPARISON:  None FINDINGS: There is patchy low attenuation within the subcortical white matter bilaterally compatible with chronic microvascular disease. There is no abnormal extra-axial fluid collection, intracranial hemorrhage or mass identified. No evidence for acute brain infarct. There is mild scratch set bilateral maxillary sinus disease identified. There is opacification of the left mastoid air cells. The right mastoid air cells appear underdeveloped. The calvarium is intact. IMPRESSION: 1. Small vessel ischemic disease. 2. No acute intracranial abnormalities noted. 3. Left mastoid air cell opacification. Mild bilateral maxillary sinus disease also noted. Electronically Signed   By: Signa Kell M.D.   On: 01/25/2016 23:15   US Carotid Bilateral  01/26/2016  CLINICAL DATA:  TIA.  Down syndrome. EXAM: BILATERAL CAROTID DUPLEX ULTRASOUND TECHNIQUE: Wallace Cullens scale imaging, color Doppler and duplex ultrasound was performed of bilateral carotid and vertebral arteries in the neck.  COMPARISON:  None available REVIEW OF SYSTEMS: Quantification of carotid stenosis is based on velocity parameters that correlate the residual internal carotid diameter with NASCET-based stenosis levels, using the diameter of the distal internal carotid lumen as the denominator for stenosis measurement. The following velocity measurements were obtained: PEAK SYSTOLIC/END DIASTOLIC RIGHT ICA:                     78/24cm/sec CCA:                     76/11cm/sec SYSTOLIC ICA/CCA RATIO:  1.1 DIASTOLIC ICA/CCA RATIO: 2.2 ECA:                     116cm/sec LEFT ICA:                     118/26cm/sec CCA:                     86/13cm/sec SYSTOLIC ICA/CCA RATIO:  1.4 DIASTOLIC ICA/CCA RATIO: 2.0 ECA:                     135cm/sec FINDINGS: RIGHT CAROTID ARTERY: Mild plaque at the bifurcation. No high-grade stenosis. Normal waveforms and color Doppler signal. RIGHT VERTEBRAL ARTERY: Normal flow direction and waveform. A nonspecific cardiac arrhythmia is noted. LEFT CAROTID ARTERY: Mild smooth noncalcified plaque in the carotid bulb extending into proximal internal and external carotid arteries. No high-grade stenosis. Normal waveforms and color Doppler signal. LEFT VERTEBRAL ARTERY: Normal flow direction and waveform. IMPRESSION: 1. Mild bilateral carotid bifurcation plaque resulting in less than 50% diameter stenosis. The exam does not exclude plaque ulceration or embolization. Continued surveillance recommended. 2. Nonspecific cardiac arrhythmia noted. Electronically Signed   By: Corlis Leak M.D.   On: 01/26/2016 10:18   Dg Abd Acute W/chest  01/25/2016  CLINICAL DATA:  Nausea and vomiting beginning today. Atrial fibrillation. Systolic dysfunction. Hypertension. EXAM: DG ABDOMEN ACUTE W/ 1V CHEST COMPARISON:  12/25/2015 FINDINGS: There is no evidence of dilated bowel loops or free intraperitoneal air. No radiopaque calculi or other significant radiographic abnormality  is seen. Moderate cardiomegaly stable. Small right  pleural effusion and bibasilar atelectasis show no significant change. IMPRESSION: Unremarkable bowel gas pattern. Stable cardiomegaly, small right pleural effusion, and bibasilar atelectasis. Electronically Signed   By: Myles RosenthalJohn  Stahl M.D.   On: 01/25/2016 21:00   Mm Digital Screening Bilateral  01/17/2016  CLINICAL DATA:  Screening. EXAM: DIGITAL SCREENING BILATERAL MAMMOGRAM WITH CAD COMPARISON:  Previous exam(s). ACR Breast Density Category b: There are scattered areas of fibroglandular density. FINDINGS: In the right breast, calcifications warrant further evaluation with magnified views. In the left breast, no findings suspicious for malignancy. Images were processed with CAD. IMPRESSION: Further evaluation is suggested for calcifications in the right breast. RECOMMENDATION: Diagnostic mammogram of the right breast. (Code:[id number]-R-35M) The patient will be contacted regarding the findings, and additional imaging will be scheduled. BI-RADS CATEGORY  0: Incomplete. Need additional imaging evaluation and/or prior mammograms for comparison. Electronically Signed   By: Sherian ReinWei-Chen  Lin M.D.   On: 01/17/2016 13:11    CBC  Recent Labs Lab 01/25/16 1905  WBC 12.8*  HGB 13.2  HCT 39.4  PLT 164  MCV 86.0  MCH 28.8  MCHC 33.5  RDW 14.4  LYMPHSABS 0.9  MONOABS 0.7  EOSABS 0.1  BASOSABS 0.0    Chemistries   Recent Labs Lab 01/24/16 0801 01/25/16 1905 01/25/16 2217  NA 134* 132*  --   K 3.5 3.4*  --   CL 95* 95*  --   CO2 23 25  --   GLUCOSE 117* 205*  --   BUN 13 16  --   CREATININE 0.48* 0.53  --   CALCIUM 9.1 8.5*  --   MG  --   --  1.7  AST  --  24  --   ALT  --  16  --   ALKPHOS  --  116  --   BILITOT  --  1.3*  --    ------------------------------------------------------------------------------------------------------------------ estimated creatinine clearance is 56.5 mL/min (by C-G formula based on Cr of  0.53). ------------------------------------------------------------------------------------------------------------------ No results for input(s): HGBA1C in the last 72 hours. ------------------------------------------------------------------------------------------------------------------ No results for input(s): CHOL, HDL, LDLCALC, TRIG, CHOLHDL, LDLDIRECT in the last 72 hours. ------------------------------------------------------------------------------------------------------------------  Recent Labs  01/26/16 0705  TSH 0.503   ------------------------------------------------------------------------------------------------------------------ No results for input(s): VITAMINB12, FOLATE, FERRITIN, TIBC, IRON, RETICCTPCT in the last 72 hours.  Coagulation profile  Recent Labs Lab 01/25/16 1905 01/26/16 0705  INR 2.44* 2.90*    No results for input(s): DDIMER in the last 72 hours.  Cardiac Enzymes  Recent Labs Lab 01/25/16 1905  TROPONINI <0.03   ------------------------------------------------------------------------------------------------------------------ Invalid input(s): POCBNP  No results for input(s): GLUCAP in the last 72 hours.   Chelle Cayton M.D. [facility] 01/26/2016, 10:27 AM  Pager: [phone] Between 7am to 7pm - call Pager - [phone]  After 7pm go to [email or web address] - password [email or web address]  Call night coverage person covering after 7pm

## 2016-01-26 NOTE — Care Management Obs Status (Signed)
MEDICARE OBSERVATION STATUS NOTIFICATION   Patient Details  Name: Angel Torres MRN: [id number] Date of Birth: 1946/07/27   Medicare Observation Status Notification Given:  Yes CM witnessed family at bedside sign form for patient.     Fuller PlanWelborn, Calvyn Kurtzman M, RN 01/26/2016, 4:42 PM

## 2016-01-26 NOTE — Progress Notes (Signed)
ANTICOAGULATION CONSULT NOTE - Initial Consult  Pharmacy Consult for Coumadin Indication: atrial fibrillation  No Known Allergies  Patient Measurements: Height:  (162.6 cm) Weight: 143 lb 15.4 oz (65.3 kg) IBW/kg (Calculated) : 54.7 Heparin Dosing Weight:   Vital Signs: Temp: 97.7 F (36.5 C) (03/05 0748) Temp Source: Oral (03/05 0748) BP: 118/53 mmHg (03/05 0748) Pulse Rate: 56 (03/05 0545)  Labs:  Recent Labs  01/24/16 0801 01/25/16 1905 01/26/16 0705  HGB  --  13.2  --   HCT  --  39.4  --   PLT  --  164  --   LABPROT  --  26.2* 29.8*  INR  --  2.44* 2.90*  CREATININE 0.48* 0.53  --   TROPONINI  --  <0.03  --     Estimated Creatinine Clearance: 56.5 mL/min (by C-G formula based on Cr of 0.53).   Medical History: Past Medical History  Diagnosis Date  . Down syndrome   . Essential hypertension   . Systolic dysfunction     LVEF 40%, indeterminate diastolic function  . Chronic atrial fibrillation (HCC)   . Pneumonia   . Pulmonary embolism (HCC)   . Leg fracture   . Pulmonary hypertension (HCC)     Moderate  . Right ventricular dysfunction     Medications:  Prescriptions prior to admission  Medication Sig Dispense Refill Last Dose  . alendronate (FOSAMAX) 70 MG tablet Take 70 mg by mouth once a week. Takes on Mondays. Take with a full glass of water on an empty stomach.   Past Week at Unknown time  . betamethasone dipropionate (DIPROLENE) 0.05 % cream Apply 1 application topically 2 (two) times daily as needed (for itching).   unknown  . Calcium Carbonate (CALCIUM 600 PO) Take 600 mg by mouth daily.   01/25/2016 at 800a  . digoxin (LANOXIN) 0.25 MG tablet Take 250 mcg by mouth daily.   01/24/2016 at 800a  . diltiazem (CARDIZEM) 60 MG tablet Take 1 tablet (60 mg total) by mouth 3 (three) times daily. 90 tablet 3 01/24/2016 at 1400  . furosemide (LASIX) 40 MG tablet Take 80 mg in the morning and 40 mg in the evening (Patient taking differently: Take 40-80 mg  by mouth 2 (two) times daily. Take 80 mg in the morning and 40 mg in the evening) 90 tablet 3 01/24/2016 at Unknown time  . glipiZIDE (GLUCOTROL) 5 MG tablet Take 1 tablet (5 mg total) by mouth 2 (two) times daily before a meal. HOLD IF BLOOD SUGAR IS LESS THAN 115.   01/24/2016 at 800  . lisinopril (PRINIVIL,ZESTRIL) 5 MG tablet Take 5 mg by mouth daily.   01/24/2016 at 800a  . metFORMIN (GLUCOPHAGE-XR) 500 MG 24 hr tablet Take 1 tablet (500 mg total) by mouth daily. DO NOT START UNTIL 12/28/2015.   01/24/2016 at 800a  . metoprolol tartrate (LOPRESSOR) 25 MG tablet Take 25 mg by mouth 2 (two) times daily.   01/23/2016 at 800  . triamcinolone cream (KENALOG) 0.1 % Apply 1 application topically 2 (two) times daily.   01/25/2016 at 800a  . Vitamin D, Cholecalciferol, 400 UNITS TABS Take 400 Units by mouth daily.   01/25/2016 at 800a  . warfarin (COUMADIN) 2 MG tablet Take 1-2 tablets by mouth See admin instructions. Take 1 tablet by mouth on Tuesday, Thursday and Saturday.  Take (1/2) tablet by mouth on Sunday, Monday, Wednesday, and Friday.   01/25/2016 at 1700  . warfarin (COUMADIN) 6 MG tablet  Take 6 mg by mouth every evening.   01/24/2016 at 1700    Assessment: 70 yo female admitted N/V, slurred speech, difficulty ambulating. Admitted for TIA workup Continuation of coumadin PTA for AFIB INR 2.44 on admission INR 2.9 this AM, Coumadin given before admission according to PTA mediation list, none last evening after admission   Goal of Therapy:  INR 2-3 Monitor platelets by anticoagulation protocol: Yes   Plan:  Coumadin 4 mg x 1 dose today, reduced dose from PTA dosing (7 mg) due to INR rising sharply. INR/PT daily   Raquel Jamesittman, Masako Overall Bennett 01/26/2016,9:23 AM

## 2016-01-27 ENCOUNTER — Observation Stay (HOSPITAL_COMMUNITY): Payer: Medicare Other

## 2016-01-27 ENCOUNTER — Other Ambulatory Visit: Payer: Self-pay

## 2016-01-27 DIAGNOSIS — S8252XA Displaced fracture of medial malleolus of left tibia, initial encounter for closed fracture: Secondary | ICD-10-CM | POA: Diagnosis not present

## 2016-01-27 DIAGNOSIS — I5041 Acute combined systolic (congestive) and diastolic (congestive) heart failure: Secondary | ICD-10-CM | POA: Diagnosis not present

## 2016-01-27 DIAGNOSIS — Z79899 Other long term (current) drug therapy: Secondary | ICD-10-CM

## 2016-01-27 DIAGNOSIS — I671 Cerebral aneurysm, nonruptured: Secondary | ICD-10-CM | POA: Diagnosis not present

## 2016-01-27 DIAGNOSIS — R471 Dysarthria and anarthria: Secondary | ICD-10-CM | POA: Diagnosis not present

## 2016-01-27 DIAGNOSIS — R569 Unspecified convulsions: Secondary | ICD-10-CM | POA: Diagnosis not present

## 2016-01-27 DIAGNOSIS — I272 Other secondary pulmonary hypertension: Secondary | ICD-10-CM

## 2016-01-27 DIAGNOSIS — I509 Heart failure, unspecified: Secondary | ICD-10-CM | POA: Diagnosis not present

## 2016-01-27 DIAGNOSIS — R4182 Altered mental status, unspecified: Secondary | ICD-10-CM | POA: Diagnosis not present

## 2016-01-27 DIAGNOSIS — R110 Nausea: Secondary | ICD-10-CM | POA: Diagnosis not present

## 2016-01-27 DIAGNOSIS — S82892A Other fracture of left lower leg, initial encounter for closed fracture: Secondary | ICD-10-CM | POA: Diagnosis not present

## 2016-01-27 DIAGNOSIS — G459 Transient cerebral ischemic attack, unspecified: Secondary | ICD-10-CM | POA: Diagnosis not present

## 2016-01-27 DIAGNOSIS — R112 Nausea with vomiting, unspecified: Secondary | ICD-10-CM | POA: Diagnosis not present

## 2016-01-27 DIAGNOSIS — I4891 Unspecified atrial fibrillation: Secondary | ICD-10-CM | POA: Diagnosis not present

## 2016-01-27 LAB — BASIC METABOLIC PANEL
ANION GAP: 8 (ref 5–15)
BUN: 17 mg/dL (ref 6–20)
CO2: 28 mmol/L (ref 22–32)
Calcium: 8.4 mg/dL — ABNORMAL LOW (ref 8.9–10.3)
Chloride: 99 mmol/L — ABNORMAL LOW (ref 101–111)
Creatinine, Ser: 0.47 mg/dL (ref 0.44–1.00)
GLUCOSE: 129 mg/dL — AB (ref 65–99)
POTASSIUM: 3.8 mmol/L (ref 3.5–5.1)
Sodium: 135 mmol/L (ref 135–145)

## 2016-01-27 LAB — CBC
HCT: 38.5 % (ref 36.0–46.0)
Hemoglobin: 12.6 g/dL (ref 12.0–15.0)
MCH: 28.6 pg (ref 26.0–34.0)
MCHC: 32.7 g/dL (ref 30.0–36.0)
MCV: 87.3 fL (ref 78.0–100.0)
PLATELETS: 179 10*3/uL (ref 150–400)
RBC: 4.41 MIL/uL (ref 3.87–5.11)
RDW: 14.6 % (ref 11.5–15.5)
WBC: 9.3 10*3/uL (ref 4.0–10.5)

## 2016-01-27 LAB — HEMOGLOBIN A1C
Hgb A1c MFr Bld: 7.1 % — ABNORMAL HIGH (ref 4.8–5.6)
MEAN PLASMA GLUCOSE: 157 mg/dL

## 2016-01-27 LAB — PROTIME-INR
INR: 3.21 — AB (ref 0.00–1.49)
Prothrombin Time: 32.2 seconds — ABNORMAL HIGH (ref 11.6–15.2)

## 2016-01-27 NOTE — Progress Notes (Signed)
[facility]                                                                              Patient Demographics  Angel Torres, is a 70 y.o. female, DOB - [birth year], ZOX:[id number]  Admit date - 01/25/2016   Admitting Physician Houston Siren, MD  Outpatient Primary MD for the patient is [facility]  LOS -    Chief Complaint  Patient presents with  . Emesis       Brief HPI   Angel Torres is an 70 y.o. female with a hx of HTN, atrial fibrillation, and diastolic CHF that presents from [facility] with vomiting. Per family members, patient was eating dinner when she stated that she had a headache and began vomiting. EMS gave IV Zofran with improvement however she still reports not feeling well. Unclear if the patient is having abdominal pain. Family members at bedside believe that the patient's speech is slurred and state that she has been unable to ambulate at her baseline today. They also state that she has been complaining of left ankle pain and swelling recently. They are unable to give specific last known well time. While in the ED, vital signs were stable. Labs revelead a unremarkable BMP, negative troponin and lactic acid, BNP 432, and WBC 12.8. EKG, CXR, and AAS were unremarkable and head CT did not show any acute changes. Hospitalist was asked to admit for TIA workup.    Assessment & Plan    Principal Problem:  Vertigo rule out TIA (transient ischemic attack)/posterior circulation CVA: Per brother at the bedside, patient was complaining of vertigo and he noticed slurred speech  - - Follow MRI, MRA brain - 2-D echo showed EF of 35-40% and grade 2 diastolic dysfunction, diffuse hypokinesis - Carotid Doppler showed mild bilateral carotid bifurcation plaque less than 50% diameter stenosis - Hold off on the PTOT until orthopedics evaluation - Patient passed swallow evaluation - LDL 45 , hemoglobin A1c 7.1 - Continue aspirin  - Placed on  meclizine, antiemetics  - Neurology consulted  Active Problems:   Chronic atrial fibrillation (HCC) - Currently rate controlled, continue Cardizem, - [place] vasc 4 Continue warfarin,    Chronic Biventricular heart failure (HCC) -Currently compensated, discontinue IV fluids to avoid fluid overload, patient tolerating diet without any difficulty. - 2-D echo with EF of 35-40%, grade 2 diastolic dysfunction, diffuse hypokinesis - Currently euvolemic, continue Lasix 40mg  daily to avoid hypotension ( takes 80 mg in a.m. and 40 mg in p.m.)    Type 2 diabetes mellitus (HCC) - Hemoglobin A1c 7.1 - Placed on sliding scale insulin  Left ankle fracture - X-ray showed minimally displaced fracture of the medial malleolus. Orthopedics consulted, awaiting recommendations. - No PT/ OT evaluation until orthopedics evaluation.  Code Status: full code  Family Communication: Discussed in detail with the patient, all imaging results, lab results explained to the patient and brother at the bedside   Disposition Plan:   Time Spent in minutes  25 minutes  Procedures  CT head Carotid Dopplers, 2-D echo  Consults   Orthopedics  Neurology  DVT Prophylaxis warfarin  Medications  Scheduled Meds: . aspirin  300 mg Rectal Daily   Or  . aspirin  325 mg Oral Daily  . calcium carbonate  1 tablet Oral Q breakfast  . digoxin  250 mcg Oral Daily  . diltiazem  60 mg Oral TID  . furosemide  40 mg Oral Daily  . lisinopril  5 mg Oral Daily  . meclizine  12.5 mg Oral TID  . metoprolol tartrate  25 mg Oral BID  . Warfarin - Pharmacist Dosing Inpatient   Does not apply Q24H   Continuous Infusions:  PRN Meds:.oxyCODONE-acetaminophen, senna-docusate   Antibiotics   Anti-infectives    None        Subjective:   Angel Torres was seen and examined today. Dizziness is improving. Slurred speech improving. Patient goes to baseline per brother at the bedside. Tolerating diet without any difficulty.  States left foot hurts. Patient denies chest pain, shortness of breath, abdominal pain, N/V/D/C, new weakness, numbess, tingling. No acute events overnight.    Objective:   Filed Vitals:   01/26/16 2208 01/26/16 2345 01/27/16 0345 01/27/16 1023  BP:  117/44 116/59 123/58  Pulse: 71 59 65 77  Temp:  98.9 F (37.2 C) 98.6 F (37 C)   TempSrc:  Oral Oral   Resp:  20 20   Height:      Weight:      SpO2:  99% 97%     Intake/Output Summary (Last 24 hours) at 01/27/16 1228 Last data filed at 01/26/16 1910  Gross per 24 hour  Intake    720 ml  Output    200 ml  Net    520 ml     Wt Readings from Last 3 Encounters:  01/26/16 65.3 kg (143 lb 15.4 oz)  01/15/16 66.679 kg (147 lb)  01/01/16 62.596 kg (138 lb)     Exam  General: Alert and oriented x 3, NAD  HEENT:    Neck:   CVS: S1 S2clear, RRR  Respiratory: CTAB, no wheezing or rales  Abdomen: Soft, nontender, nondistended, + bowel sounds  Ext: no cyanosis clubbing or edema, left ankle swollen and tenderness , currently in splint   Neuro: able to lift up both lower extremities bilaterally  Skin: No rashes  Psych: Normal affect and demeanor, alert and oriented x3    Data Review   Micro Results Recent Results (from the past 240 hour(s))  MRSA PCR Screening     Status: None   Collection Time: 01/26/16  2:22 AM  Result Value Ref Range Status   MRSA by PCR NEGATIVE NEGATIVE Final    Comment:        The GeneXpert MRSA Assay (FDA approved for NASAL specimens only), is one component of a comprehensive MRSA colonization surveillance program. It is not intended to diagnose MRSA infection nor to guide or monitor treatment for MRSA infections.     Radiology Reports Dg Ankle Complete Left  01/26/2016  CLINICAL DATA:  70 year old female with left ankle pain and swelling and redness. EXAM: LEFT ANKLE COMPLETE - 3+ VIEW COMPARISON:  Radiograph dated 07/16/2008 FINDINGS: There is a minimally displaced fracture of  the medial malleolus. No other acute fracture identified. The bones are osteopenic which limits evaluation for fracture. There is mild narrowing of the medial aspect of the ankle mortise. There is diffuse subcutaneous edema with diffuse speckled skin calcification, likely related to chronic venous insufficiency. No radiopaque foreign object identified. IMPRESSION: Minimally displaced fracture of the medial malleolus. CT  may provide better evaluation. Electronically Signed   By: Elgie Collard M.D.   On: 01/26/2016 01:37   Ct Head Wo Contrast  01/25/2016  CLINICAL DATA:  Slurred speech. EXAM: CT HEAD WITHOUT CONTRAST TECHNIQUE: Contiguous axial images were obtained from the base of the skull through the vertex without intravenous contrast. COMPARISON:  None FINDINGS: There is patchy low attenuation within the subcortical white matter bilaterally compatible with chronic microvascular disease. There is no abnormal extra-axial fluid collection, intracranial hemorrhage or mass identified. No evidence for acute brain infarct. There is mild scratch set bilateral maxillary sinus disease identified. There is opacification of the left mastoid air cells. The right mastoid air cells appear underdeveloped. The calvarium is intact. IMPRESSION: 1. Small vessel ischemic disease. 2. No acute intracranial abnormalities noted. 3. Left mastoid air cell opacification. Mild bilateral maxillary sinus disease also noted. Electronically Signed   By: Signa Kell M.D.   On: 01/25/2016 23:15   Mr Shirlee Latch Wo Contrast  01/27/2016  CLINICAL DATA:  70 year old female with altered mental status, slurred speech, and weakness for 2 days. Initial encounter. EXAM: MRA HEAD WITHOUT CONTRAST TECHNIQUE: Angiographic images of the Circle of Willis were obtained using MRA technique without intravenous contrast. COMPARISON:  Brain MRI from today reported separately. Head CT without contrast 01/25/2016. FINDINGS: Antegrade flow in the posterior  circulation with dominant appearing distal right vertebral artery. No distal vertebral artery stenosis. Patent vertebrobasilar junction. Mildly tortuous basilar artery without stenosis. Normal SCA and PCA origins. Normal right posterior communicating artery, the left is diminutive or absent. Bilateral PCA branches are within normal limits. Antegrade flow in both ICA siphons. ICA tortuosity without stenosis. The right ophthalmic and posterior communicating artery origins are within normal limits. Just distal to the left ophthalmic artery origin arising in the supraclinoid ICA and directed superiorly there is a 3 mm saccular aneurysm (series 1, image 72 and series 109, image 35). Otherwise normal distal ICAs. Patent carotid termini. Mildly dominant right ACA A1 segment. MCA and ACA origins within normal limits. Anterior communicating artery and proximal ACA branches within normal limits. Mild motion artifact affecting the distal ACA branches. MCA M1 segments and bifurcations are within normal limits. Motion artifact affecting the visualized bilateral MCA branches. IMPRESSION: 1. 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 Recommend Neuro-endovascular follow-up. 2. No intracranial stenosis stenosis. Mild intracranial tortuosity and otherwise negative for age intracranial MRA. Electronically Signed   By: Odessa Fleming M.D.   On: 01/27/2016 08:52   Mr Brain Wo Contrast  01/27/2016  CLINICAL DATA:  70 year old female with altered mental status, slurred speech, and weakness for 2 days. Initial encounter. EXAM: MRI HEAD WITHOUT CONTRAST TECHNIQUE: Multiplanar, multiecho pulse sequences of the brain and surrounding structures were obtained without intravenous contrast. COMPARISON:  Head CT without contrast 01/25/2016 FINDINGS: Study is intermittently degraded by motion artifact despite repeated imaging attempts. No restricted diffusion or evidence of acute infarction. Major intracranial vascular flow voids are preserved. Cerebral  volume is within normal limits for age. No midline shift. No ventriculomegaly. No acute intracranial hemorrhage identified. Negative pituitary and cervicomedullary junction. Evidence of a small right lateral convexity extra-axial mass encompassing 16 x 8 x 15 mm (AP by transverse by CC). This is most parent on diffusion-weighted imaging but also is visible on axial FLAIR and T1 imaging being nearly isointense to gray matter. The lesion appears mineralized on T2 * imaging (series 9, image 16) and was mildly hyperdense on the comparison CT. No other intracranial mass  lesion identified. Bilateral scattered, patchy, and confluent nonspecific cerebral white matter T2 and FLAIR hyperintensity. No cortical encephalomalacia. Deep gray matter nuclei, brainstem, and cerebellum are within normal limits. Left mastoid effusion. Nasopharynx within normal limits. The left tympanic cavity min may also be partially opacified. Mild right maxillary sinus mucosal thickening. Grossly negative orbit and scalp soft tissues. 3.4 cm left parietal bone lesion with T2 hyperintensity (series 6, image 16, FLAIR hyperintensity, and mild intrinsic increased T1 signal (series 6, image 64). This has conspicuous increased diffusion signal. This appeared non expansile and circumscribed on the recent CT. Elsewhere bone marrow signal appears within normal limits. Partially visible degenerative cervical spine changes with mild to moderate degenerative spinal stenosis suspected at C3-C4. IMPRESSION: 1.  No acute intracranial abnormality. 2. Right lateral convexity 1.6 cm meningioma suspected, unlikely clinically silent. 3. Moderate for age nonspecific white matter signal changes, most commonly due to chronic small vessel disease. 4. Left mastoid effusion with partial opacification of the left tympanic cavity, consider inflammatory or cholesteatoma etiology. 5. Degenerative cervical spinal stenosis at C3-C4. 6. Benign-appearing left parietal bone lesion,  favor hemangioma. Electronically Signed   By: Odessa Fleming M.D.   On: 01/27/2016 08:47   US Carotid Bilateral  01/26/2016  CLINICAL DATA:  TIA.  Down syndrome. EXAM: BILATERAL CAROTID DUPLEX ULTRASOUND TECHNIQUE: Wallace Cullens scale imaging, color Doppler and duplex ultrasound was performed of bilateral carotid and vertebral arteries in the neck. COMPARISON:  None available REVIEW OF SYSTEMS: Quantification of carotid stenosis is based on velocity parameters that correlate the residual internal carotid diameter with NASCET-based stenosis levels, using the diameter of the distal internal carotid lumen as the denominator for stenosis measurement. The following velocity measurements were obtained: PEAK SYSTOLIC/END DIASTOLIC RIGHT ICA:                     78/24cm/sec CCA:                     76/11cm/sec SYSTOLIC ICA/CCA RATIO:  1.1 DIASTOLIC ICA/CCA RATIO: 2.2 ECA:                     116cm/sec LEFT ICA:                     118/26cm/sec CCA:                     86/13cm/sec SYSTOLIC ICA/CCA RATIO:  1.4 DIASTOLIC ICA/CCA RATIO: 2.0 ECA:                     135cm/sec FINDINGS: RIGHT CAROTID ARTERY: Mild plaque at the bifurcation. No high-grade stenosis. Normal waveforms and color Doppler signal. RIGHT VERTEBRAL ARTERY: Normal flow direction and waveform. A nonspecific cardiac arrhythmia is noted. LEFT CAROTID ARTERY: Mild smooth noncalcified plaque in the carotid bulb extending into proximal internal and external carotid arteries. No high-grade stenosis. Normal waveforms and color Doppler signal. LEFT VERTEBRAL ARTERY: Normal flow direction and waveform. IMPRESSION: 1. Mild bilateral carotid bifurcation plaque resulting in less than 50% diameter stenosis. The exam does not exclude plaque ulceration or embolization. Continued surveillance recommended. 2. Nonspecific cardiac arrhythmia noted. Electronically Signed   By: Corlis Leak M.D.   On: 01/26/2016 10:18   Dg Tibia/fibula Left Port  01/27/2016  CLINICAL DATA:  Ankle fracture  EXAM: PORTABLE LEFT TIBIA AND FIBULA - 2 VIEW COMPARISON:  None. FINDINGS: Mild diffuse osteopenia. Age in the determinate deformity involving the  ankle is identified. Due to patient positioning exact details of the deformity is uncertain. Recommend further investigation with CT of the ankle. Chronic lateral tibial plateau fracture noted. IMPRESSION: 1. Age-indeterminate deformity involving the right ankle. Recommend further investigation with CT of the ankle. 2. Chronic lateral tibial plateau fracture. Electronically Signed   By: Signa Kell M.D.   On: 01/27/2016 09:31   Dg Abd Acute W/chest  01/25/2016  CLINICAL DATA:  Nausea and vomiting beginning today. Atrial fibrillation. Systolic dysfunction. Hypertension. EXAM: DG ABDOMEN ACUTE W/ 1V CHEST COMPARISON:  12/25/2015 FINDINGS: There is no evidence of dilated bowel loops or free intraperitoneal air. No radiopaque calculi or other significant radiographic abnormality is seen. Moderate cardiomegaly stable. Small right pleural effusion and bibasilar atelectasis show no significant change. IMPRESSION: Unremarkable bowel gas pattern. Stable cardiomegaly, small right pleural effusion, and bibasilar atelectasis. Electronically Signed   By: Myles Rosenthal M.D.   On: 01/25/2016 21:00   Mm Digital Screening Bilateral  01/17/2016  CLINICAL DATA:  Screening. EXAM: DIGITAL SCREENING BILATERAL MAMMOGRAM WITH CAD COMPARISON:  Previous exam(s). ACR Breast Density Category b: There are scattered areas of fibroglandular density. FINDINGS: In the right breast, calcifications warrant further evaluation with magnified views. In the left breast, no findings suspicious for malignancy. Images were processed with CAD. IMPRESSION: Further evaluation is suggested for calcifications in the right breast. RECOMMENDATION: Diagnostic mammogram of the right breast. (Code:[id number]-11M) The patient will be contacted regarding the findings, and additional imaging will be scheduled. BI-RADS  CATEGORY  0: Incomplete. Need additional imaging evaluation and/or prior mammograms for comparison. Electronically Signed   By: Sherian Rein M.D.   On: 01/17/2016 13:11    CBC  Recent Labs Lab 01/25/16 1905 01/27/16 0710  WBC 12.8* 9.3  HGB 13.2 12.6  HCT 39.4 38.5  PLT 164 179  MCV 86.0 87.3  MCH 28.8 28.6  MCHC 33.5 32.7  RDW 14.4 14.6  LYMPHSABS 0.9  --   MONOABS 0.7  --   EOSABS 0.1  --   BASOSABS 0.0  --     Chemistries   Recent Labs Lab 01/24/16 0801 01/25/16 1905 01/25/16 2217 01/27/16 0710  NA 134* 132*  --  135  K 3.5 3.4*  --  3.8  CL 95* 95*  --  99*  CO2 23 25  --  28  GLUCOSE 117* 205*  --  129*  BUN 13 16  --  17  CREATININE 0.48* 0.53  --  0.47  CALCIUM 9.1 8.5*  --  8.4*  MG  --   --  1.7  --   AST  --  24  --   --   ALT  --  16  --   --   ALKPHOS  --  116  --   --   BILITOT  --  1.3*  --   --    ------------------------------------------------------------------------------------------------------------------ estimated creatinine clearance is 56.5 mL/min (by C-G formula based on Cr of 0.47). ------------------------------------------------------------------------------------------------------------------  Recent Labs  01/26/16 0705  HGBA1C 7.1*   ------------------------------------------------------------------------------------------------------------------  Recent Labs  01/26/16 0705  CHOL 80  HDL 24*  LDLCALC 45  TRIG 56  CHOLHDL 3.3   ------------------------------------------------------------------------------------------------------------------  Recent Labs  01/26/16 0705  TSH 0.503   ------------------------------------------------------------------------------------------------------------------  Recent Labs  01/26/16 0705  VITAMINB12 426  FOLATE 13.1    Coagulation profile  Recent Labs Lab 01/25/16 1905 01/26/16 0705 01/27/16 0710  INR 2.44* 2.90* 3.21*    No results for input(s): DDIMER  in the last  72 hours.  Cardiac Enzymes  Recent Labs Lab 01/25/16 1905  TROPONINI <0.03   ------------------------------------------------------------------------------------------------------------------ Invalid input(s): POCBNP  No results for input(s): GLUCAP in the last 72 hours.   RAI,RIPUDEEP M.D. [facility] 01/27/2016, 12:28 PM  Pager: [phone] Between 7am to 7pm - call Pager - [phone]  After 7pm go to [email or web address] - password [email or web address]  Call night coverage person covering after 7pm

## 2016-01-27 NOTE — Consult Note (Signed)
Reason for Consult: Left ankle fracture Referring Physician: Tana Coast, md  Angel Torres is an 70 y.o. female.  HPI: 70 year old female admitted for medical problems also presents with a unwitnessed injury to her left ankle which she had for several days at a nursing home and on presentation to the hospital eventually had x-rays which showed a medial malleolus fracture. The x-rays of the ankle did not include the tibia and fibula proximally so another x-ray was obtained that was normal she has an old tibial plateau fracture.  She has pain swelling over the medial malleolus of the left ankle with constant pain there which is mild. She is thought to have Down syndrome but this has not been documented.  Past Medical History  Diagnosis Date  . Down syndrome   . Essential hypertension   . Systolic dysfunction     LVEF 48%, indeterminate diastolic function  . Chronic atrial fibrillation ([place])   . Pneumonia   . Pulmonary embolism ([place])   . Leg fracture   . Pulmonary hypertension ([facility])     Moderate  . Right ventricular dysfunction     Past Surgical History  Procedure Laterality Date  . Thoracentesis Right 12/09/2015    Family History  Problem Relation Age of Onset  . Arrhythmia Father   . Arrhythmia Brother     Social History:  reports that she has never smoked. She has never used smokeless tobacco. She reports that she does not drink alcohol or use illicit drugs.  Allergies: No Known Allergies  Medications: I have reviewed the patient's current medications.  Results for orders placed or performed during the hospital encounter of 01/25/16 (from the past 48 hour(s))  Comprehensive metabolic panel     Status: Abnormal   Collection Time: 01/25/16  7:05 PM  Result Value Ref Range   Sodium 132 (L) 135 - 145 mmol/L   Potassium 3.4 (L) 3.5 - 5.1 mmol/L   Chloride 95 (L) 101 - 111 mmol/L   CO2 25 22 - 32 mmol/L   Glucose, Bld 205 (H) 65 - 99 mg/dL   BUN 16 6 - 20 mg/dL   Creatinine,  Ser 0.53 0.44 - 1.00 mg/dL   Calcium 8.5 (L) 8.9 - 10.3 mg/dL   Total Protein 7.7 6.5 - 8.1 g/dL   Albumin 3.9 3.5 - 5.0 g/dL   AST 24 15 - 41 U/L   ALT 16 14 - 54 U/L   Alkaline Phosphatase 116 38 - 126 U/L   Total Bilirubin 1.3 (H) 0.3 - 1.2 mg/dL   GFR calc non Af Amer >60 >60 mL/min   GFR calc Af Amer >60 >60 mL/min    Comment: (NOTE) The eGFR has been calculated using the CKD EPI equation. This calculation has not been validated in all clinical situations. eGFR's persistently <60 mL/min signify possible Chronic Kidney Disease.    Anion gap 12 5 - 15  Lipase, blood     Status: None   Collection Time: 01/25/16  7:05 PM  Result Value Ref Range   Lipase 29 11 - 51 U/L  Troponin I     Status: None   Collection Time: 01/25/16  7:05 PM  Result Value Ref Range   Troponin I <0.03 <0.031 ng/mL    Comment:        NO INDICATION OF MYOCARDIAL INJURY.   Lactic acid, plasma     Status: None   Collection Time: 01/25/16  7:05 PM  Result Value Ref Range   Lactic  Acid, Venous 1.8 0.5 - 2.0 mmol/L  CBC with Differential     Status: Abnormal   Collection Time: 01/25/16  7:05 PM  Result Value Ref Range   WBC 12.8 (H) 4.0 - 10.5 K/uL   RBC 4.58 3.87 - 5.11 MIL/uL   Hemoglobin 13.2 12.0 - 15.0 g/dL   HCT 39.4 36.0 - 46.0 %   MCV 86.0 78.0 - 100.0 fL   MCH 28.8 26.0 - 34.0 pg   MCHC 33.5 30.0 - 36.0 g/dL   RDW 14.4 11.5 - 15.5 %   Platelets 164 150 - 400 K/uL   Neutrophils Relative % 86 %   Neutro Abs 10.9 (H) 1.7 - 7.7 K/uL   Lymphocytes Relative 7 %   Lymphs Abs 0.9 0.7 - 4.0 K/uL   Monocytes Relative 6 %   Monocytes Absolute 0.7 0.1 - 1.0 K/uL   Eosinophils Relative 1 %   Eosinophils Absolute 0.1 0.0 - 0.7 K/uL   Basophils Relative 0 %   Basophils Absolute 0.0 0.0 - 0.1 K/uL  Protime-INR     Status: Abnormal   Collection Time: 01/25/16  7:05 PM  Result Value Ref Range   Prothrombin Time 26.2 (H) 11.6 - 15.2 seconds   INR 2.44 (H) 0.00 - 1.49  Digoxin level     Status: None    Collection Time: 01/25/16  7:05 PM  Result Value Ref Range   Digoxin Level 1.0 0.8 - 2.0 ng/mL  Urinalysis, Routine w reflex microscopic     Status: Abnormal   Collection Time: 01/25/16  8:41 PM  Result Value Ref Range   Color, Urine YELLOW YELLOW   APPearance CLEAR CLEAR   Specific Gravity, Urine 1.020 1.005 - 1.030   pH 5.0 5.0 - 8.0   Glucose, UA NEGATIVE NEGATIVE mg/dL   Hgb urine dipstick TRACE (A) NEGATIVE   Bilirubin Urine NEGATIVE NEGATIVE   Ketones, ur 15 (A) NEGATIVE mg/dL   Protein, ur 100 (A) NEGATIVE mg/dL   Nitrite NEGATIVE NEGATIVE   Leukocytes, UA NEGATIVE NEGATIVE  Urine microscopic-add on     Status: Abnormal   Collection Time: 01/25/16  8:41 PM  Result Value Ref Range   Squamous Epithelial / LPF 0-5 (A) NONE SEEN   WBC, UA 0-5 0 - 5 WBC/hpf   RBC / HPF 0-5 0 - 5 RBC/hpf   Bacteria, UA MANY (A) NONE SEEN  Lactic acid, plasma     Status: None   Collection Time: 01/25/16 10:17 PM  Result Value Ref Range   Lactic Acid, Venous 1.5 0.5 - 2.0 mmol/L  Magnesium     Status: None   Collection Time: 01/25/16 10:17 PM  Result Value Ref Range   Magnesium 1.7 1.7 - 2.4 mg/dL  Brain natriuretic peptide     Status: Abnormal   Collection Time: 01/25/16 10:20 PM  Result Value Ref Range   B Natriuretic Peptide 432.0 (H) 0.0 - 100.0 pg/mL  MRSA PCR Screening     Status: None   Collection Time: 01/26/16  2:22 AM  Result Value Ref Range   MRSA by PCR NEGATIVE NEGATIVE    Comment:        The GeneXpert MRSA Assay (FDA approved for NASAL specimens only), is one component of a comprehensive MRSA colonization surveillance program. It is not intended to diagnose MRSA infection nor to guide or monitor treatment for MRSA infections.   Hemoglobin A1c     Status: Abnormal   Collection Time: 01/26/16  7:05 AM  Result Value Ref Range   Hgb A1c MFr Bld 7.1 (H) 4.8 - 5.6 %    Comment: (NOTE)         Pre-diabetes: 5.7 - 6.4         Diabetes: >6.4         Glycemic control  for adults with diabetes: <7.0    Mean Plasma Glucose 157 mg/dL    Comment: (NOTE) Performed At: [facility] [place], [place] [id number] Lindon Romp MD [id number]   Lipid panel     Status: Abnormal   Collection Time: 01/26/16  7:05 AM  Result Value Ref Range   Cholesterol 80 0 - 200 mg/dL   Triglycerides 56 <150 mg/dL   HDL 24 (L) >40 mg/dL   Total CHOL/HDL Ratio 3.3 RATIO   VLDL 11 0 - 40 mg/dL   LDL Cholesterol 45 0 - 99 mg/dL    Comment:        Total Cholesterol/HDL:CHD Risk Coronary Heart Disease Risk Table                     Men   Women  1/2 Average Risk   3.4   3.3  Average Risk       5.0   4.4  2 X Average Risk   9.6   7.1  3 X Average Risk  23.4   11.0        Use the calculated Patient Ratio above and the CHD Risk Table to determine the patient's CHD Risk.        ATP III CLASSIFICATION (LDL):  <100     mg/dL   Optimal  100-129  mg/dL   Near or Above                    Optimal  130-159  mg/dL   Borderline  160-189  mg/dL   High  >190     mg/dL   Very High   Protime-INR     Status: Abnormal   Collection Time: 01/26/16  7:05 AM  Result Value Ref Range   Prothrombin Time 29.8 (H) 11.6 - 15.2 seconds   INR 2.90 (H) 0.00 - 1.49  Vitamin B12     Status: None   Collection Time: 01/26/16  7:05 AM  Result Value Ref Range   Vitamin B-12 426 180 - 914 pg/mL    Comment: (NOTE) This assay is not validated for testing neonatal or myeloproliferative syndrome specimens for Vitamin B12 levels. Performed at [facility]   Folate     Status: None   Collection Time: 01/26/16  7:05 AM  Result Value Ref Range   Folate 13.1 >5.9 ng/mL    Comment: Performed at [facility]  TSH     Status: None   Collection Time: 01/26/16  7:05 AM  Result Value Ref Range   TSH 0.503 0.350 - 4.500 uIU/mL  Protime-INR     Status: Abnormal   Collection Time: 01/27/16  7:10 AM  Result Value Ref Range   Prothrombin Time 32.2 (H) 11.6 - 15.2  seconds   INR 3.21 (H) 0.00 - 1.49  CBC     Status: None   Collection Time: 01/27/16  7:10 AM  Result Value Ref Range   WBC 9.3 4.0 - 10.5 K/uL   RBC 4.41 3.87 - 5.11 MIL/uL   Hemoglobin 12.6 12.0 - 15.0 g/dL   HCT 38.5 36.0 - 46.0 %  MCV 87.3 78.0 - 100.0 fL   MCH 28.6 26.0 - 34.0 pg   MCHC 32.7 30.0 - 36.0 g/dL   RDW 14.6 11.5 - 15.5 %   Platelets 179 150 - 400 K/uL  Basic metabolic panel     Status: Abnormal   Collection Time: 01/27/16  7:10 AM  Result Value Ref Range   Sodium 135 135 - 145 mmol/L   Potassium 3.8 3.5 - 5.1 mmol/L   Chloride 99 (L) 101 - 111 mmol/L   CO2 28 22 - 32 mmol/L   Glucose, Bld 129 (H) 65 - 99 mg/dL   BUN 17 6 - 20 mg/dL   Creatinine, Ser 0.47 0.44 - 1.00 mg/dL   Calcium 8.4 (L) 8.9 - 10.3 mg/dL   GFR calc non Af Amer >60 >60 mL/min   GFR calc Af Amer >60 >60 mL/min    Comment: (NOTE) The eGFR has been calculated using the CKD EPI equation. This calculation has not been validated in all clinical situations. eGFR's persistently <60 mL/min signify possible Chronic Kidney Disease.    Anion gap 8 5 - 15    Dg Ankle Complete Left  01/26/2016  CLINICAL DATA:  70 year old female with left ankle pain and swelling and redness. EXAM: LEFT ANKLE COMPLETE - 3+ VIEW COMPARISON:  Radiograph dated 07/16/2008 FINDINGS: There is a minimally displaced fracture of the medial malleolus. No other acute fracture identified. The bones are osteopenic which limits evaluation for fracture. There is mild narrowing of the medial aspect of the ankle mortise. There is diffuse subcutaneous edema with diffuse speckled skin calcification, likely related to chronic venous insufficiency. No radiopaque foreign object identified. IMPRESSION: Minimally displaced fracture of the medial malleolus. CT may provide better evaluation. Electronically Signed   By: Anner Crete M.D.   On: 01/26/2016 01:37   Ct Head Wo Contrast  01/25/2016  CLINICAL DATA:  Slurred speech. EXAM: CT HEAD WITHOUT  CONTRAST TECHNIQUE: Contiguous axial images were obtained from the base of the skull through the vertex without intravenous contrast. COMPARISON:  None FINDINGS: There is patchy low attenuation within the subcortical white matter bilaterally compatible with chronic microvascular disease. There is no abnormal extra-axial fluid collection, intracranial hemorrhage or mass identified. No evidence for acute brain infarct. There is mild scratch set bilateral maxillary sinus disease identified. There is opacification of the left mastoid air cells. The right mastoid air cells appear underdeveloped. The calvarium is intact. IMPRESSION: 1. Small vessel ischemic disease. 2. No acute intracranial abnormalities noted. 3. Left mastoid air cell opacification. Mild bilateral maxillary sinus disease also noted. Electronically Signed   By: Kerby Moors M.D.   On: 01/25/2016 23:15   Mr Virgel Paling Wo Contrast  01/27/2016  CLINICAL DATA:  70 year old female with altered mental status, slurred speech, and weakness for 2 days. Initial encounter. EXAM: MRA HEAD WITHOUT CONTRAST TECHNIQUE: Angiographic images of the Circle of Willis were obtained using MRA technique without intravenous contrast. COMPARISON:  Brain MRI from today reported separately. Head CT without contrast 01/25/2016. FINDINGS: Antegrade flow in the posterior circulation with dominant appearing distal right vertebral artery. No distal vertebral artery stenosis. Patent vertebrobasilar junction. Mildly tortuous basilar artery without stenosis. Normal SCA and PCA origins. Normal right posterior communicating artery, the left is diminutive or absent. Bilateral PCA branches are within normal limits. Antegrade flow in both ICA siphons. ICA tortuosity without stenosis. The right ophthalmic and posterior communicating artery origins are within normal limits. Just distal to the left ophthalmic artery origin  arising in the supraclinoid ICA and directed superiorly there is a 3 mm  saccular aneurysm (series 1, image 72 and series 109, image 35). Otherwise normal distal ICAs. Patent carotid termini. Mildly dominant right ACA A1 segment. MCA and ACA origins within normal limits. Anterior communicating artery and proximal ACA branches within normal limits. Mild motion artifact affecting the distal ACA branches. MCA M1 segments and bifurcations are within normal limits. Motion artifact affecting the visualized bilateral MCA branches. IMPRESSION: 1. 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 Recommend Neuro-endovascular follow-up. 2. No intracranial stenosis stenosis. Mild intracranial tortuosity and otherwise negative for age intracranial MRA. Electronically Signed   By: Genevie Ann M.D.   On: 01/27/2016 08:52   Mr Brain Wo Contrast  01/27/2016  CLINICAL DATA:  70 year old female with altered mental status, slurred speech, and weakness for 2 days. Initial encounter. EXAM: MRI HEAD WITHOUT CONTRAST TECHNIQUE: Multiplanar, multiecho pulse sequences of the brain and surrounding structures were obtained without intravenous contrast. COMPARISON:  Head CT without contrast 01/25/2016 FINDINGS: Study is intermittently degraded by motion artifact despite repeated imaging attempts. No restricted diffusion or evidence of acute infarction. Major intracranial vascular flow voids are preserved. Cerebral volume is within normal limits for age. No midline shift. No ventriculomegaly. No acute intracranial hemorrhage identified. Negative pituitary and cervicomedullary junction. Evidence of a small right lateral convexity extra-axial mass encompassing 16 x 8 x 15 mm (AP by transverse by CC). This is most parent on diffusion-weighted imaging but also is visible on axial FLAIR and T1 imaging being nearly isointense to gray matter. The lesion appears mineralized on T2 * imaging (series 9, image 16) and was mildly hyperdense on the comparison CT. No other intracranial mass lesion identified. Bilateral scattered, patchy,  and confluent nonspecific cerebral white matter T2 and FLAIR hyperintensity. No cortical encephalomalacia. Deep gray matter nuclei, brainstem, and cerebellum are within normal limits. Left mastoid effusion. Nasopharynx within normal limits. The left tympanic cavity min may also be partially opacified. Mild right maxillary sinus mucosal thickening. Grossly negative orbit and scalp soft tissues. 3.4 cm left parietal bone lesion with T2 hyperintensity (series 6, image 16, FLAIR hyperintensity, and mild intrinsic increased T1 signal (series 6, image 64). This has conspicuous increased diffusion signal. This appeared non expansile and circumscribed on the recent CT. Elsewhere bone marrow signal appears within normal limits. Partially visible degenerative cervical spine changes with mild to moderate degenerative spinal stenosis suspected at C3-C4. IMPRESSION: 1.  No acute intracranial abnormality. 2. Right lateral convexity 1.6 cm meningioma suspected, unlikely clinically silent. 3. Moderate for age nonspecific white matter signal changes, most commonly due to chronic small vessel disease. 4. Left mastoid effusion with partial opacification of the left tympanic cavity, consider inflammatory or cholesteatoma etiology. 5. Degenerative cervical spinal stenosis at C3-C4. 6. Benign-appearing left parietal bone lesion, favor hemangioma. Electronically Signed   By: Genevie Ann M.D.   On: 01/27/2016 08:47   US Carotid Bilateral  01/26/2016  CLINICAL DATA:  TIA.  Down syndrome. EXAM: BILATERAL CAROTID DUPLEX ULTRASOUND TECHNIQUE: Pearline Cables scale imaging, color Doppler and duplex ultrasound was performed of bilateral carotid and vertebral arteries in the neck. COMPARISON:  None available REVIEW OF SYSTEMS: Quantification of carotid stenosis is based on velocity parameters that correlate the residual internal carotid diameter with NASCET-based stenosis levels, using the diameter of the distal internal carotid lumen as the denominator for  stenosis measurement. The following velocity measurements were obtained: PEAK SYSTOLIC/END DIASTOLIC RIGHT ICA:  78/24cm/sec CCA:                     [id number] SYSTOLIC ICA/CCA RATIO:  1.1 DIASTOLIC ICA/CCA RATIO: 2.2 ECA:                     116cm/sec LEFT ICA:                     118/26cm/sec CCA:                     84/69GE/XBM SYSTOLIC ICA/CCA RATIO:  1.4 DIASTOLIC ICA/CCA RATIO: 2.0 ECA:                     135cm/sec FINDINGS: RIGHT CAROTID ARTERY: Mild plaque at the bifurcation. No high-grade stenosis. Normal waveforms and color Doppler signal. RIGHT VERTEBRAL ARTERY: Normal flow direction and waveform. A nonspecific cardiac arrhythmia is noted. LEFT CAROTID ARTERY: Mild smooth noncalcified plaque in the carotid bulb extending into proximal internal and external carotid arteries. No high-grade stenosis. Normal waveforms and color Doppler signal. LEFT VERTEBRAL ARTERY: Normal flow direction and waveform. IMPRESSION: 1. Mild bilateral carotid bifurcation plaque resulting in less than 50% diameter stenosis. The exam does not exclude plaque ulceration or embolization. Continued surveillance recommended. 2. Nonspecific cardiac arrhythmia noted. Electronically Signed   By: Lucrezia Europe M.D.   On: 01/26/2016 10:18   Dg Tibia/fibula Left Port  01/27/2016  CLINICAL DATA:  Ankle fracture EXAM: PORTABLE LEFT TIBIA AND FIBULA - 2 VIEW COMPARISON:  None. FINDINGS: Mild diffuse osteopenia. Age in the determinate deformity involving the ankle is identified. Due to patient positioning exact details of the deformity is uncertain. Recommend further investigation with CT of the ankle. Chronic lateral tibial plateau fracture noted. IMPRESSION: 1. Age-indeterminate deformity involving the right ankle. Recommend further investigation with CT of the ankle. 2. Chronic lateral tibial plateau fracture. Electronically Signed   By: Kerby Moors M.D.   On: 01/27/2016 09:31   Dg Abd Acute W/chest  01/25/2016   CLINICAL DATA:  Nausea and vomiting beginning today. Atrial fibrillation. Systolic dysfunction. Hypertension. EXAM: DG ABDOMEN ACUTE W/ 1V CHEST COMPARISON:  12/25/2015 FINDINGS: There is no evidence of dilated bowel loops or free intraperitoneal air. No radiopaque calculi or other significant radiographic abnormality is seen. Moderate cardiomegaly stable. Small right pleural effusion and bibasilar atelectasis show no significant change. IMPRESSION: Unremarkable bowel gas pattern. Stable cardiomegaly, small right pleural effusion, and bibasilar atelectasis. Electronically Signed   By: Earle Gell M.D.   On: 01/25/2016 21:00    Review of Systems  Unable to perform ROS: medical condition   Blood pressure 123/58, pulse 77, temperature 98.6 F (37 C), temperature source Oral, resp. rate 20, height '5\' 4"'  (1.626 m), weight 143 lb 15.4 oz (65.3 kg), SpO2 97 %. Physical Exam This patient has abnormal facial features. She is oriented to person and place questionable time her mood is flat her affect is flat she is in bed with no annular cure maneuvers and since she's been in the hospital  Her left ankle is swollen she has tenderness over the medial malleolus she has plantarflexed supinated foot which corrects to neutral with knee flexion. Her overall range of motion at the ankle joint is 20 there is no instability muscle tone is normal skin is ecchymotic pulses are normal there is edema no lymphadenopathy in the left leg sensation remains normal. Balance and coordination could not be assessed and there  are no pathologic reflexes   Assessment/Plan: Medial malleolus fracture. Left ankle. She has plantarflexed supination positioning of both feet which correct with standing. She was in an Aircast but it was causing a significant amount of overflow swelling so that was removed and she was placed in a short Cam Walker. This required flexing her knee to get her foot in plantigrade position. I have made her family  aware that this may or may not control her ankle and we may have to go to a different device.  She is not a surgical candidate based on her current medical situation and her unknown genetic condition  Skin checks frequently once every shift.  Arther Abbott 01/27/2016, 2:16 PM

## 2016-01-27 NOTE — Consult Note (Signed)
Angel A. Merlene Laughter, MD     [email or web address]          Angel Torres is an 70 y.o. female.   ASSESSMENT/PLAN: Acute onset of dizziness associated with likely alteration of consciousness slow alert speech and weakness of the extremities. Etiology unclear but differential diagnosis includes transient ischemic attack and complex partial seizures.  Small right frontal extra-axial lesion most consistent with a meningioma. This can present with compress partial seizures.  Small aneurysm.  Baseline cognitive impairment due to Down syndrome.  Chronic atrial fibrillation.  RECOMMENDATION: Continue with anticoagulation. I would not recommend any antiplatelet agent at this time as this will likely increase the risk of hemorrhage without adding benefit. EEG. Neurointervention consultation to his consider coiling of the aneurysm.   The patient is 70 year old white female who has a baseline history of Down syndrome. She resides in a nursing home facility and does has a baseline cognitive impairment difficulties moving around. The family reports that she walks from with a cane or walker. The patient the palate about the acute onset of does not feel right associated with dizziness and dysarthria. She also reports that her legs were weak. The history is very difficult because the patient has baseline cognitive impairment and difficulty comprehending the conversation. She does not report however having chest pain, headaches or dyspnea. The patient apparently did complain of having left ankle pain and was found to have a fracture. She apparently had the left ankle pain before she had a spell of dysarthria and dizziness. The dizziness is described as a spinning like sensation. Workup has been negative for acute infarcts or acute changes on imaging.  GENERAL: She is in no acute distress.  HEENT: Supple. Atraumatic but some evidence of macrocephaly. Significantly impaired hearing.     ABDOMEN: soft  EXTREMITIES: No edema   BACK: Normal.  SKIN: Normal by inspection.    MENTAL STATUS: She is awake and alert. She does follow some commands but with much prompting. Comprehension is significantly impaired.  CRANIAL NERVES: Pupils are equal, round and reactive to light and accommodation; extra ocular movements are full, there is no significant nystagmus; visual fields are full; upper and lower facial muscles are normal in strength and symmetric, there is no flattening of the nasolabial folds; tongue is midline; uvula is midline; shoulder elevation is normal.  MOTOR: Exact strength is unobtainable but she has at least 4/5 strength throughout. Bulk and tone seems unremarkable.  COORDINATION: Left finger to nose is normal, right finger to nose is normal, No rest tremor; no intention tremor; no postural tremor; no bradykinesia.  REFLEXES: Deep tendon reflexes are symmetrical and normal. Babinski reflexes are extensor bilaterally.   SENSATION: Normal to pain both sides.   Brain MRI is reviewed in person. There is moderate ventriculomegaly associated with atrophy. There is moderate deep white matter and periventricular leukoencephalopathy. There is right frontal small lesion that is isodense on T1 and dark echo gradient contrast and bright on T2/FLAIR. Appears mostly extra-axial suggestive of a meningioma. There is a increased signal involving the left parietal bone.    Blood pressure 99/49, pulse 73, temperature 97.7 F (36.5 C), temperature source Oral, resp. rate 20, height _0  (1.626 m), weight 65.3 kg (143 lb 15.4 oz), SpO2 99 %.  Past Medical History  Diagnosis Date  . Down syndrome   . Essential hypertension   . Systolic dysfunction     LVEF 48%, indeterminate diastolic function  . Chronic atrial fibrillation ([place])   .  Pneumonia   . Pulmonary embolism ([place])   . Leg fracture   . Pulmonary hypertension ([facility])     Moderate  . Right ventricular dysfunction      Past Surgical History  Procedure Laterality Date  . Thoracentesis Right 12/09/2015    Family History  Problem Relation Age of Onset  . Arrhythmia Father   . Arrhythmia Brother     Social History:  reports that she has never smoked. She has never used smokeless tobacco. She reports that she does not drink alcohol or use illicit drugs.  Allergies: No Known Allergies  Medications: Prior to Admission medications   Medication Sig Start Date End Date Taking? Authorizing Provider  alendronate (FOSAMAX) 70 MG tablet Take 70 mg by mouth once a week. Takes on Mondays. Take with a full glass of water on an empty stomach.   Yes Historical Provider, MD  betamethasone dipropionate (DIPROLENE) 0.05 % cream Apply 1 application topically 2 (two) times daily as needed (for itching).   Yes Historical Provider, MD  Calcium Carbonate (CALCIUM 600 PO) Take 600 mg by mouth daily.   Yes Historical Provider, MD  digoxin (LANOXIN) 0.25 MG tablet Take 250 mcg by mouth daily.   Yes Historical Provider, MD  diltiazem (CARDIZEM) 60 MG tablet Take 1 tablet (60 mg total) by mouth 3 (three) times daily. 12/27/15  Yes Rexene Alberts, MD  furosemide (LASIX) 40 MG tablet Take 80 mg in the morning and 40 mg in the evening Patient taking differently: Take 40-80 mg by mouth 2 (two) times daily. Take 80 mg in the morning and 40 mg in the evening 01/01/16  Yes Satira Sark, MD  glipiZIDE (GLUCOTROL) 5 MG tablet Take 1 tablet (5 mg total) by mouth 2 (two) times daily before a meal. HOLD IF BLOOD SUGAR IS LESS THAN 115. 12/27/15  Yes Rexene Alberts, MD  lisinopril (PRINIVIL,ZESTRIL) 5 MG tablet Take 5 mg by mouth daily.   Yes Historical Provider, MD  metFORMIN (GLUCOPHAGE-XR) 500 MG 24 hr tablet Take 1 tablet (500 mg total) by mouth daily. DO NOT START UNTIL 12/28/2015. 12/27/15  Yes Rexene Alberts, MD  metoprolol tartrate (LOPRESSOR) 25 MG tablet Take 25 mg by mouth 2 (two) times daily.   Yes Historical Provider, MD  triamcinolone  cream (KENALOG) 0.1 % Apply 1 application topically 2 (two) times daily.   Yes Historical Provider, MD  Vitamin D, Cholecalciferol, 400 UNITS TABS Take 400 Units by mouth daily.   Yes Historical Provider, MD  warfarin (COUMADIN) 2 MG tablet Take 1-2 tablets by mouth See admin instructions. Take 1 tablet by mouth on Tuesday, Thursday and Saturday.  Take (1/2) tablet by mouth on Sunday, Monday, Wednesday, and Friday. 12/02/15  Yes Historical Provider, MD  warfarin (COUMADIN) 6 MG tablet Take 6 mg by mouth every evening.   Yes Historical Provider, MD    Scheduled Meds: . aspirin  300 mg Rectal Daily   Or  . aspirin  325 mg Oral Daily  . calcium carbonate  1 tablet Oral Q breakfast  . digoxin  250 mcg Oral Daily  . diltiazem  60 mg Oral TID  . furosemide  40 mg Oral Daily  . lisinopril  5 mg Oral Daily  . meclizine  12.5 mg Oral TID  . metoprolol tartrate  25 mg Oral BID  . Warfarin - Pharmacist Dosing Inpatient   Does not apply Q24H   Continuous Infusions:  PRN Meds:.oxyCODONE-acetaminophen, senna-docusate     Results for orders  placed or performed during the hospital encounter of 01/25/16 (from the past 48 hour(s))  Comprehensive metabolic panel     Status: Abnormal   Collection Time: 01/25/16  7:05 PM  Result Value Ref Range   Sodium 132 (L) 135 - 145 mmol/L   Potassium 3.4 (L) 3.5 - 5.1 mmol/L   Chloride 95 (L) 101 - 111 mmol/L   CO2 25 22 - 32 mmol/L   Glucose, Bld 205 (H) 65 - 99 mg/dL   BUN 16 6 - 20 mg/dL   Creatinine, Ser 0.53 0.44 - 1.00 mg/dL   Calcium 8.5 (L) 8.9 - 10.3 mg/dL   Total Protein 7.7 6.5 - 8.1 g/dL   Albumin 3.9 3.5 - 5.0 g/dL   AST 24 15 - 41 U/L   ALT 16 14 - 54 U/L   Alkaline Phosphatase 116 38 - 126 U/L   Total Bilirubin 1.3 (H) 0.3 - 1.2 mg/dL   GFR calc non Af Amer >60 >60 mL/min   GFR calc Af Amer >60 >60 mL/min    Comment: (NOTE) The eGFR has been calculated using the CKD EPI equation. This calculation has not been validated in all clinical  situations. eGFR's persistently <60 mL/min signify possible Chronic Kidney Disease.    Anion gap 12 5 - 15  Lipase, blood     Status: None   Collection Time: 01/25/16  7:05 PM  Result Value Ref Range   Lipase 29 11 - 51 U/L  Troponin I     Status: None   Collection Time: 01/25/16  7:05 PM  Result Value Ref Range   Troponin I <0.03 <0.031 ng/mL    Comment:        NO INDICATION OF MYOCARDIAL INJURY.   Lactic acid, plasma     Status: None   Collection Time: 01/25/16  7:05 PM  Result Value Ref Range   Lactic Acid, Venous 1.8 0.5 - 2.0 mmol/L  CBC with Differential     Status: Abnormal   Collection Time: 01/25/16  7:05 PM  Result Value Ref Range   WBC 12.8 (H) 4.0 - 10.5 K/uL   RBC 4.58 3.87 - 5.11 MIL/uL   Hemoglobin 13.2 12.0 - 15.0 g/dL   HCT 39.4 36.0 - 46.0 %   MCV 86.0 78.0 - 100.0 fL   MCH 28.8 26.0 - 34.0 pg   MCHC 33.5 30.0 - 36.0 g/dL   RDW 14.4 11.5 - 15.5 %   Platelets 164 150 - 400 K/uL   Neutrophils Relative % 86 %   Neutro Abs 10.9 (H) 1.7 - 7.7 K/uL   Lymphocytes Relative 7 %   Lymphs Abs 0.9 0.7 - 4.0 K/uL   Monocytes Relative 6 %   Monocytes Absolute 0.7 0.1 - 1.0 K/uL   Eosinophils Relative 1 %   Eosinophils Absolute 0.1 0.0 - 0.7 K/uL   Basophils Relative 0 %   Basophils Absolute 0.0 0.0 - 0.1 K/uL  Protime-INR     Status: Abnormal   Collection Time: 01/25/16  7:05 PM  Result Value Ref Range   Prothrombin Time 26.2 (H) 11.6 - 15.2 seconds   INR 2.44 (H) 0.00 - 1.49  Digoxin level     Status: None   Collection Time: 01/25/16  7:05 PM  Result Value Ref Range   Digoxin Level 1.0 0.8 - 2.0 ng/mL  Urinalysis, Routine w reflex microscopic     Status: Abnormal   Collection Time: 01/25/16  8:41 PM  Result Value Ref Range  Color, Urine YELLOW YELLOW   APPearance CLEAR CLEAR   Specific Gravity, Urine 1.020 1.005 - 1.030   pH 5.0 5.0 - 8.0   Glucose, UA NEGATIVE NEGATIVE mg/dL   Hgb urine dipstick TRACE (A) NEGATIVE   Bilirubin Urine NEGATIVE NEGATIVE    Ketones, ur 15 (A) NEGATIVE mg/dL   Protein, ur 100 (A) NEGATIVE mg/dL   Nitrite NEGATIVE NEGATIVE   Leukocytes, UA NEGATIVE NEGATIVE  Urine microscopic-add on     Status: Abnormal   Collection Time: 01/25/16  8:41 PM  Result Value Ref Range   Squamous Epithelial / LPF 0-5 (A) NONE SEEN   WBC, UA 0-5 0 - 5 WBC/hpf   RBC / HPF 0-5 0 - 5 RBC/hpf   Bacteria, UA MANY (A) NONE SEEN  Lactic acid, plasma     Status: None   Collection Time: 01/25/16 10:17 PM  Result Value Ref Range   Lactic Acid, Venous 1.5 0.5 - 2.0 mmol/L  Magnesium     Status: None   Collection Time: 01/25/16 10:17 PM  Result Value Ref Range   Magnesium 1.7 1.7 - 2.4 mg/dL  Brain natriuretic peptide     Status: Abnormal   Collection Time: 01/25/16 10:20 PM  Result Value Ref Range   B Natriuretic Peptide 432.0 (H) 0.0 - 100.0 pg/mL  MRSA PCR Screening     Status: None   Collection Time: 01/26/16  2:22 AM  Result Value Ref Range   MRSA by PCR NEGATIVE NEGATIVE    Comment:        The GeneXpert MRSA Assay (FDA approved for NASAL specimens only), is one component of a comprehensive MRSA colonization surveillance program. It is not intended to diagnose MRSA infection nor to guide or monitor treatment for MRSA infections.   Hemoglobin A1c     Status: Abnormal   Collection Time: 01/26/16  7:05 AM  Result Value Ref Range   Hgb A1c MFr Bld 7.1 (H) 4.8 - 5.6 %    Comment: (NOTE)         Pre-diabetes: 5.7 - 6.4         Diabetes: >6.4         Glycemic control for adults with diabetes: <7.0    Mean Plasma Glucose 157 mg/dL    Comment: (NOTE) Performed At: [facility] [place], [place] [id number] Lindon Romp MD [id number]   Lipid panel     Status: Abnormal   Collection Time: 01/26/16  7:05 AM  Result Value Ref Range   Cholesterol 80 0 - 200 mg/dL   Triglycerides 56 <150 mg/dL   HDL 24 (L) >40 mg/dL   Total CHOL/HDL Ratio 3.3 RATIO   VLDL 11 0 - 40 mg/dL   LDL Cholesterol 45 0  - 99 mg/dL    Comment:        Total Cholesterol/HDL:CHD Risk Coronary Heart Disease Risk Table                     Men   Women  1/2 Average Risk   3.4   3.3  Average Risk       5.0   4.4  2 X Average Risk   9.6   7.1  3 X Average Risk  23.4   11.0        Use the calculated Patient Ratio above and the CHD Risk Table to determine the patient's CHD Risk.        ATP III  CLASSIFICATION (LDL):  <100     mg/dL   Optimal  100-129  mg/dL   Near or Above                    Optimal  130-159  mg/dL   Borderline  160-189  mg/dL   High  >190     mg/dL   Very High   Protime-INR     Status: Abnormal   Collection Time: 01/26/16  7:05 AM  Result Value Ref Range   Prothrombin Time 29.8 (H) 11.6 - 15.2 seconds   INR 2.90 (H) 0.00 - 1.49  Vitamin B12     Status: None   Collection Time: 01/26/16  7:05 AM  Result Value Ref Range   Vitamin B-12 426 180 - 914 pg/mL    Comment: (NOTE) This assay is not validated for testing neonatal or myeloproliferative syndrome specimens for Vitamin B12 levels. Performed at [facility]   Folate     Status: None   Collection Time: 01/26/16  7:05 AM  Result Value Ref Range   Folate 13.1 >5.9 ng/mL    Comment: Performed at [facility]  TSH     Status: None   Collection Time: 01/26/16  7:05 AM  Result Value Ref Range   TSH 0.503 0.350 - 4.500 uIU/mL  Protime-INR     Status: Abnormal   Collection Time: 01/27/16  7:10 AM  Result Value Ref Range   Prothrombin Time 32.2 (H) 11.6 - 15.2 seconds   INR 3.21 (H) 0.00 - 1.49  CBC     Status: None   Collection Time: 01/27/16  7:10 AM  Result Value Ref Range   WBC 9.3 4.0 - 10.5 K/uL   RBC 4.41 3.87 - 5.11 MIL/uL   Hemoglobin 12.6 12.0 - 15.0 g/dL   HCT 38.5 36.0 - 46.0 %   MCV 87.3 78.0 - 100.0 fL   MCH 28.6 26.0 - 34.0 pg   MCHC 32.7 30.0 - 36.0 g/dL   RDW 14.6 11.5 - 15.5 %   Platelets 179 150 - 400 K/uL  Basic metabolic panel     Status: Abnormal   Collection Time: 01/27/16  7:10 AM    Result Value Ref Range   Sodium 135 135 - 145 mmol/L   Potassium 3.8 3.5 - 5.1 mmol/L   Chloride 99 (L) 101 - 111 mmol/L   CO2 28 22 - 32 mmol/L   Glucose, Bld 129 (H) 65 - 99 mg/dL   BUN 17 6 - 20 mg/dL   Creatinine, Ser 0.47 0.44 - 1.00 mg/dL   Calcium 8.4 (L) 8.9 - 10.3 mg/dL   GFR calc non Af Amer >60 >60 mL/min   GFR calc Af Amer >60 >60 mL/min    Comment: (NOTE) The eGFR has been calculated using the CKD EPI equation. This calculation has not been validated in all clinical situations. eGFR's persistently <60 mL/min signify possible Chronic Kidney Disease.    Anion gap 8 5 - 15    Studies/Results:  BRAIN MRI/MRA 1. 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 Recommend Neuro-endovascular follow-up. 2. No intracranial stenosis stenosis. Mild intracranial tortuosity and otherwise negative for age intracranial MRA.  1.  No acute intracranial abnormality. 2. Right lateral convexity 1.6 cm meningioma suspected, unlikely clinically silent. 3. Moderate for age nonspecific white matter signal changes, most commonly due to chronic small vessel disease. 4. Left mastoid effusion with partial opacification of the left tympanic cavity, consider inflammatory or  cholesteatoma etiology. 5. Degenerative cervical spinal stenosis at C3-C4. 6. Benign-appearing left parietal bone lesion, favor hemangioma.     Samson Ralph A. Merlene Torres, M.D.  Diplomate, Tax adviser of Psychiatry and Neurology ( Neurology). 01/27/2016, 4:26 PM

## 2016-01-27 NOTE — Progress Notes (Signed)
ANTICOAGULATION CONSULT NOTE   Pharmacy Consult for Coumadin Indication: atrial fibrillation  No Known Allergies  Patient Measurements: Height: 5\' 4"  (162.6 cm) Weight: 143 lb 15.4 oz (65.3 kg) IBW/kg (Calculated) : 54.7  Vital Signs: Temp: 98.6 F (37 C) (03/06 0345) Temp Source: Oral (03/06 0345) BP: 123/58 mmHg (03/06 1023) Pulse Rate: 77 (03/06 1023)  Labs:  Recent Labs  01/25/16 1905 01/26/16 0705 01/27/16 0710  HGB 13.2  --  12.6  HCT 39.4  --  38.5  PLT 164  --  179  LABPROT 26.2* 29.8* 32.2*  INR 2.44* 2.90* 3.21*  CREATININE 0.53  --  0.47  TROPONINI <0.03  --   --     Estimated Creatinine Clearance: 56.5 mL/min (by C-G formula based on Cr of 0.47).   Medical History: Past Medical History  Diagnosis Date  . Down syndrome   . Essential hypertension   . Systolic dysfunction     LVEF 16%48%, indeterminate diastolic function  . Chronic atrial fibrillation ([facility])   . Pneumonia   . Pulmonary embolism ([facility])   . Leg fracture   . Pulmonary hypertension ([facility])     Moderate  . Right ventricular dysfunction     Medications:  Prescriptions prior to admission  Medication Sig Dispense Refill Last Dose  . alendronate (FOSAMAX) 70 MG tablet Take 70 mg by mouth once a week. Takes on Mondays. Take with a full glass of water on an empty stomach.   Past Week at Unknown time  . betamethasone dipropionate (DIPROLENE) 0.05 % cream Apply 1 application topically 2 (two) times daily as needed (for itching).   unknown  . Calcium Carbonate (CALCIUM 600 PO) Take 600 mg by mouth daily.   01/25/2016 at 800a  . digoxin (LANOXIN) 0.25 MG tablet Take 250 mcg by mouth daily.   01/24/2016 at 800a  . diltiazem (CARDIZEM) 60 MG tablet Take 1 tablet (60 mg total) by mouth 3 (three) times daily. 90 tablet 3 01/24/2016 at 1400  . furosemide (LASIX) 40 MG tablet Take 80 mg in the morning and 40 mg in the evening (Patient taking differently: Take 40-80 mg by mouth 2 (two) times daily. Take 80 mg in  the morning and 40 mg in the evening) 90 tablet 3 01/24/2016 at Unknown time  . glipiZIDE (GLUCOTROL) 5 MG tablet Take 1 tablet (5 mg total) by mouth 2 (two) times daily before a meal. HOLD IF BLOOD SUGAR IS LESS THAN 115.   01/24/2016 at 800  . lisinopril (PRINIVIL,ZESTRIL) 5 MG tablet Take 5 mg by mouth daily.   01/24/2016 at 800a  . metFORMIN (GLUCOPHAGE-XR) 500 MG 24 hr tablet Take 1 tablet (500 mg total) by mouth daily. DO NOT START UNTIL 12/28/2015.   01/24/2016 at 800a  . metoprolol tartrate (LOPRESSOR) 25 MG tablet Take 25 mg by mouth 2 (two) times daily.   01/23/2016 at 800  . triamcinolone cream (KENALOG) 0.1 % Apply 1 application topically 2 (two) times daily.   01/25/2016 at 800a  . Vitamin D, Cholecalciferol, 400 UNITS TABS Take 400 Units by mouth daily.   01/25/2016 at 800a  . warfarin (COUMADIN) 2 MG tablet Take 1-2 tablets by mouth See admin instructions. Take 1 tablet by mouth on Tuesday, Thursday and Saturday.  Take (1/2) tablet by mouth on Sunday, Monday, Wednesday, and Friday.   01/25/2016 at 1700  . warfarin (COUMADIN) 6 MG tablet Take 6 mg by mouth every evening.   01/24/2016 at 1700    Assessment:  70 yo female admitted N/V, slurred speech, difficulty ambulating. Admitted for TIA workup Continuation of coumadin PTA for AFIB. Dose at home was  and reduced yesterday to . INR 3.21 this AM, supra therapeutic so will not give any today.   Goal of Therapy:  INR 2-3 Monitor platelets by anticoagulation protocol: Yes   Plan:  No coumadin today INR/PT daily Monitor for s/s of bleeding  Elder Cyphers, BS Loura Back, BCPS Clinical Pharmacist Pager [phone] 01/27/2016,11:47 AM

## 2016-01-27 NOTE — Progress Notes (Signed)
Received a call from central telemetry stating patient had a 7 beat run of VTach.  Patient sleeping in bed.  Dr. Conley RollsLe notified via text page.

## 2016-01-28 ENCOUNTER — Encounter (HOSPITAL_COMMUNITY): Payer: Self-pay

## 2016-01-28 ENCOUNTER — Observation Stay (HOSPITAL_COMMUNITY)
Admit: 2016-01-28 | Discharge: 2016-01-28 | Disposition: A | Discharge status: Home / Self Care | Payer: Medicare Other | Attending: Neurology | Admitting: Neurology

## 2016-01-28 DIAGNOSIS — G459 Transient cerebral ischemic attack, unspecified: Secondary | ICD-10-CM | POA: Diagnosis not present

## 2016-01-28 DIAGNOSIS — I509 Heart failure, unspecified: Secondary | ICD-10-CM | POA: Diagnosis not present

## 2016-01-28 DIAGNOSIS — R110 Nausea: Secondary | ICD-10-CM | POA: Diagnosis not present

## 2016-01-28 DIAGNOSIS — Z7901 Long term (current) use of anticoagulants: Secondary | ICD-10-CM | POA: Diagnosis not present

## 2016-01-28 DIAGNOSIS — R569 Unspecified convulsions: Secondary | ICD-10-CM | POA: Diagnosis not present

## 2016-01-28 DIAGNOSIS — R471 Dysarthria and anarthria: Secondary | ICD-10-CM | POA: Diagnosis not present

## 2016-01-28 DIAGNOSIS — I4891 Unspecified atrial fibrillation: Secondary | ICD-10-CM | POA: Diagnosis not present

## 2016-01-28 DIAGNOSIS — R112 Nausea with vomiting, unspecified: Secondary | ICD-10-CM | POA: Diagnosis not present

## 2016-01-28 LAB — GLUCOSE, CAPILLARY: GLUCOSE-CAPILLARY: 170 mg/dL — AB (ref 65–99)

## 2016-01-28 LAB — PROTIME-INR
INR: 2.46 — ABNORMAL HIGH (ref 0.00–1.49)
Prothrombin Time: 26.4 seconds — ABNORMAL HIGH (ref 11.6–15.2)

## 2016-01-28 MED ORDER — INSULIN ASPART 100 UNIT/ML ~~LOC~~ SOLN
0.0000 [IU] | Freq: Three times a day (TID) | SUBCUTANEOUS | Status: DC
  Administered 2016-01-29 – 2016-01-30 (×5): 1 [IU] via SUBCUTANEOUS

## 2016-01-28 MED ORDER — INSULIN ASPART 100 UNIT/ML ~~LOC~~ SOLN: 0.0000 [IU] | Freq: Every day | SUBCUTANEOUS | Status: DC

## 2016-01-28 MED ORDER — WARFARIN SODIUM 5 MG PO TABS
5.0000 mg | ORAL_TABLET | Freq: Once | ORAL | Status: AC
  Administered 2016-01-28: 5 mg via ORAL
  Filled 2016-01-28: qty 1

## 2016-01-28 NOTE — Progress Notes (Signed)
[facility]                                                                              Patient Demographics  Angel Torres, is a 70 y.o. female, DOB - 1946/08/15, ZHY:[id number]  Admit date - 01/25/2016   Admitting Physician Houston Siren, MD  Outpatient Primary MD for the patient is [facility]  LOS -    Chief Complaint  Patient presents with  . Emesis       Brief HPI   Angel Torres is an 70 y.o. female with a hx of HTN, atrial fibrillation, and diastolic CHF that presents from [facility] with vomiting. Per family members, patient was eating dinner when she stated that she had a headache and began vomiting. EMS gave IV Zofran with improvement however she still reports not feeling well. Unclear if the patient is having abdominal pain. Family members at bedside believe that the patient's speech is slurred and state that she has been unable to ambulate at her baseline today. They also state that she has been complaining of left ankle pain and swelling recently. They are unable to give specific last known well time. While in the ED, vital signs were stable. Labs revelead a unremarkable BMP, negative troponin and lactic acid, BNP 432, and WBC 12.8. EKG, CXR, and AAS were unremarkable and head CT did not show any acute changes. Hospitalist was asked to admit for TIA workup.    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,d recommend neuroendovascular follow-up - 2-D echo showed EF of 35-40% and grade 2 diastolic dysfunction, diffuse hypokinesis - Carotid Doppler showed mild bilateral carotid bifurcation plaque less than 50% diameter  stenosis - Hold off on the PTOT until orthopedics evaluation - Patient passed swallow evaluation - LDL 45 , hemoglobin A1c 7.1 - Placed on meclizine, antiemetics  - Neurology consulted-> recommending anti-coagulation (no need of antiplatelet agent as it will increase hemorrhagic risk), EEG, neuro intervention consultation to consider coiling of aneurysm-> called Dr Drusilla Kanner recommended to call his office [facility] [phone] scheduler) and follow-up appointment in 1 week  Active Problems:   Chronic atrial fibrillation (HCC) - Currently rate controlled, continue Cardizem, - [place] vasc 4 Continue warfarin, discontinued aspirin    Chronic Biventricular heart failure (HCC) -Currently compensated, discontinue IV fluids to avoid fluid overload, patient tolerating diet without any difficulty. - 2-D echo with EF of 35-40%, grade 2 diastolic dysfunction, diffuse hypokinesis - Currently euvolemic, continue Lasix 40mg  daily to avoid hypotension ( takes 80 mg in a.m. and 40 mg in p.m.)    Type 2 diabetes mellitus (HCC) - Hemoglobin A1c 7.1 - Placed on sliding scale insulin  Left ankle fracture - X-ray showed minimally displaced fracture of the medial malleolus. O - orthopedics consulted, per Dr. Romeo Apple not a surgical candidate based on her current  medical situation. Cam Walker boot placed - PT evaluation pending   Code Status: full code  Family Communication: Discussed in detail with the patient, all imaging results, lab results explained to the patient and brother at the bedside   Disposition Plan: EEG pending, likely DC to skilled nursing facility in a.m. Patient needs appointment with Dr. Corliss Skainseveshwar in one week (I called twice and was not able to reach the scheduler) .   Time Spent in minutes  25 minutes  Procedures  CT head Carotid Dopplers, 2-D echo  Consults   Orthopedics  Neurology  DVT Prophylaxis warfarin  Medications  Scheduled Meds: . aspirin  300 mg Rectal Daily     Or  . aspirin  325 mg Oral Daily  . calcium carbonate  1 tablet Oral Q breakfast  . digoxin  250 mcg Oral Daily  . diltiazem  60 mg Oral TID  . furosemide  40 mg Oral Daily  . meclizine  12.5 mg Oral TID  . warfarin  5 mg Oral Once  . Warfarin - Pharmacist Dosing Inpatient   Does not apply Q24H   Continuous Infusions:  PRN Meds:.oxyCODONE-acetaminophen, senna-docusate   Antibiotics   Anti-infectives    None        Subjective:   Angel Torres was seen and examined today. Dizziness improved, almost back to baseline, speech better.  Tolerating diet without any difficulty.  Patient denies chest pain, shortness of breath, abdominal pain, N/V/D/C, new weakness, numbess, tingling. No acute events overnight.    Objective:   Filed Vitals:   01/28/16 0345 01/28/16 0745 01/28/16 1145 01/28/16 1342  BP: 122/61 126/59 146/71   Pulse: 57 62 64   Temp: 98.2 F (36.8 C) 98.7 F (37.1 C) 98.4 F (36.9 C)   TempSrc: Oral Oral Oral   Resp: 20  18   Height:      Weight:      SpO2: 96% 97% 90% 96%    Intake/Output Summary (Last 24 hours) at 01/28/16 1449 Last data filed at 01/28/16 0345  Gross per 24 hour  Intake    360 ml  Output    150 ml  Net    210 ml     Wt Readings from Last 3 Encounters:  01/26/16 65.3 kg (143 lb 15.4 oz)  01/15/16 66.679 kg (147 lb)  01/01/16 62.596 kg (138 lb)     Exam  General: Alert and oriented , NAD  HEENT:    Neck:   CVS: S1 S2clear, RRR  Respiratory: CTAB, no wheezing or rales  Abdomen: Soft, nontender, nondistended, + bowel sounds  Ext: no cyanosis clubbing or edema, left foot in Cam Walker boot  Neuro:  Skin: No rashes  Psych: Normal affect and demeanor, alert and oriented x3    Data Review   Micro Results Recent Results (from the past 240 hour(s))  MRSA PCR Screening     Status: None   Collection Time: 01/26/16  2:22 AM  Result Value Ref Range Status   MRSA by PCR NEGATIVE NEGATIVE Final    Comment:         The GeneXpert MRSA Assay (FDA approved for NASAL specimens only), is one component of a comprehensive MRSA colonization surveillance program. It is not intended to diagnose MRSA infection nor to guide or monitor treatment for MRSA infections.     Radiology Reports Dg Ankle Complete Left  01/26/2016  CLINICAL DATA:  70 year old female with left ankle pain and swelling and redness. EXAM: LEFT  ANKLE COMPLETE - 3+ VIEW COMPARISON:  Radiograph dated 07/16/2008 FINDINGS: There is a minimally displaced fracture of the medial malleolus. No other acute fracture identified. The bones are osteopenic which limits evaluation for fracture. There is mild narrowing of the medial aspect of the ankle mortise. There is diffuse subcutaneous edema with diffuse speckled skin calcification, likely related to chronic venous insufficiency. No radiopaque foreign object identified. IMPRESSION: Minimally displaced fracture of the medial malleolus. CT may provide better evaluation. Electronically Signed   By: Elgie Collard M.D.   On: 01/26/2016 01:37   Ct Head Wo Contrast  01/25/2016  CLINICAL DATA:  Slurred speech. EXAM: CT HEAD WITHOUT CONTRAST TECHNIQUE: Contiguous axial images were obtained from the base of the skull through the vertex without intravenous contrast. COMPARISON:  None FINDINGS: There is patchy low attenuation within the subcortical white matter bilaterally compatible with chronic microvascular disease. There is no abnormal extra-axial fluid collection, intracranial hemorrhage or mass identified. No evidence for acute brain infarct. There is mild scratch set bilateral maxillary sinus disease identified. There is opacification of the left mastoid air cells. The right mastoid air cells appear underdeveloped. The calvarium is intact. IMPRESSION: 1. Small vessel ischemic disease. 2. No acute intracranial abnormalities noted. 3. Left mastoid air cell opacification. Mild bilateral maxillary sinus disease also  noted. Electronically Signed   By: Signa Kell M.D.   On: 01/25/2016 23:15   Mr Shirlee Latch Wo Contrast  01/27/2016  CLINICAL DATA:  70 year old female with altered mental status, slurred speech, and weakness for 2 days. Initial encounter. EXAM: MRA HEAD WITHOUT CONTRAST TECHNIQUE: Angiographic images of the Circle of Willis were obtained using MRA technique without intravenous contrast. COMPARISON:  Brain MRI from today reported separately. Head CT without contrast 01/25/2016. FINDINGS: Antegrade flow in the posterior circulation with dominant appearing distal right vertebral artery. No distal vertebral artery stenosis. Patent vertebrobasilar junction. Mildly tortuous basilar artery without stenosis. Normal SCA and PCA origins. Normal right posterior communicating artery, the left is diminutive or absent. Bilateral PCA branches are within normal limits. Antegrade flow in both ICA siphons. ICA tortuosity without stenosis. The right ophthalmic and posterior communicating artery origins are within normal limits. Just distal to the left ophthalmic artery origin arising in the supraclinoid ICA and directed superiorly there is a 3 mm saccular aneurysm (series 1, image 72 and series 109, image 35). Otherwise normal distal ICAs. Patent carotid termini. Mildly dominant right ACA A1 segment. MCA and ACA origins within normal limits. Anterior communicating artery and proximal ACA branches within normal limits. Mild motion artifact affecting the distal ACA branches. MCA M1 segments and bifurcations are within normal limits. Motion artifact affecting the visualized bilateral MCA branches. IMPRESSION: 1. 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 Recommend Neuro-endovascular follow-up. 2. No intracranial stenosis stenosis. Mild intracranial tortuosity and otherwise negative for age intracranial MRA. Electronically Signed   By: Odessa Fleming M.D.   On: 01/27/2016 08:52   Mr Brain Wo Contrast  01/27/2016  CLINICAL DATA:  70 year old  female with altered mental status, slurred speech, and weakness for 2 days. Initial encounter. EXAM: MRI HEAD WITHOUT CONTRAST TECHNIQUE: Multiplanar, multiecho pulse sequences of the brain and surrounding structures were obtained without intravenous contrast. COMPARISON:  Head CT without contrast 01/25/2016 FINDINGS: Study is intermittently degraded by motion artifact despite repeated imaging attempts. No restricted diffusion or evidence of acute infarction. Major intracranial vascular flow voids are preserved. Cerebral volume is within normal limits for age. No midline shift. No ventriculomegaly. No  acute intracranial hemorrhage identified. Negative pituitary and cervicomedullary junction. Evidence of a small right lateral convexity extra-axial mass encompassing 16 x 8 x 15 mm (AP by transverse by CC). This is most parent on diffusion-weighted imaging but also is visible on axial FLAIR and T1 imaging being nearly isointense to gray matter. The lesion appears mineralized on T2 * imaging (series 9, image 16) and was mildly hyperdense on the comparison CT. No other intracranial mass lesion identified. Bilateral scattered, patchy, and confluent nonspecific cerebral white matter T2 and FLAIR hyperintensity. No cortical encephalomalacia. Deep gray matter nuclei, brainstem, and cerebellum are within normal limits. Left mastoid effusion. Nasopharynx within normal limits. The left tympanic cavity min may also be partially opacified. Mild right maxillary sinus mucosal thickening. Grossly negative orbit and scalp soft tissues. 3.4 cm left parietal bone lesion with T2 hyperintensity (series 6, image 16, FLAIR hyperintensity, and mild intrinsic increased T1 signal (series 6, image 64). This has conspicuous increased diffusion signal. This appeared non expansile and circumscribed on the recent CT. Elsewhere bone marrow signal appears within normal limits. Partially visible degenerative cervical spine changes with mild to  moderate degenerative spinal stenosis suspected at C3-C4. IMPRESSION: 1.  No acute intracranial abnormality. 2. Right lateral convexity 1.6 cm meningioma suspected, unlikely clinically silent. 3. Moderate for age nonspecific white matter signal changes, most commonly due to chronic small vessel disease. 4. Left mastoid effusion with partial opacification of the left tympanic cavity, consider inflammatory or cholesteatoma etiology. 5. Degenerative cervical spinal stenosis at C3-C4. 6. Benign-appearing left parietal bone lesion, favor hemangioma. Electronically Signed   By: Odessa Fleming M.D.   On: 01/27/2016 08:47   US Carotid Bilateral  01/26/2016  CLINICAL DATA:  TIA.  Down syndrome. EXAM: BILATERAL CAROTID DUPLEX ULTRASOUND TECHNIQUE: Wallace Cullens scale imaging, color Doppler and duplex ultrasound was performed of bilateral carotid and vertebral arteries in the neck. COMPARISON:  None available REVIEW OF SYSTEMS: Quantification of carotid stenosis is based on velocity parameters that correlate the residual internal carotid diameter with NASCET-based stenosis levels, using the diameter of the distal internal carotid lumen as the denominator for stenosis measurement. The following velocity measurements were obtained: PEAK SYSTOLIC/END DIASTOLIC RIGHT ICA:                     78/24cm/sec CCA:                     76/11cm/sec SYSTOLIC ICA/CCA RATIO:  1.1 DIASTOLIC ICA/CCA RATIO: 2.2 ECA:                     116cm/sec LEFT ICA:                     118/26cm/sec CCA:                     86/13cm/sec SYSTOLIC ICA/CCA RATIO:  1.4 DIASTOLIC ICA/CCA RATIO: 2.0 ECA:                     135cm/sec FINDINGS: RIGHT CAROTID ARTERY: Mild plaque at the bifurcation. No high-grade stenosis. Normal waveforms and color Doppler signal. RIGHT VERTEBRAL ARTERY: Normal flow direction and waveform. A nonspecific cardiac arrhythmia is noted. LEFT CAROTID ARTERY: Mild smooth noncalcified plaque in the carotid bulb extending into proximal internal and  external carotid arteries. No high-grade stenosis. Normal waveforms and color Doppler signal. LEFT VERTEBRAL ARTERY: Normal flow direction and waveform. IMPRESSION: 1. Mild bilateral carotid  bifurcation plaque resulting in less than 50% diameter stenosis. The exam does not exclude plaque ulceration or embolization. Continued surveillance recommended. 2. Nonspecific cardiac arrhythmia noted. Electronically Signed   By: Corlis Leak M.D.   On: 01/26/2016 10:18   Dg Tibia/fibula Left Port  01/27/2016  CLINICAL DATA:  Ankle fracture EXAM: PORTABLE LEFT TIBIA AND FIBULA - 2 VIEW COMPARISON:  None. FINDINGS: Mild diffuse osteopenia. Age in the determinate deformity involving the ankle is identified. Due to patient positioning exact details of the deformity is uncertain. Recommend further investigation with CT of the ankle. Chronic lateral tibial plateau fracture noted. IMPRESSION: 1. Age-indeterminate deformity involving the right ankle. Recommend further investigation with CT of the ankle. 2. Chronic lateral tibial plateau fracture. Electronically Signed   By: Signa Kell M.D.   On: 01/27/2016 09:31   Dg Abd Acute W/chest  01/25/2016  CLINICAL DATA:  Nausea and vomiting beginning today. Atrial fibrillation. Systolic dysfunction. Hypertension. EXAM: DG ABDOMEN ACUTE W/ 1V CHEST COMPARISON:  12/25/2015 FINDINGS: There is no evidence of dilated bowel loops or free intraperitoneal air. No radiopaque calculi or other significant radiographic abnormality is seen. Moderate cardiomegaly stable. Small right pleural effusion and bibasilar atelectasis show no significant change. IMPRESSION: Unremarkable bowel gas pattern. Stable cardiomegaly, small right pleural effusion, and bibasilar atelectasis. Electronically Signed   By: Myles Rosenthal M.D.   On: 01/25/2016 21:00   Mm Digital Screening Bilateral  01/17/2016  CLINICAL DATA:  Screening. EXAM: DIGITAL SCREENING BILATERAL MAMMOGRAM WITH CAD COMPARISON:  Previous exam(s). ACR  Breast Density Category b: There are scattered areas of fibroglandular density. FINDINGS: In the right breast, calcifications warrant further evaluation with magnified views. In the left breast, no findings suspicious for malignancy. Images were processed with CAD. IMPRESSION: Further evaluation is suggested for calcifications in the right breast. RECOMMENDATION: Diagnostic mammogram of the right breast. (Code:[id number]-[id number]) The patient will be contacted regarding the findings, and additional imaging will be scheduled. BI-RADS CATEGORY  0: Incomplete. Need additional imaging evaluation and/or prior mammograms for comparison. Electronically Signed   By: Sherian Rein M.D.   On: 01/17/2016 13:11    CBC  Recent Labs Lab 01/25/16 1905 01/27/16 0710  WBC 12.8* 9.3  HGB 13.2 12.6  HCT 39.4 38.5  PLT 164 179  MCV 86.0 87.3  MCH 28.8 28.6  MCHC 33.5 32.7  RDW 14.4 14.6  LYMPHSABS 0.9  --   MONOABS 0.7  --   EOSABS 0.1  --   BASOSABS 0.0  --     Chemistries   Recent Labs Lab 01/24/16 0801 01/25/16 1905 01/25/16 2217 01/27/16 0710  NA 134* 132*  --  135  K 3.5 3.4*  --  3.8  CL 95* 95*  --  99*  CO2 23 25  --  28  GLUCOSE 117* 205*  --  129*  BUN 13 16  --  17  CREATININE 0.48* 0.53  --  0.47  CALCIUM 9.1 8.5*  --  8.4*  MG  --   --  1.7  --   AST  --  24  --   --   ALT  --  16  --   --   ALKPHOS  --  116  --   --   BILITOT  --  1.3*  --   --    ------------------------------------------------------------------------------------------------------------------ estimated creatinine clearance is 56.5 mL/min (by C-G formula based on Cr of 0.47). ------------------------------------------------------------------------------------------------------------------  Recent Labs  01/26/16 0705  HGBA1C 7.1*   ------------------------------------------------------------------------------------------------------------------  Recent Labs  01/26/16 0705  CHOL 80  HDL 24*  LDLCALC 45    TRIG 56  CHOLHDL 3.3   ------------------------------------------------------------------------------------------------------------------  Recent Labs  01/26/16 0705  TSH 0.503   ------------------------------------------------------------------------------------------------------------------  Recent Labs  01/26/16 0705  VITAMINB12 426  FOLATE 13.1    Coagulation profile  Recent Labs Lab 01/25/16 1905 01/26/16 0705 01/27/16 0710 01/28/16 0650  INR 2.44* 2.90* 3.21* 2.46*    No results for input(s): DDIMER in the last 72 hours.  Cardiac Enzymes  Recent Labs Lab 01/25/16 1905  TROPONINI <0.03   ------------------------------------------------------------------------------------------------------------------ Invalid input(s): POCBNP  No results for input(s): GLUCAP in the last 72 hours.   Aubrey Voong M.D. [facility] 01/28/2016, 2:49 PM  Pager: [phone] Between 7am to 7pm - call Pager - [phone]  After 7pm go to [email or web address] - password [email or web address]  Call night coverage person covering after 7pm

## 2016-01-28 NOTE — Progress Notes (Signed)
ANTICOAGULATION CONSULT NOTE   Pharmacy Consult for Coumadin Indication: atrial fibrillation  No Known Allergies  Patient Measurements: Height:  (162.6 cm) Weight: 143 lb 15.4 oz (65.3 kg) IBW/kg (Calculated) : 54.7  Vital Signs: Temp: 98.7 F (37.1 C) (03/07 0745) Temp Source: Oral (03/07 0745) BP: 126/59 mmHg (03/07 0745) Pulse Rate: 62 (03/07 0745)  Labs:  Recent Labs  01/25/16 1905 01/26/16 0705 01/27/16 0710 01/28/16 0650  HGB 13.2  --  12.6  --   HCT 39.4  --  38.5  --   PLT 164  --  179  --   LABPROT 26.2* 29.8* 32.2* 26.4*  INR 2.44* 2.90* 3.21* 2.46*  CREATININE 0.53  --  0.47  --   TROPONINI <0.03  --   --   --    Estimated Creatinine Clearance: 56.5 mL/min (by C-G formula based on Cr of 0.47).  Medical History: Past Medical History  Diagnosis Date  . Down syndrome   . Essential hypertension   . Systolic dysfunction     LVEF 16%, indeterminate diastolic function  . Chronic atrial fibrillation (HCC)   . Pneumonia   . Pulmonary embolism (HCC)   . Leg fracture   . Pulmonary hypertension (HCC)     Moderate  . Right ventricular dysfunction     Medications:  Prescriptions prior to admission  Medication Sig Dispense Refill Last Dose  . alendronate (FOSAMAX) 70 MG tablet Take 70 mg by mouth once a week. Takes on Mondays. Take with a full glass of water on an empty stomach.   Past Week at Unknown time  . betamethasone dipropionate (DIPROLENE) 0.05 % cream Apply 1 application topically 2 (two) times daily as needed (for itching).   unknown  . Calcium Carbonate (CALCIUM 600 PO) Take 600 mg by mouth daily.   01/25/2016 at 800a  . digoxin (LANOXIN) 0.25 MG tablet Take 250 mcg by mouth daily.   01/24/2016 at 800a  . diltiazem (CARDIZEM) 60 MG tablet Take 1 tablet (60 mg total) by mouth 3 (three) times daily. 90 tablet 3 01/24/2016 at 1400  . furosemide (LASIX) 40 MG tablet Take 80 mg in the morning and 40 mg in the evening (Patient taking differently: Take  40-80 mg by mouth 2 (two) times daily. Take 80 mg in the morning and 40 mg in the evening) 90 tablet 3 01/24/2016 at Unknown time  . glipiZIDE (GLUCOTROL) 5 MG tablet Take 1 tablet (5 mg total) by mouth 2 (two) times daily before a meal. HOLD IF BLOOD SUGAR IS LESS THAN 115.   01/24/2016 at 800  . lisinopril (PRINIVIL,ZESTRIL) 5 MG tablet Take 5 mg by mouth daily.   01/24/2016 at 800a  . metFORMIN (GLUCOPHAGE-XR) 500 MG 24 hr tablet Take 1 tablet (500 mg total) by mouth daily. DO NOT START UNTIL 12/28/2015.   01/24/2016 at 800a  . metoprolol tartrate (LOPRESSOR) 25 MG tablet Take 25 mg by mouth 2 (two) times daily.   01/23/2016 at 800  . triamcinolone cream (KENALOG) 0.1 % Apply 1 application topically 2 (two) times daily.   01/25/2016 at 800a  . Vitamin D, Cholecalciferol, 400 UNITS TABS Take 400 Units by mouth daily.   01/25/2016 at 800a  . warfarin (COUMADIN) 2 MG tablet Take 1-2 tablets by mouth See admin instructions. Take 1 tablet by mouth on Tuesday, Thursday and Saturday.  Take (1/2) tablet by mouth on Sunday, Monday, Wednesday, and Friday.   01/25/2016 at 1700  . warfarin (COUMADIN) 6 MG  tablet Take 6 mg by mouth every evening.   01/24/2016 at 1700   Assessment: 70 yo female admitted N/V, slurred speech, difficulty ambulating. Admitted for TIA workup Continuation of coumadin PTA for AFIB. Dose at home was 7mg  and reduced to 4mg  per report. Coumadin was held on 3/6 due to elevated INR    Goal of Therapy:  INR 2-3 Monitor platelets by anticoagulation protocol: Yes   Plan:  Coumadin 5mg  today x 1 (INR trended back down to therapeutic range) INR/PT daily Monitor for s/s of bleeding  Valrie HartScott Srinika Delone, PharmD Clinical Pharmacist  01/28/2016,11:51 AM

## 2016-01-28 NOTE — Plan of Care (Signed)
Problem: Acute Rehab PT Goals(only PT should resolve) Goal: Pt Will Go Sit To Supine/Side Pt will demonstrate ModI bed mobility supine to sitting edge-of-bed to return to PLOF and to decrease caregiver burden.     Goal: Pt Will Transfer Bed To Chair/Chair To Bed Pt will transfer sit to/from-stand with RW, cam rocker on LLE, LLE NWB, and minA fort good safety awareness for independent mobility in home.     Goal: Pt Will Ambulate Pt will ambulate with RW at Supervision using a R hop-to pattern and LLE NWB for a distance greater than 2900ft to demonstrate the ability to perform safe household distance ambulation at discharge.

## 2016-01-28 NOTE — Progress Notes (Signed)
EEG Completed; Results Pending  

## 2016-01-28 NOTE — Progress Notes (Signed)
Inpatient Diabetes Program Recommendations  AACE/ADA: New Consensus Statement on Inpatient Glycemic Control (2015)  Target Ranges:  Prepandial:   less than 140 mg/dL      Peak postprandial:   less than 180 mg/dL (1-2 hours)      Critically ill patients:  140 - 180 mg/dL   Results for Angel Torres, Angel Torres (MRN [id number]) as of 01/28/2016 09:51  Ref. Range 01/25/2016 19:05 01/27/2016 07:10  Glucose Latest Ref Range: 65-99 mg/dL 409205 (H) 811129 (H)   Home DM Meds: Glipizide 5 mg bid       Metformin 500mg  daily  Current Insulin Orders: None    MD- Please consider placing order for Novolog Sensitive Correction Scale/ SSI (0-9 units) TID AC + HS  Please also consider changing PO diet to Carbohydrate Modified diet (currently ordered as Regular diet)     --Will follow patient during hospitalization--  Ambrose FinlandJeannine Johnston Urban Naval RN, MSN, CDE Diabetes Coordinator Inpatient Glycemic Control Team Team Pager: [phone] (8a-5p)

## 2016-01-28 NOTE — Evaluation (Signed)
Physical Therapy Evaluation Patient Details Name: Angel Torres MRN: [id number] DOB: 08/11/1946 Today's Date: 01/28/2016   History of Present Illness  70yo white female come to [facility] after sudden onset HA and nausea while eating dinner at [facility]. PMH: afib, DM, CHF, Dia HF, Sys HF, L tibial plateau fracture, unconfirmed Down's syndrome. Upon admission imaging reveals LLE displaces medial malleolus fracture. 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   Clinical Impression  At evaluation, pt is received semirecumbent in bed upon entry, family/caregiver present. The pt is awake and agreeable to participate. No acute distress noted at this time. The pt is pleasant, conversational, and following simple and multi-step commands consistently. Detailed history is taken from sister in law. Pt grip strength is strong and symmetrical; global strength as screened during functional mobility assessment presents with moderate to servere impairment, the pt now requiring physical assistance for transfers with difficulty maintaining upright stance, whereas the patient performs these indep at baseline. Of note: pt also unable to maintain WB status in current state, but performs TTWB in cam rocker on LLE with attempted transfers.O2 sats WNL on room air.   Patient presenting with impairment of strength, range of motion, balance, and activity tolerance, limiting ability to perform ADL and mobility tasks at  baseline level of function. Patient will benefit from skilled intervention to address the above impairments and limitations, in order to restore to prior level of function, improve patient safety upon discharge, and to decrease falls risk.       Follow Up Recommendations SNF;Supervision for mobility/OOB    Equipment Recommendations  Other (comment)    Recommendations for Other Services       Precautions / Restrictions Precautions Precautions:  None Required Braces or Orthoses: Other Brace/Splint Other Brace/Splint: Cam Rocker to L ankle/foot at all times with frequent skin checks (per phone order)  Restrictions Weight Bearing Restrictions: Yes LLE Weight Bearing: Non weight bearing Other Position/Activity Restrictions: Taken verbally over the phone from Dr. Romeo Apple.       Mobility  Bed Mobility Overal bed mobility: Needs Assistance Bed Mobility: Supine to Sit;Sit to Supine     Supine to sit: Min assist Sit to supine: Min assist      Transfers Overall transfer level: Needs assistance Equipment used: 1 person hand held assist Transfers: Sit to/from Stand Sit to Stand: Mod assist         General transfer comment: Attempted three times, pt rises, is unable to balance self at EOB, TTWB on LLE, no increase in pain, but reports she feels too weak to fully vome to standing.   Ambulation/Gait                Stairs            Wheelchair Mobility    Modified Rankin (Stroke Patients Only)       Balance                                             Pertinent Vitals/Pain Pain Assessment: Faces Faces Pain Scale: Hurts a little bit Pain Location: LLE  Pain Intervention(s): Limited activity within patient's tolerance;Monitored during session    Home Living Family/patient expects to be discharged to:: Assisted living  Home Equipment: Walker - 4 wheels      Prior Function           Comments: Indep in ADL, facility AMB distances with rolator.      Hand Dominance   Dominant Hand: Right    Extremity/Trunk Assessment                         Communication   Communication: HOH  Cognition Arousal/Alertness: Awake/alert Behavior During Therapy: WFL for tasks assessed/performed Overall Cognitive Status: History of cognitive impairments - at baseline                      General Comments      Exercises        Assessment/Plan     PT Assessment Patient needs continued PT services  PT Diagnosis Difficulty walking;Generalized weakness   PT Problem List Decreased strength;Decreased range of motion;Decreased mobility;Decreased activity tolerance;Decreased balance;Decreased knowledge of use of DME;Cardiopulmonary status limiting activity;Decreased cognition;Pain  PT Treatment Interventions Gait training;Functional mobility training;Therapeutic activities;Patient/family education;Balance training;Therapeutic exercise   PT Goals (Current goals can be found in the Care Plan section) Acute Rehab PT Goals Patient Stated Goal: improve functional indep  PT Goal Formulation: With family 70 Time For Goal Achievement: 01/28/16 Potential to Achieve Goals: Fair    Frequency Min 5X/week   Barriers to discharge        Co-evaluation               End of Session Equipment Utilized During Treatment: Gait belt;Other (comment) (L cam rocker) Activity Tolerance: Patient tolerated treatment well;Patient limited by lethargy;Patient limited by fatigue Patient left: in bed;with call bell/phone within reach;with family/visitor present Nurse Communication: Mobility status;Weight bearing status    Functional Assessment Tool Used: Clinical Judgment  Functional Limitation: Mobility: Walking and moving around Mobility: Walking and Moving Around Current Status ([id number](G8978): At least 60 percent but less than 80 percent impaired, limited or restricted Mobility: Walking and Moving Around Goal Status [phone](G8979): At least 60 percent but less than 80 percent impaired, limited or restricted    Time: 1117-1145 PT Time Calculation (min) (ACUTE ONLY): 28 min   Charges:   PT Evaluation $PT Eval High Complexity: 1 Procedure PT Treatments $Therapeutic Activity: 8-22 mins   PT G Codes:   PT G-Codes **NOT FOR INPATIENT CLASS** Functional Assessment Tool Used: Clinical Judgment  Functional Limitation: Mobility: Walking and moving around Mobility: Walking  and Moving Around Current Status ([id number](G8978): At least 60 percent but less than 80 percent impaired, limited or restricted Mobility: Walking and Moving Around Goal Status [phone](G8979): At least 60 percent but less than 80 percent impaired, limited or restricted    9:45 PM, 01/28/2016 Rosamaria LintsAllan C Buccola, PT, DPT PRN Physical Therapist at [facility] License # [id number] [phone] (wireless)  [phone] (mobile)

## 2016-01-28 NOTE — Progress Notes (Signed)
SLP Cancellation Note  Patient Details Name: Angel ChalkSybil J Rosselli MRN: [id number] DOB: 1946-07-25   Cancelled treatment:       Reason Eval/Treat Not Completed: SLP screened, no needs identified, will sign off; Pt/family report no changes in cognition, speech improved, and pt initially stated she had some difficulty swallowing water. Family reports no difficulty and pt observed drinking ~60 mL water without incident.   Thank you,  Havery MorosDabney Porter, CCC-SLP [phone]    PORTER,DABNEY 01/28/2016, 2:07 PM

## 2016-01-29 DIAGNOSIS — I482 Chronic atrial fibrillation: Secondary | ICD-10-CM

## 2016-01-29 DIAGNOSIS — R112 Nausea with vomiting, unspecified: Secondary | ICD-10-CM | POA: Diagnosis not present

## 2016-01-29 DIAGNOSIS — Z7901 Long term (current) use of anticoagulants: Secondary | ICD-10-CM | POA: Diagnosis not present

## 2016-01-29 DIAGNOSIS — I509 Heart failure, unspecified: Secondary | ICD-10-CM

## 2016-01-29 DIAGNOSIS — E118 Type 2 diabetes mellitus with unspecified complications: Secondary | ICD-10-CM

## 2016-01-29 DIAGNOSIS — G459 Transient cerebral ischemic attack, unspecified: Secondary | ICD-10-CM | POA: Diagnosis not present

## 2016-01-29 LAB — GLUCOSE, CAPILLARY
Glucose-Capillary: 141 mg/dL — ABNORMAL HIGH (ref 65–99)
Glucose-Capillary: 130 mg/dL — ABNORMAL HIGH (ref 65–99)
Glucose-Capillary: 121 mg/dL — ABNORMAL HIGH (ref 65–99)
Glucose-Capillary: 194 mg/dL — ABNORMAL HIGH (ref 65–99)

## 2016-01-29 LAB — CBC
HCT: 41.1 % (ref 36.0–46.0)
HEMOGLOBIN: 13.4 g/dL (ref 12.0–15.0)
MCH: 28.6 pg (ref 26.0–34.0)
MCHC: 32.6 g/dL (ref 30.0–36.0)
MCV: 87.6 fL (ref 78.0–100.0)
PLATELETS: 209 10*3/uL (ref 150–400)
RBC: 4.69 MIL/uL (ref 3.87–5.11)
RDW: 14.8 % (ref 11.5–15.5)
WBC: 9.4 10*3/uL (ref 4.0–10.5)

## 2016-01-29 LAB — BASIC METABOLIC PANEL
Anion gap: 9 (ref 5–15)
BUN: 13 mg/dL (ref 6–20)
CALCIUM: 8.8 mg/dL — AB (ref 8.9–10.3)
CO2: 28 mmol/L (ref 22–32)
CREATININE: 0.39 mg/dL — AB (ref 0.44–1.00)
Chloride: 98 mmol/L — ABNORMAL LOW (ref 101–111)
GFR calc Af Amer: >60 mL/min (ref >60)
GFR calc non Af Amer: >60 mL/min (ref >60)
GLUCOSE: 146 mg/dL — AB (ref 65–99)
Potassium: 3.8 mmol/L (ref 3.5–5.1)
Sodium: 135 mmol/L (ref 135–145)

## 2016-01-29 LAB — PROTIME-INR
INR: 2.24 — ABNORMAL HIGH (ref 0.00–1.49)
PROTHROMBIN TIME: 24.5 s — AB (ref 11.6–15.2)

## 2016-01-29 LAB — HEMOGLOBIN A1C
HEMOGLOBIN A1C: 7.1 % — AB (ref 4.8–5.6)
Mean Plasma Glucose: 157 mg/dL

## 2016-01-29 MED ORDER — MECLIZINE HCL 12.5 MG PO TABS: 12.5000 mg | ORAL_TABLET | Freq: Three times a day (TID) | ORAL | Status: AC

## 2016-01-29 MED ORDER — STROKE: EARLY STAGES OF RECOVERY BOOK
Freq: Once | Status: AC
  Administered 2016-01-29: 1
  Filled 2016-01-29: qty 1

## 2016-01-29 MED ORDER — OXYCODONE-ACETAMINOPHEN 5-325 MG PO TABS: 1.0000 | ORAL_TABLET | ORAL | Status: AC | PRN

## 2016-01-29 MED ORDER — HYDRALAZINE HCL 20 MG/ML IJ SOLN
10.0000 mg | Freq: Once | INTRAMUSCULAR | Status: AC
  Administered 2016-01-29: 10 mg via INTRAVENOUS
  Filled 2016-01-29: qty 1

## 2016-01-29 NOTE — Care Management Note (Signed)
Case Management Note  Patient Details  Name: Angel Torres MRN: [id number] Date of Birth: 29-Mar-1946  Subjective/Objective:   CSW following for [facility]. [facility] arrangements mad with [facility] per facility preference. Wheelchair DME order placed.               Action/Plan:[facility]  Expected Discharge Date:                  Expected Discharge Plan:  Assisted Living / Rest Home  In-House Referral:  Clinical Social Work  Discharge planning Services  CM Consult  Post Acute Care Choice:    Choice offered to:  NA  DME Arranged:  Government social research officerWheelchair manual DME Agency:  [facility]  [facility]  [facility]  Status of Service:  In process, will continue to follow  Medicare Important Message Given:    Date Medicare IM Given:    Medicare IM give by:    Date Additional Medicare IM Given:    Additional Medicare Important Message give by:     If discussed at Long Length of Stay Meetings, dates discussed:    Additional Comments:  Adonis HugueninBerkhead, Laurinda Carreno L, RN 01/29/2016, 11:21 AM

## 2016-01-29 NOTE — Progress Notes (Signed)
PT Cancellation Note  Patient Details Name: Angel ChalkSybil J Torres MRN: [id number] DOB: 08/17/1946   Cancelled Treatment:   PT [facility] with [facility]. Sit to stand transfer attempted 3x with 1 person HHA and 1 person to maintain LLE NWB. Pt unable to come to standing with Max, despite motivation to perform. Pt performed bed mobility supine <>sitting EOB with MinA for physical assistance. PT exited room with CSW, 3 representative of [place], pt's sister, sister-in-law, and brother still in room.    2:20 PM, 01/29/2016 Rosamaria LintsAllan C Buccola, PT, DPT PRN Physical Therapist [facility] License # [id number] [phone] (wireless)  [phone] (mobile)

## 2016-01-29 NOTE — NC FL2 (Signed)
[place] MEDICAID FL2 LEVEL OF CARE SCREENING TOOL     IDENTIFICATION  Patient Name: Angel Torres Birthdate: [birth year] Sex: female Admission Date (Current Location): 01/25/2016  [facility] and [place] Number:  Aaron Edelman  ([id number]) Facility and Address:  [facility],  [address], [place] [address]      Provider Number: [id number]  Attending Physician Name and Address:  Erick Blinks, MD  Relative Name and Phone Number:       Current Level of Care: Hospital Recommended Level of Care: Skilled Nursing Facility Prior Approval Number:    Date Approved/Denied:   PASRR Number:    Discharge Plan: SNF    Current Diagnoses: Patient Active Problem List   Diagnosis Date Noted  . TIA (transient ischemic attack) 01/26/2016  . Paroxysmal a-fib (HCC) 01/26/2016  . Chronic anticoagulation 01/26/2016  . Brain TIA 01/26/2016  . Nausea and vomiting in adult 01/25/2016  . Acute combined systolic and diastolic heart failure (HCC) 01/15/2016  . Pulmonary hypertension (HCC) 12/25/2015  . Acute on chronic systolic heart failure (HCC) 12/25/2015  . Cellulitis of right lower extremity 12/25/2015  . Recurrent right pleural effusion 12/24/2015  . Biventricular heart failure (HCC) 12/24/2015  . Type 2 diabetes mellitus (HCC) 12/24/2015  . Pleural effusion, right 12/07/2015  . Acute diastolic heart failure (HCC) 12/07/2015  . Chronic atrial fibrillation (HCC) 12/07/2015  . Down's syndrome 12/07/2015  . Essential hypertension 12/07/2015    Orientation RESPIRATION BLADDER Height & Weight     Self, Place  Normal Incontinent Weight: 143 lb 15.4 oz (65.3 kg) Height:   (162.6 cm)  BEHAVIORAL SYMPTOMS/MOOD NEUROLOGICAL BOWEL NUTRITION STATUS   (none )  (none) Continent Diet (Diet: Carb Modified )  AMBULATORY STATUS COMMUNICATION OF NEEDS Skin   Extensive Assist Verbally PU Stage and Appropriate Care                       Personal Care Assistance Level of  Assistance  Bathing, Feeding, Dressing Bathing Assistance: Limited assistance Feeding assistance: Independent Dressing Assistance: Limited assistance     Functional Limitations Info  Sight, Hearing, Speech Sight Info: Adequate Hearing Info: Adequate Speech Info: Adequate    SPECIAL CARE FACTORS FREQUENCY  PT (By licensed PT)     PT Frequency:  (5)              Contractures      Additional Factors Info  Code Status, Insulin Sliding Scale Code Status Info:  (Full Code. )     Insulin Sliding Scale Info:  (NovoLog Insulin Injections )       Current Medications (01/29/2016):  This is the current hospital active medication list Current Facility-Administered Medications  Medication Dose Route Frequency Provider Last Rate Last Dose  . calcium carbonate (OS-CAL - dosed in mg of elemental calcium) tablet 500 mg of elemental calcium  1 tablet Oral Q breakfast Ripudeep K Rai, MD   500 mg of elemental calcium at 01/29/16 0814  . digoxin (LANOXIN) tablet 250 mcg  250 mcg Oral Daily Houston Siren, MD   250 mcg at 01/29/16 1100  . diltiazem (CARDIZEM) tablet 60 mg  60 mg Oral TID Houston Siren, MD   60 mg at 01/29/16 1100  . furosemide (LASIX) tablet 40 mg  40 mg Oral Daily Ripudeep Jenna Luo, MD   40 mg at 01/29/16 1100  . insulin aspart (novoLOG) injection 0-5 Units  0-5 Units Subcutaneous QHS Ripudeep Jenna Luo, MD   0  Units at 01/28/16 2200  . insulin aspart (novoLOG) injection 0-9 Units  0-9 Units Subcutaneous TID WC Ripudeep Jenna LuoK Rai, MD   1 Units at 01/29/16 1222  . meclizine (ANTIVERT) tablet 12.5 mg  12.5 mg Oral TID Ripudeep K Rai, MD   12.5 mg at 01/29/16 1100  . oxyCODONE-acetaminophen (PERCOCET/ROXICET) 5-325 MG per tablet 1-2 tablet  1-2 tablet Oral Q4H PRN Houston SirenPeter Le, MD   2 tablet at 01/29/16 1432  . senna-docusate (Senokot-S) tablet 1 tablet  1 tablet Oral QHS PRN Houston SirenPeter Le, MD      . Warfarin - Pharmacist Dosing Inpatient   Does not apply Q24H Ripudeep Jenna LuoK Rai, MD         Discharge  Medications: Please see discharge summary for a list of discharge medications.  Relevant Imaging Results:  Relevant Lab Results:   Additional Information  (SSN: [id number])  Haig ProphetMorgan, Akaila Rambo G, LCSW

## 2016-01-29 NOTE — NC FL2 (Signed)
[place] MEDICAID FL2 LEVEL OF CARE SCREENING TOOL     IDENTIFICATION  Patient Name: Angel Torres Birthdate: 1946-02-27 Sex: female Admission Date (Current Location): 01/25/2016  [facility] [place]:  [place]  ([id number] L) Facility and Address:  [facility],  [address], [place] [id number]      Provider Number: [id number]  Attending Physician Name and Address:  Erick BlinksJehanzeb Memon, MD  Relative Name and Phone Number:       Current Level of Care: Hospital Recommended Level of Care: Assisted Living Facility Prior Approval Number:    Date Approved/Denied:   PASRR Number:    Discharge Plan:  [facility])    Current Diagnoses: Patient Active Problem List   Diagnosis Date Noted  . TIA (transient ischemic attack) 01/26/2016  . Paroxysmal a-fib (HCC) 01/26/2016  . Chronic anticoagulation 01/26/2016  . Brain TIA 01/26/2016  . Nausea and vomiting in adult 01/25/2016  . Acute combined systolic and diastolic heart failure (HCC) 01/15/2016  . Pulmonary hypertension (HCC) 12/25/2015  . Acute on chronic systolic heart failure (HCC) 12/25/2015  . Cellulitis of right lower extremity 12/25/2015  . Recurrent right pleural effusion 12/24/2015  . Biventricular heart failure (HCC) 12/24/2015  . Type 2 diabetes mellitus (HCC) 12/24/2015  . Pleural effusion, right 12/07/2015  . Acute diastolic heart failure (HCC) 12/07/2015  . Chronic atrial fibrillation (HCC) 12/07/2015  . Down's syndrome 12/07/2015  . Essential hypertension 12/07/2015    Orientation RESPIRATION BLADDER Height & Weight     Self, Place  Normal Incontinent Weight: 143 lb 15.4 oz (65.3 kg) Height:  5\' 4"  (162.6 cm)  BEHAVIORAL SYMPTOMS/MOOD NEUROLOGICAL BOWEL NUTRITION STATUS      Continent Diet (Carb Modified, low sodium, heart healthy, )  AMBULATORY STATUS COMMUNICATION OF NEEDS Skin   Limited Assist   Normal                       Personal Care Assistance  Level of Assistance  Bathing, Feeding, Dressing Bathing Assistance: Limited assistance Feeding assistance: Limited assistance Dressing Assistance: Limited assistance     Functional Limitations Info             SPECIAL CARE FACTORS FREQUENCY  PT (By licensed PT)     PT Frequency:  (5x/week)              Contractures      Additional Factors Info  Insulin Sliding Scale       Insulin Sliding Scale Info:  (3x daily)       Current Medications (01/29/2016):  This is the current hospital active medication list Current Facility-Administered Medications  Medication Dose Route Frequency Provider Last Rate Last Dose  . calcium carbonate (OS-CAL - dosed in mg of elemental calcium) tablet 500 mg of elemental calcium  1 tablet Oral Q breakfast Ripudeep K Rai, MD   500 mg of elemental calcium at 01/29/16 0814  . digoxin (LANOXIN) tablet 250 mcg  250 mcg Oral Daily Houston SirenPeter Le, MD   250 mcg at 01/29/16 1100  . diltiazem (CARDIZEM) tablet 60 mg  60 mg Oral TID Houston SirenPeter Le, MD   60 mg at 01/29/16 1100  . furosemide (LASIX) tablet 40 mg  40 mg Oral Daily Ripudeep Jenna LuoK Rai, MD   40 mg at 01/29/16 1100  . insulin aspart (novoLOG) injection 0-5 Units  0-5 Units Subcutaneous QHS Ripudeep K Rai, MD   0 Units at 01/28/16 2200  . insulin aspart (novoLOG) injection  0-9 Units  0-9 Units Subcutaneous TID WC Ripudeep Jenna Luo, MD   1 Units at 01/29/16 1222  . meclizine (ANTIVERT) tablet 12.5 mg  12.5 mg Oral TID Ripudeep K Rai, MD   12.5 mg at 01/29/16 1100  . oxyCODONE-acetaminophen (PERCOCET/ROXICET) 5-325 MG per tablet 1-2 tablet  1-2 tablet Oral Q4H PRN Houston Siren, MD   1 tablet at 01/28/16 2148  . senna-docusate (Senokot-S) tablet 1 tablet  1 tablet Oral QHS PRN Houston Siren, MD      . Warfarin - Pharmacist Dosing Inpatient   Does not apply Q24H Ripudeep Jenna Luo, MD         Discharge Medications: Please see discharge summary for a list of discharge medications.  Relevant Imaging Results:  Relevant Lab  Results:   Additional Information    Angel Torres, Angel China, LCSW

## 2016-01-29 NOTE — Clinical Social Work Placement (Signed)
   CLINICAL SOCIAL WORK PLACEMENT  NOTE  Date:  01/29/2016  Patient Details  Name: Angel ChalkSybil J Breit MRN: [id number] Date of Birth: [birth year]  Clinical Social Work is seeking post-discharge placement for this patient at the Skilled  Nursing Facility level of care (*CSW will initial, date and re-position this form in  chart as items are completed):  Yes   Patient/family provided with [facility] Clinical Social Work Department's list of facilities offering this level of care within the geographic area requested by the patient (or if unable, by the patient's family).  Yes   Patient/family informed of their freedom to choose among providers that offer the needed level of care, that participate in Medicare, Medicaid or managed care program needed by the patient, have an available bed and are willing to accept the patient.  Yes   Patient/family informed of [facility]'s ownership interest in [facility] and [facility], as well as of the fact that they are under no obligation to receive care at these facilities.  PASRR submitted to EDS on 01/29/16     PASRR number received on       Existing PASRR number confirmed on       FL2 transmitted to all facilities in geographic area requested by pt/family on 01/29/16     FL2 transmitted to all facilities within larger geographic area on       Patient informed that his/her managed care company has contracts with or will negotiate with certain facilities, including the following:        Yes   Patient/family informed of bed offers received.  Patient chooses bed at       Physician recommends and patient chooses bed at      Patient to be transferred to   on  .  Patient to be transferred to facility by       Patient family notified on   of transfer.  Name of family member notified:        PHYSICIAN       Additional Comment:    _______________________________________________ Haig ProphetMorgan, Bonney Berres G, LCSW 01/29/2016, 5:15 PM

## 2016-01-29 NOTE — Progress Notes (Signed)
Physical Therapy Treatment Patient Details Name: Angel ChalkSybil J Torres MRN: [id number] DOB: [birth year] Today's Date: 01/29/2016    History of Present Illness      PT Comments    Pt semirecumbent in bed upon therapist entrance, pt willing to participate with therapy today.  Pt reports minimal pain, unable to rate pain scale VAS based on face expressions today.  Session focus on improving awareness and instructing techniques for NWB transfer training.  Multimodal cueing with extreme difficulty following commands.  Transfer training with inability to keep Lt LE NWB, did complete with TTWB to chair.  Did not attempt gait training this session as inability to follow NWB.  Seated LE strengthening exercises were complete with demonstration and verbal cueing for technique.  Pt. Left in chair with chair alarm set, call bell within reach and sister in room.  No reports of pain at end of session.    Follow Up Recommendations        Equipment Recommendations       Recommendations for Other Services       Precautions / Restrictions Precautions Precautions: Fall Required Braces or Orthoses: Other Brace/Splint Other Brace/Splint: Cam Rocker to L ankle/foot at all times with frequent skin checks (per phone order)  Restrictions Weight Bearing Restrictions: Yes LLE Weight Bearing: Non weight bearing Other Position/Activity Restrictions: Taken verbally over the phone from Dr. Romeo AppleHarrison.     Mobility  Bed Mobility Overal bed mobility: Needs Assistance Bed Mobility: Supine to Sit     Supine to sit: Min assist     General bed mobility comments: cueing for handplacement to assist  Transfers Overall transfer level: Needs assistance Equipment used: 1 person hand held assist;Rolling walker (2 wheeled) Transfers: Sit to/from Stand Sit to Stand: Mod assist         General transfer comment: Attempted three times, pt rises, is unable to balance self at EOB, TTWB on LLE, no increase in pain, mod  assistance for handplacement to assist with sit to standing  Ambulation/Gait                 Stairs            Wheelchair Mobility    Modified Rankin (Stroke Patients Only)       Balance                                    Cognition Arousal/Alertness: Awake/alert Behavior During Therapy: WFL for tasks assessed/performed Overall Cognitive Status: History of cognitive impairments - at baseline                      Exercises General Exercises - Lower Extremity Ankle Circles/Pumps: AROM;Both;10 reps;Seated Long Arc Quad: AROM;Both;10 reps;Seated    General Comments        Pertinent Vitals/Pain Pain Assessment: Faces Faces Pain Scale: Hurts a little bit Pain Location: Lt LE  Pain Intervention(s): Limited activity within patient's tolerance;Monitored during session    Home Living                      Prior Function            PT Goals (current goals can now be found in the care plan section) Acute Rehab PT Goals Patient Stated Goal: improve functional indep  PT Goal Formulation: With family Time For Goal Achievement: 01/28/16 Potential to Achieve Goals: Fair Progress towards PT  goals: Progressing toward goals    Frequency       PT Plan Current plan remains appropriate    Co-evaluation             End of Session Equipment Utilized During Treatment: Gait belt (Lt cam boot) Activity Tolerance: Patient tolerated treatment well;Patient limited by lethargy;Patient limited by fatigue Patient left: in chair;with call bell/phone within reach;with chair alarm set;with family/visitor present     Time: 1010-1033   Charges:    1 TE   1 TA                   G Codes:      Becky Sax, LPTA; CBIS [phone]  Juel Burrow 01/29/2016, 3:06 PM

## 2016-01-29 NOTE — Progress Notes (Signed)
Patient is very diaphoretic.  BP is 145-114 and was taken 3 times with no changes.  MD notified.  Orders received.  Will follow orders and continue to monitor patient.

## 2016-01-29 NOTE — Procedures (Signed)
[facility] Kimiyo Carmicheal A. Gerilyn Pilgrim, MD     [email or web address]           HISTORY: The patient is a 70 year old female who presents with episode of confusion and focal neurological symptoms. This is being done to evaluate for seizures as a etiology of these episodes.  MEDICATIONS: Scheduled Meds: . calcium carbonate  1 tablet Oral Q breakfast  . digoxin  250 mcg Oral Daily  . diltiazem  60 mg Oral TID  . furosemide  40 mg Oral Daily  . insulin aspart  0-5 Units Subcutaneous QHS  . insulin aspart  0-9 Units Subcutaneous TID WC  . meclizine  12.5 mg Oral TID  . Warfarin - Pharmacist Dosing Inpatient   Does not apply Q24H   Continuous Infusions:  PRN Meds:.oxyCODONE-acetaminophen, senna-docusate  Prior to Admission medications   Medication Sig Start Date End Date Taking? Authorizing Provider  alendronate (FOSAMAX) 70 MG tablet Take 70 mg by mouth once a week. Takes on Mondays. Take with a full glass of water on an empty stomach.   Yes Historical Provider, MD  betamethasone dipropionate (DIPROLENE) 0.05 % cream Apply 1 application topically 2 (two) times daily as needed (for itching).   Yes Historical Provider, MD  Calcium Carbonate (CALCIUM 600 PO) Take 600 mg by mouth daily.   Yes Historical Provider, MD  digoxin (LANOXIN) 0.25 MG tablet Take 250 mcg by mouth daily.   Yes Historical Provider, MD  diltiazem (CARDIZEM) 60 MG tablet Take 1 tablet (60 mg total) by mouth 3 (three) times daily. 12/27/15  Yes Elliot Cousin, MD  furosemide (LASIX) 40 MG tablet Take 80 mg in the morning and 40 mg in the evening Patient taking differently: Take 40-80 mg by mouth 2 (two) times daily. Take 80 mg in the morning and 40 mg in the evening 01/01/16  Yes Jonelle Sidle, MD  glipiZIDE (GLUCOTROL) 5 MG tablet Take 1 tablet (5 mg total) by mouth 2 (two) times daily before a meal. HOLD IF BLOOD SUGAR IS LESS THAN 115. 12/27/15  Yes Elliot Cousin, MD  lisinopril (PRINIVIL,ZESTRIL) 5 MG tablet Take 5 mg  by mouth daily.   Yes Historical Provider, MD  metFORMIN (GLUCOPHAGE-XR) 500 MG 24 hr tablet Take 1 tablet (500 mg total) by mouth daily. DO NOT START UNTIL 12/28/2015. 12/27/15  Yes Elliot Cousin, MD  metoprolol tartrate (LOPRESSOR) 25 MG tablet Take 25 mg by mouth 2 (two) times daily.   Yes Historical Provider, MD  triamcinolone cream (KENALOG) 0.1 % Apply 1 application topically 2 (two) times daily.   Yes Historical Provider, MD  Vitamin D, Cholecalciferol, 400 UNITS TABS Take 400 Units by mouth daily.   Yes Historical Provider, MD  warfarin (COUMADIN) 2 MG tablet Take 1-2 tablets by mouth See admin instructions. Take 1 tablet by mouth on Tuesday, Thursday and Saturday.  Take (1/2) tablet by mouth on Sunday, Monday, Wednesday, and Friday. 12/02/15  Yes Historical Provider, MD  warfarin (COUMADIN) 6 MG tablet Take 6 mg by mouth every evening.   Yes Historical Provider, MD      ANALYSIS: A 16 channel recording using standard 10 20 measurements is conducted for 21 minutes. There is a well-formed posterior dominant rhythm of 8-1/2 Hz which attenuates with eye opening. There is beta activity observed in the frontal areas. Awake and sleep activities are observed. Photic stimulation and hyperventilation were not carried out. There is no focal or lateralized slowing. There is no epileptiform activity is observed.   IMPRESSION:  This is a normal recording of awake and sleep states.      Chelise Hanger A. Gerilyn Pilgrimoonquah, M.D.  Diplomate, Biomedical engineerAmerican Board of Psychiatry and Neurology ( Neurology).

## 2016-01-29 NOTE — Discharge Summary (Signed)
Physician Discharge Summary  Angel Torres WUJ:[id number] DOB: [birth year] DOA: 01/25/2016  PCP: [facility]  Admit date: 01/25/2016 Discharge date: 01/29/2016  Time spent: 35 minutes  Recommendations for Outpatient Follow-up:  1. Discharge to ALF with HH 2. Follow up Dr. Corliss Skains at [facility] tomorrow.  3. Follow up with Dr. Romeo Apple in 1 week. Patient will need xrays of left ankle prior to visit.   Discharge Diagnoses:  Principal Problem:   TIA (transient ischemic attack) Active Problems:   Chronic atrial fibrillation (HCC)   Biventricular heart failure (HCC)   Type 2 diabetes mellitus (HCC)   Pulmonary hypertension (HCC)   Acute combined systolic and diastolic heart failure (HCC)   Nausea and vomiting in adult   Chronic anticoagulation   Brain TIA Left ankle fracture  Discharge Condition: Improved  Diet recommendation: Heart healthy   Filed Weights   01/25/16 1848 01/26/16 0147 01/26/16 0300  Weight: 68.04 kg (150 lb) 65.363 kg (144 lb 1.6 oz) 65.3 kg (143 lb 15.4 oz)    History of present illness:  70 y.o. female with a hx of HTN, atrial fibrillation, and diastolic CHF that presented from [facility] with emesis. Per family members, patient was eating dinner when she stated that she had a headache and began vomiting. While en route provided IV Zofran with improvement however she still reports not feeling well. Per family, patient's speech noted to be slurred and state that she has been unable to ambulate at her baseline today. She has been complaining of left ankle pain and swelling recently. They are unable to give specific last known well time. While in the ED, vital signs were stable. Labs revelead a unremarkable BMP, negative troponin and lactic acid, BNP 432, and WBC 12.8. EKG, CXR, and AAS were unremarkable and head CT did not show any acute changes. Hospitalist was asked to admit for TIA workup  Hospital Course:  Patient was admitted for vertigo and  to rule out TIA/posterior circulation CVA. It was noted on admission that brother reports patient complained of vertigo and noticed slurred speech. MRI brain with no acute abnormality and MRA revealed small 3mm supraclinoid left ICA aneurysm; Patient was seen by neurology who recommended neuro endovascular follow up. Appointment has been scheduled for patient to see Dr. Corliss Skains in the endovascular clinic tomorrow. ECHO results as below. She will continue on warfarin for stroke prevention.   Chronic atrial fibrillation, rate controlled. [place] vasc 4. Continue warfarin.   Chronic biventricular heart failure. Compensated. ECHO EF of 35-40% grade 2 diastolic dysfunction.no signs of decompensation. Continue lasix, bb and ace   DM Type 2. Placed SSI. Hgb A1c 7.1, continue home regimen on discharge  Let ankle fracture. X-ray revealed minimally displaced fracture of the medial malleolus. Orthopedics consulted, she was found to not be a surgical candidate based on current medical situation. Cam walker boot placed. She will be discharged back to ALF with [facility]. She will follow up with orthopedics in 1 week.  Procedures:  ECHO Study Conclusions  - Left ventricle: The cavity size was normal. There was mild focal basal hypertrophy of the septum. Systolic function was moderately reduced. The estimated ejection fraction was in the range of 35% to 40%. Diffuse hypokinesis. Features are consistent with a pseudonormal left ventricular filling pattern, with concomitant abnormal relaxation and increased filling pressure (grade 2 diastolic dysfunction). - Ventricular septum: The contour showed diastolic flattening. - Aortic valve: Trileaflet; mildly thickened, mildly calcified leaflets. - Mitral valve: Calcified annulus.  Moderately thickened leaflets . - Left atrium: The atrium was severely dilated. - Right ventricle: The cavity size was mildly dilated. Wall thickness was normal. Systolic  function was moderately reduced. - Right atrium: The atrium was mildly dilated. - Tricuspid valve: There was moderate regurgitation. - Pulmonary arteries: Systolic pressure was severely increased. PA peak pressure: 70 mm Hg (S).  Carotid Doppler  Consultations:  Orthopedic Surgery   Neurology  Discharge Exam: Filed Vitals:   01/29/16 0345 01/29/16 0745  BP: 142/79 128/66  Pulse: 77 96  Temp: 98 F (36.7 C) 97.9 F (36.6 C)  Resp: 20 20    General: NAD. Sitting up in room chair with legs elevated.  Cardiovascular: RRR, S1, S2  Respiratory: cta b Abdomen: soft, non tender, no distention , bowel sounds normal Musculoskeletal: No edema b/l  Discharge Instructions   Discharge Instructions    Diet - low sodium heart healthy    Complete by:  As directed      Face-to-face encounter (required for Medicare/Medicaid patients)    Complete by:  As directed   [name] certify that this patient is under my care and that I, or a nurse practitioner or physician's assistant working with me, had a face-to-face encounter that meets the physician face-to-face encounter requirements with this patient on 01/29/2016. The encounter with the patient was in whole, or in part for the following medical condition(s) which is the primary reason for home health care (List medical condition): left ankle fracture, vertigo  The encounter with the patient was in whole, or in part, for the following medical condition, which is the primary reason for home health care:  ankle fracture, vertigo  I certify that, based on my findings, the following services are medically necessary home health services:   Nursing Physical therapy    Reason for Medically Necessary Home Health Services:  Skilled Nursing- Skilled Assessment/Observation  My clinical findings support the need for the above services:  Pain interferes with ambulation/mobility  Further, I certify that my clinical findings support that this  patient is homebound due to:  Pain interferes with ambulation/mobility     Home Health    Complete by:  As directed   To provide the following care/treatments:   RN PT OT Home Health Aide Social work       Increase activity slowly    Complete by:  As directed      Wheelchair    Complete by:  As directed           Current Discharge Medication List    START taking these medications   Details  meclizine (ANTIVERT) 12.5 MG tablet Take 1 tablet (12.5 mg total) by mouth 3 (three) times daily. Qty: 30 tablet, Refills: 0    oxyCODONE-acetaminophen (PERCOCET/ROXICET) 5-325 MG tablet Take 1-2 tablets by mouth every 4 (four) hours as needed for moderate pain or severe pain. Qty: 30 tablet, Refills: 0      CONTINUE these medications which have NOT CHANGED   Details  alendronate (FOSAMAX) 70 MG tablet Take 70 mg by mouth once a week. Takes on Mondays. Take with a full glass of water on an empty stomach.    betamethasone dipropionate (DIPROLENE) 0.05 % cream Apply 1 application topically 2 (two) times daily as needed (for itching).    Calcium Carbonate (CALCIUM 600 PO) Take 600 mg by mouth daily.    digoxin (LANOXIN) 0.25 MG tablet Take 250 mcg by mouth daily.    diltiazem (CARDIZEM) 60 MG  tablet Take 1 tablet (60 mg total) by mouth 3 (three) times daily. Qty: 90 tablet, Refills: 3    furosemide (LASIX) 40 MG tablet Take 80 mg in the morning and 40 mg in the evening Qty: 90 tablet, Refills: 3    glipiZIDE (GLUCOTROL) 5 MG tablet Take 1 tablet (5 mg total) by mouth 2 (two) times daily before a meal. HOLD IF BLOOD SUGAR IS LESS THAN 115.    lisinopril (PRINIVIL,ZESTRIL) 5 MG tablet Take 5 mg by mouth daily.    metFORMIN (GLUCOPHAGE-XR) 500 MG 24 hr tablet Take 1 tablet (500 mg total) by mouth daily. DO NOT START UNTIL 12/28/2015.    metoprolol tartrate (LOPRESSOR) 25 MG tablet Take 25 mg by mouth 2 (two) times daily.    triamcinolone cream (KENALOG) 0.1 % Apply 1 application  topically 2 (two) times daily.    Vitamin D, Cholecalciferol, 400 UNITS TABS Take 400 Units by mouth daily.    !! warfarin (COUMADIN) 2 MG tablet Take 1-2 tablets by mouth See admin instructions. Take 1 tablet by mouth on Tuesday, Thursday and Saturday.  Take (1/2) tablet by mouth on Sunday, Monday, Wednesday, and Friday.    !! warfarin (COUMADIN) 6 MG tablet Take 6 mg by mouth every evening.     !! - Potential duplicate medications found. Please discuss with provider.     No Known Allergies Follow-up Information    Follow up with Dr. Corliss Skains. Go on 01/30/2016.   Why:  arrive at 12:45 at [facility], main entrance, ask for radiology, call [phone] with questions   Contact information:   [address] [place], [place] [address]      Follow up with Fuller Canada, MD In 1 week.   Specialties:  Orthopedic Surgery, Radiology   Why:  xrays of left ankle before appointment   Contact information:   [address] [place] [address] [phone]        The results of significant diagnostics from this hospitalization (including imaging, microbiology, ancillary and laboratory) are listed below for reference.    Significant Diagnostic Studies: Dg Ankle Complete Left  Feb 17, 2016  CLINICAL DATA:  70 year old female with left ankle pain and swelling and redness. EXAM: LEFT ANKLE COMPLETE - 3+ VIEW COMPARISON:  Radiograph dated 07/16/2008 FINDINGS: There is a minimally displaced fracture of the medial malleolus. No other acute fracture identified. The bones are osteopenic which limits evaluation for fracture. There is mild narrowing of the medial aspect of the ankle mortise. There is diffuse subcutaneous edema with diffuse speckled skin calcification, likely related to chronic venous insufficiency. No radiopaque foreign object identified. IMPRESSION: Minimally displaced fracture of the medial malleolus. CT may provide better evaluation. Electronically Signed   By: Elgie Collard M.D.   On: 17-Feb-2016 01:37   Ct Head Wo Contrast  01/25/2016  CLINICAL DATA:  Slurred speech. EXAM: CT HEAD WITHOUT CONTRAST TECHNIQUE: Contiguous axial images were obtained from the base of the skull through the vertex without intravenous contrast. COMPARISON:  None FINDINGS: There is patchy low attenuation within the subcortical white matter bilaterally compatible with chronic microvascular disease. There is no abnormal extra-axial fluid collection, intracranial hemorrhage or mass identified. No evidence for acute brain infarct. There is mild scratch set bilateral maxillary sinus disease identified. There is opacification of the left mastoid air cells. The right mastoid air cells appear underdeveloped. The calvarium is intact. IMPRESSION: 1. Small vessel ischemic disease. 2. No acute intracranial abnormalities noted. 3. Left mastoid air  cell opacification. Mild bilateral maxillary sinus disease also noted. Electronically Signed   By: Signa Kell M.D.   On: 01/25/2016 23:15   Mr Shirlee Latch Wo Contrast  01/27/2016  CLINICAL DATA:  70 year old female with altered mental status, slurred speech, and weakness for 2 days. Initial encounter. EXAM: MRA HEAD WITHOUT CONTRAST TECHNIQUE: Angiographic images of the Circle of Willis were obtained using MRA technique without intravenous contrast. COMPARISON:  Brain MRI from today reported separately. Head CT without contrast 01/25/2016. FINDINGS: Antegrade flow in the posterior circulation with dominant appearing distal right vertebral artery. No distal vertebral artery stenosis. Patent vertebrobasilar junction. Mildly tortuous basilar artery without stenosis. Normal SCA and PCA origins. Normal right posterior communicating artery, the left is diminutive or absent. Bilateral PCA branches are within normal limits. Antegrade flow in both ICA siphons. ICA tortuosity without stenosis. The right ophthalmic and posterior communicating artery origins are within normal  limits. Just distal to the left ophthalmic artery origin arising in the supraclinoid ICA and directed superiorly there is a 3 mm saccular aneurysm (series 1, image 72 and series 109, image 35). Otherwise normal distal ICAs. Patent carotid termini. Mildly dominant right ACA A1 segment. MCA and ACA origins within normal limits. Anterior communicating artery and proximal ACA branches within normal limits. Mild motion artifact affecting the distal ACA branches. MCA M1 segments and bifurcations are within normal limits. Motion artifact affecting the visualized bilateral MCA branches. IMPRESSION: 1. 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 Recommend Neuro-endovascular follow-up. 2. No intracranial stenosis stenosis. Mild intracranial tortuosity and otherwise negative for age intracranial MRA. Electronically Signed   By: Odessa Fleming M.D.   On: 01/27/2016 08:52   Mr Brain Wo Contrast  01/27/2016  CLINICAL DATA:  70 year old female with altered mental status, slurred speech, and weakness for 2 days. Initial encounter. EXAM: MRI HEAD WITHOUT CONTRAST TECHNIQUE: Multiplanar, multiecho pulse sequences of the brain and surrounding structures were obtained without intravenous contrast. COMPARISON:  Head CT without contrast 01/25/2016 FINDINGS: Study is intermittently degraded by motion artifact despite repeated imaging attempts. No restricted diffusion or evidence of acute infarction. Major intracranial vascular flow voids are preserved. Cerebral volume is within normal limits for age. No midline shift. No ventriculomegaly. No acute intracranial hemorrhage identified. Negative pituitary and cervicomedullary junction. Evidence of a small right lateral convexity extra-axial mass encompassing 16 x 8 x 15 mm (AP by transverse by CC). This is most parent on diffusion-weighted imaging but also is visible on axial FLAIR and T1 imaging being nearly isointense to gray matter. The lesion appears mineralized on T2 * imaging (series 9, image  16) and was mildly hyperdense on the comparison CT. No other intracranial mass lesion identified. Bilateral scattered, patchy, and confluent nonspecific cerebral white matter T2 and FLAIR hyperintensity. No cortical encephalomalacia. Deep gray matter nuclei, brainstem, and cerebellum are within normal limits. Left mastoid effusion. Nasopharynx within normal limits. The left tympanic cavity min may also be partially opacified. Mild right maxillary sinus mucosal thickening. Grossly negative orbit and scalp soft tissues. 3.4 cm left parietal bone lesion with T2 hyperintensity (series 6, image 16, FLAIR hyperintensity, and mild intrinsic increased T1 signal (series 6, image 64). This has conspicuous increased diffusion signal. This appeared non expansile and circumscribed on the recent CT. Elsewhere bone marrow signal appears within normal limits. Partially visible degenerative cervical spine changes with mild to moderate degenerative spinal stenosis suspected at C3-C4. IMPRESSION: 1.  No acute intracranial abnormality. 2. Right lateral convexity 1.6 cm meningioma suspected,  unlikely clinically silent. 3. Moderate for age nonspecific white matter signal changes, most commonly due to chronic small vessel disease. 4. Left mastoid effusion with partial opacification of the left tympanic cavity, consider inflammatory or cholesteatoma etiology. 5. Degenerative cervical spinal stenosis at C3-C4. 6. Benign-appearing left parietal bone lesion, favor hemangioma. Electronically Signed   By: Odessa Fleming M.D.   On: 01/27/2016 08:47   US Carotid Bilateral  01/26/2016  CLINICAL DATA:  TIA.  Down syndrome. EXAM: BILATERAL CAROTID DUPLEX ULTRASOUND TECHNIQUE: Wallace Cullens scale imaging, color Doppler and duplex ultrasound was performed of bilateral carotid and vertebral arteries in the neck. COMPARISON:  None available REVIEW OF SYSTEMS: Quantification of carotid stenosis is based on velocity parameters that correlate the residual internal  carotid diameter with NASCET-based stenosis levels, using the diameter of the distal internal carotid lumen as the denominator for stenosis measurement. The following velocity measurements were obtained: PEAK SYSTOLIC/END DIASTOLIC RIGHT ICA:                     78/24cm/sec CCA:                     76/11cm/sec SYSTOLIC ICA/CCA RATIO:  1.1 DIASTOLIC ICA/CCA RATIO: 2.2 ECA:                     116cm/sec LEFT ICA:                     118/26cm/sec CCA:                     86/13cm/sec SYSTOLIC ICA/CCA RATIO:  1.4 DIASTOLIC ICA/CCA RATIO: 2.0 ECA:                     135cm/sec FINDINGS: RIGHT CAROTID ARTERY: Mild plaque at the bifurcation. No high-grade stenosis. Normal waveforms and color Doppler signal. RIGHT VERTEBRAL ARTERY: Normal flow direction and waveform. A nonspecific cardiac arrhythmia is noted. LEFT CAROTID ARTERY: Mild smooth noncalcified plaque in the carotid bulb extending into proximal internal and external carotid arteries. No high-grade stenosis. Normal waveforms and color Doppler signal. LEFT VERTEBRAL ARTERY: Normal flow direction and waveform. IMPRESSION: 1. Mild bilateral carotid bifurcation plaque resulting in less than 50% diameter stenosis. The exam does not exclude plaque ulceration or embolization. Continued surveillance recommended. 2. Nonspecific cardiac arrhythmia noted. Electronically Signed   By: Corlis Leak M.D.   On: 01/26/2016 10:18   Dg Tibia/fibula Left Port  01/27/2016  CLINICAL DATA:  Ankle fracture EXAM: PORTABLE LEFT TIBIA AND FIBULA - 2 VIEW COMPARISON:  None. FINDINGS: Mild diffuse osteopenia. Age in the determinate deformity involving the ankle is identified. Due to patient positioning exact details of the deformity is uncertain. Recommend further investigation with CT of the ankle. Chronic lateral tibial plateau fracture noted. IMPRESSION: 1. Age-indeterminate deformity involving the right ankle. Recommend further investigation with CT of the ankle. 2. Chronic lateral tibial  plateau fracture. Electronically Signed   By: Signa Kell M.D.   On: 01/27/2016 09:31   Dg Abd Acute W/chest  01/25/2016  CLINICAL DATA:  Nausea and vomiting beginning today. Atrial fibrillation. Systolic dysfunction. Hypertension. EXAM: DG ABDOMEN ACUTE W/ 1V CHEST COMPARISON:  12/25/2015 FINDINGS: There is no evidence of dilated bowel loops or free intraperitoneal air. No radiopaque calculi or other significant radiographic abnormality is seen. Moderate cardiomegaly stable. Small right pleural effusion and bibasilar atelectasis show no significant change. IMPRESSION: Unremarkable bowel gas pattern. Stable  cardiomegaly, small right pleural effusion, and bibasilar atelectasis. Electronically Signed   By: Myles RosenthalJohn  Stahl M.D.   On: 01/25/2016 21:00   Mm Digital Screening Bilateral  01/17/2016  CLINICAL DATA:  Screening. EXAM: DIGITAL SCREENING BILATERAL MAMMOGRAM WITH CAD COMPARISON:  Previous exam(s). ACR Breast Density Category b: There are scattered areas of fibroglandular density. FINDINGS: In the right breast, calcifications warrant further evaluation with magnified views. In the left breast, no findings suspicious for malignancy. Images were processed with CAD. IMPRESSION: Further evaluation is suggested for calcifications in the right breast. RECOMMENDATION: Diagnostic mammogram of the right breast. (Code:[id number]-[id number]) The patient will be contacted regarding the findings, and additional imaging will be scheduled. BI-RADS CATEGORY  0: Incomplete. Need additional imaging evaluation and/or prior mammograms for comparison. Electronically Signed   By: Sherian ReinWei-Chen  Lin M.D.   On: 01/17/2016 13:11    Microbiology: Recent Results (from the past 240 hour(s))  MRSA PCR Screening     Status: None   Collection Time: 01/26/16  2:22 AM  Result Value Ref Range Status   MRSA by PCR NEGATIVE NEGATIVE Final    Comment:        The GeneXpert MRSA Assay (FDA approved for NASAL specimens only), is one component of  a comprehensive MRSA colonization surveillance program. It is not intended to diagnose MRSA infection nor to guide or monitor treatment for MRSA infections.      Labs: Basic Metabolic Panel:  Recent Labs Lab 01/24/16 0801 01/25/16 1905 01/25/16 2217 01/27/16 0710 01/29/16 0618  NA 134* 132*  --  135 135  K 3.5 3.4*  --  3.8 3.8  CL 95* 95*  --  99* 98*  CO2 23 25  --  28 28  GLUCOSE 117* 205*  --  129* 146*  BUN 13 16  --  17 13  CREATININE 0.48* 0.53  --  0.47 0.39*  CALCIUM 9.1 8.5*  --  8.4* 8.8*  MG  --   --  1.7  --   --    Liver Function Tests:  Recent Labs Lab 01/25/16 1905  AST 24  ALT 16  ALKPHOS 116  BILITOT 1.3*  PROT 7.7  ALBUMIN 3.9    Recent Labs Lab 01/25/16 1905  LIPASE 29     Recent Labs Lab 01/25/16 1905 01/27/16 0710 01/29/16 0618  WBC 12.8* 9.3 9.4  NEUTROABS 10.9*  --   --   HGB 13.2 12.6 13.4  HCT 39.4 38.5 41.1  MCV 86.0 87.3 87.6  PLT 164 179 209   Cardiac Enzymes:  Recent Labs Lab 01/25/16 1905  TROPONINI <0.03   BNP: BNP (last 3 results)  Recent Labs  12/07/15 0752 12/24/15 1700 01/25/16 2220  BNP 760.0* 563.0* 432.0*    CBG:  Recent Labs Lab 01/28/16 2122 01/29/16 0801 01/29/16 1122  GLUCAP 170* 141* 130*       Signed:  Erick BlinksMemon, Tiarrah Saville, MD   [facility] 01/29/2016, 12:00 PM

## 2016-01-29 NOTE — Care Management (Signed)
Patient suffers from Ankle fracture which impairs their ability to perform daily activities like ambulating safely in the home.  A walker or cane will not resolve  issue with performing activities of daily living. A wheelchair will allow patient to safely perform daily activities. Patient can safely propel the wheelchair in the home or has a caregiver who can provide assistance.

## 2016-01-29 NOTE — Clinical Social Work Note (Signed)
Clinical Social Work Assessment  Patient Details  Name: Angel Torres MRN: [id number] Date of Birth: August 02, 1946  Date of referral:  01/29/16               Reason for consult:  Facility Placement, Other (Comment Required) (Patient is from [facility] )                Permission sought to share information with:  Facility Art therapist granted to share information::  Yes, Verbal Permission Granted  Name::      Angel Torres::     Relationship::     Contact Information:     Housing/Transportation Living arrangements for the past 2 months:  [place] of Information:  Power of Attorney Patient Interpreter Needed:  None Criminal Activity/Legal Involvement Pertinent to Current Situation/Hospitalization:  No - Comment as needed Significant Relationships:  Siblings Lives with:  Facility Resident Do you feel safe going back to the place where you live?  Yes Need for family participation in patient care:  Yes (Comment)  Care giving concerns:  Patient is a resident at [facility].    Social Worker assessment / plan:  Holiday representative (Angel Torres) received verbal consult from RN Case Manager that patient is Medicare Observation from an ALF and is ready for D/C today. CSW met with patient and her sister Angel Torres [phone] was at bedside. Patient has down syndrome and has lived at [place] for 15 years. Patient has an non-operable ankle fracture and is non-weight bearing. Per sister she would like for patient to go to rehab at a SNF. CSW explained that patient is under Medicare Observation and Medicare will not pay for SNF. Sister was then agreeable for patient to return to [facility].   CSW contacted [facility] and spoke with Occupational psychologist. CSW made Angel Torres aware that patient was having a hard time standing and per Angel Torres patient can still return to [facility] if RN Case Manager will order a wheel chair. RN Case Manager  ordered a wheel chair through [place]. Home Health PT, OT, RN and social work was also ordered. CSW then received a call from [place] the Mudlogger of [employer]. Per Angel Torres she will come to [facility] today and bring her staff to assess if patient can transfer to a wheel chair.   Angel Torres and staff from [facility] were at bedside when PT attempted to get patient to stand up. Patient could not stand up and transfer from the hospital bed to the wheel chair. Angel Torres then stated that patient will have to go to SNF before returning back to [facility].   CSW discussed SNF options with patient's sister and brother Angel Torres [phone]. CSW explained that patient will have to go to SNF under Medicaid and will have to stay 30 days. CSW also explained that Medicaid beds are limited and the search will have to extend outside [facility]. CSW also explained that a PASARR will have to be obtained. Family verbalized their understanding.   FL2 complete and faxed out. PASARR is pending, 30 day note was faxed to [facility] Must today. Bed offers received from [facility] in [place] and [facility] in [place]. CSW made family aware of bed offers. Family requested [facility] in [place]. Per Angel Torres admissions coordinator she will have to get approval from her administrator and has not given a bed offer at this time. CSW made family  aware that if [facility] does not offer a bed they will have to pick between [facility] and [facility]. CSW will continue to follow and assist as needed.   Employment status:  Disabled (Comment on whether or not currently receiving Disability) Insurance information:  Medicare, Medicaid In [employer] (Medicare Observation ) PT Recommendations:  [place] / Referral to community resources:  [place]  Patient/Family's Response to care:  Patient cannot return to [place] and SNF search in being initiated. PASARR is pending.   Patient/Family's  Understanding of and Emotional Response to Diagnosis, Current Treatment, and Prognosis:  Patient and family were pleasant throughout assessment.   Emotional Assessment Appearance:  Appears stated age Attitude/Demeanor/Rapport:    Affect (typically observed):  Pleasant Orientation:  Oriented to Self, Oriented to Place Alcohol / Substance use:  Not Applicable Psych involvement (Current and /or in the community):  No (Comment)  Discharge Needs  Concerns to be addressed:  Discharge Planning Concerns Readmission within the last 30 days:  No Current discharge risk:  Chronically ill, Dependent with Mobility Barriers to Discharge:  Other (PASARR pending)   Angel Freshwater, LCSW 01/29/2016, 5:18 PM

## 2016-01-30 ENCOUNTER — Ambulatory Visit (HOSPITAL_COMMUNITY): Payer: Medicare Other

## 2016-01-30 DIAGNOSIS — R112 Nausea with vomiting, unspecified: Secondary | ICD-10-CM | POA: Diagnosis not present

## 2016-01-30 DIAGNOSIS — S99919A Unspecified injury of unspecified ankle, initial encounter: Secondary | ICD-10-CM | POA: Diagnosis not present

## 2016-01-30 DIAGNOSIS — Z7401 Bed confinement status: Secondary | ICD-10-CM | POA: Diagnosis not present

## 2016-01-30 DIAGNOSIS — I5041 Acute combined systolic (congestive) and diastolic (congestive) heart failure: Secondary | ICD-10-CM

## 2016-01-30 LAB — PROTIME-INR
INR: 1.75 — AB (ref 0.00–1.49)
Prothrombin Time: 20.4 seconds — ABNORMAL HIGH (ref 11.6–15.2)

## 2016-01-30 LAB — GLUCOSE, CAPILLARY
Glucose-Capillary: 136 mg/dL — ABNORMAL HIGH (ref 65–99)
Glucose-Capillary: 147 mg/dL — ABNORMAL HIGH (ref 65–99)

## 2016-01-30 NOTE — Clinical Social Work Placement (Signed)
   CLINICAL SOCIAL WORK PLACEMENT  NOTE  Date:  01/30/2016  Patient Details  Name: Renetta ChalkSybil J Hitz MRN: [id number] Date of Birth: [birth year]  Clinical Social Work is seeking post-discharge placement for this patient at the Skilled  Nursing Facility level of care (*CSW will initial, date and re-position this form in  chart as items are completed):  Yes   Patient/family provided with [facility] Clinical Social Work Department's list of facilities offering this level of care within the geographic area requested by the patient (or if unable, by the patient's family).  Yes   Patient/family informed of their freedom to choose among providers that offer the needed level of care, that participate in Medicare, Medicaid or managed care program needed by the patient, have an available bed and are willing to accept the patient.  Yes   Patient/family informed of [facility]'s ownership interest in [facility] and [facility], as well as of the fact that they are under no obligation to receive care at these facilities.  PASRR submitted to [facility] on 01/29/16     PASRR number received on 01/30/16     Existing PASRR number confirmed on       FL2 transmitted to all facilities in geographic area requested by pt/family on 01/29/16     FL2 transmitted to all facilities within larger geographic area on       Patient informed that his/her managed care company has contracts with or will negotiate with certain facilities, including the following:        Yes   Patient/family informed of bed offers received.  [facility]     Physician recommends and patient chooses bed at      [facility].  Patient to be transferred to facility by  [facility]     Patient family notified on 01/30/16 of transfer.  Name of family member notified:   Bernette Redbird(Kenny and [name] (brother and sister))     PHYSICIAN       Additional Comment:     _______________________________________________ Annice NeedySettle, Henley Boettner D, LCSW 01/30/2016, 1:48 PM

## 2016-01-30 NOTE — Clinical Social Work Note (Signed)
CSW facilitated discharge.   CSW presented bed offers to patient's siblings, [name] and Bernette RedbirdKenny, who were at bedside. They chose [facility].  CSW discussed that patient would be discharging today and would be transported via [facility].  CSW notified Tresa EndoKelly at [facility] that patient had chosen the facility and would be discharged today.  CSW facilitated transport via [facility].  CSW signing off.   Annice NeedySettle, Gal Smolinski D, [place]CS[place] [phone]

## 2016-01-30 NOTE — Progress Notes (Signed)
[facility], [facility], family present and accompanied patient. Stable at discharge.

## 2016-01-30 NOTE — Discharge Summary (Signed)
Physician Discharge Summary  Angel Torres ZOX:[id number] DOB: November 13, 1946 DOA: 01/25/2016  PCP: [facility]  Admit date: 01/25/2016 Discharge date: 01/30/2016  Time spent: 35 minutes  Recommendations for Outpatient Follow-up:  1. Discharge to SNF  2. Follow up Dr. Corliss Skains at [facility] 3/14.  3. Follow up with Dr. Romeo Apple on 3/15. Patient will need xrays of left ankle prior to visit.   Discharge Diagnoses:  Principal Problem:   TIA (transient ischemic attack) Active Problems:   Chronic atrial fibrillation (HCC)   Biventricular heart failure (HCC)   Type 2 diabetes mellitus (HCC)   Pulmonary hypertension (HCC)   Acute combined systolic and diastolic heart failure (HCC)   Nausea and vomiting in adult   Chronic anticoagulation   Brain TIA Left ankle fracture  Discharge Condition: Improved  Diet recommendation: Heart healthy   Filed Weights   01/25/16 1848 01/26/16 0147 01/26/16 0300  Weight: 68.04 kg (150 lb) 65.363 kg (144 lb 1.6 oz) 65.3 kg (143 lb 15.4 oz)    History of present illness:  70 y.o. female with a hx of HTN, atrial fibrillation, and diastolic CHF that presented from [facility] with emesis. Per family members, patient was eating dinner when she stated that she had a headache and began vomiting. While en route provided IV Zofran with improvement however she still reports not feeling well. Per family, patient's speech noted to be slurred and state that she has been unable to ambulate at her baseline today. She has been complaining of left ankle pain and swelling recently. They are unable to give specific last known well time. While in the ED, vital signs were stable. Labs revelead a unremarkable BMP, negative troponin and lactic acid, BNP 432, and WBC 12.8. EKG, CXR, and AAS were unremarkable and head CT did not show any acute changes. Hospitalist was asked to admit for TIA workup  Hospital Course:  Patient was admitted for vertigo and to rule out  TIA/posterior circulation CVA. It was noted on admission that brother reports patient complained of vertigo and noticed slurred speech. MRI brain with no acute abnormality and MRA revealed small 3mm supraclinoid left ICA aneurysm; Patient was seen by neurology who recommended neuro endovascular follow up. Appointment has been scheduled for patient to see Dr. Corliss Skains in the endovascular clinic 3/14. ECHO results as below. She will continue on warfarin for stroke prevention.   Chronic atrial fibrillation, rate controlled. [place] vasc 4. Continue warfarin.   Chronic biventricular heart failure. Compensated. ECHO EF of 35-40% grade 2 diastolic dysfunction.no signs of decompensation. Continue lasix, bb and ace   DM Type 2. Placed SSI. Hgb A1c 7.1, continue home regimen on discharge  Let ankle fracture. X-ray revealed minimally displaced fracture of the medial malleolus. Orthopedics consulted, she was found to not be a surgical candidate based on current medical situation. Cam walker boot placed. She will be discharged back to ALF with [facility] She will follow up with orthopedics on 3/15.  No change in pt's condition since evaluation yesterday. PE and discharge medication will remain the same.   Procedures:  ECHO Study Conclusions  - Left ventricle: The cavity size was normal. There was mild focal basal hypertrophy of the septum. Systolic function was moderately reduced. The estimated ejection fraction was in the range of 35% to 40%. Diffuse hypokinesis. Features are consistent with a pseudonormal left ventricular filling pattern, with concomitant abnormal relaxation and increased filling pressure (grade 2 diastolic dysfunction). - Ventricular septum: The contour showed diastolic  flattening. - Aortic valve: Trileaflet; mildly thickened, mildly calcified leaflets. - Mitral valve: Calcified annulus. Moderately thickened leaflets . - Left atrium: The atrium was severely dilated. -  Right ventricle: The cavity size was mildly dilated. Wall thickness was normal. Systolic function was moderately reduced. - Right atrium: The atrium was mildly dilated. - Tricuspid valve: There was moderate regurgitation. - Pulmonary arteries: Systolic pressure was severely increased. PA peak pressure: 70 mm Hg (S).  Carotid Doppler  Consultations:  Orthopedic Surgery   Neurology  Discharge Exam: Filed Vitals:   01/29/16 2304 01/30/16 0304  BP: 103/82 154/70  Pulse: 76 95  Temp: 97.4 F (36.3 C) 97.5 F (36.4 C)  Resp: 20 20    General: NAD. Sitting up in room chair with legs elevated.  Cardiovascular: RRR, S1, S2  Respiratory: cta b Abdomen: soft, non tender, no distention , bowel sounds normal Musculoskeletal: No edema b/l  Discharge Instructions   Discharge Instructions    Diet - low sodium heart healthy    Complete by:  As directed      Face-to-face encounter (required for Medicare/Medicaid patients)    Complete by:  As directed   I Levonne Carreras certify that this patient is under my care and that I, or a nurse practitioner or physician's assistant working with me, had a face-to-face encounter that meets the physician face-to-face encounter requirements with this patient on 01/29/2016. The encounter with the patient was in whole, or in part for the following medical condition(s) which is the primary reason for home health care (List medical condition): left ankle fracture, vertigo  The encounter with the patient was in whole, or in part, for the following medical condition, which is the primary reason for home health care:  ankle fracture, vertigo  I certify that, based on my findings, the following services are medically necessary home health services:   Nursing Physical therapy    Reason for Medically Necessary Home Health Services:  Skilled Nursing- Skilled Assessment/Observation  My clinical findings support the need for the above services:  Pain interferes  with ambulation/mobility  Further, I certify that my clinical findings support that this patient is homebound due to:  Pain interferes with ambulation/mobility     Home Health    Complete by:  As directed   To provide the following care/treatments:   RN PT OT Home Health Aide Social work       Increase activity slowly    Complete by:  As directed      Wheelchair    Complete by:  As directed           Current Discharge Medication List    START taking these medications   Details  meclizine (ANTIVERT) 12.5 MG tablet Take 1 tablet (12.5 mg total) by mouth 3 (three) times daily. Qty: 30 tablet, Refills: 0    oxyCODONE-acetaminophen (PERCOCET/ROXICET) 5-325 MG tablet Take 1-2 tablets by mouth every 4 (four) hours as needed for moderate pain or severe pain. Qty: 30 tablet, Refills: 0      CONTINUE these medications which have NOT CHANGED   Details  alendronate (FOSAMAX) 70 MG tablet Take 70 mg by mouth once a week. Takes on Mondays. Take with a full glass of water on an empty stomach.    betamethasone dipropionate (DIPROLENE) 0.05 % cream Apply 1 application topically 2 (two) times daily as needed (for itching).    Calcium Carbonate (CALCIUM 600 PO) Take 600 mg by mouth daily.    digoxin (LANOXIN) 0.25  MG tablet Take 250 mcg by mouth daily.    diltiazem (CARDIZEM) 60 MG tablet Take 1 tablet (60 mg total) by mouth 3 (three) times daily. Qty: 90 tablet, Refills: 3    furosemide (LASIX) 40 MG tablet Take 80 mg in the morning and 40 mg in the evening Qty: 90 tablet, Refills: 3    glipiZIDE (GLUCOTROL) 5 MG tablet Take 1 tablet (5 mg total) by mouth 2 (two) times daily before a meal. HOLD IF BLOOD SUGAR IS LESS THAN 115.    lisinopril (PRINIVIL,ZESTRIL) 5 MG tablet Take 5 mg by mouth daily.    metFORMIN (GLUCOPHAGE-XR) 500 MG 24 hr tablet Take 1 tablet (500 mg total) by mouth daily. DO NOT START UNTIL 12/28/2015.    metoprolol tartrate (LOPRESSOR) 25 MG tablet Take 25 mg by mouth  2 (two) times daily.    triamcinolone cream (KENALOG) 0.1 % Apply 1 application topically 2 (two) times daily.    Vitamin D, Cholecalciferol, 400 UNITS TABS Take 400 Units by mouth daily.    !! warfarin (COUMADIN) 2 MG tablet Take 1-2 tablets by mouth See admin instructions. Take 1 tablet by mouth on Tuesday, Thursday and Saturday.  Take (1/2) tablet by mouth on Sunday, Monday, Wednesday, and Friday.    !! warfarin (COUMADIN) 6 MG tablet Take 6 mg by mouth every evening.     !! - Potential duplicate medications found. Please discuss with provider.     No Known Allergies Follow-up Information    Follow up with Dr. Corliss Skains. Go on 02/04/2016.   Why:  arrive at 12:00 pm at [facility], main entrance, ask for radiology, call [phone] with questions   Contact information:   [address] [place], [place] [address]      Follow up with Fuller Canada, MD On 02/05/2016.   Specialties:  Orthopedic Surgery, Radiology   Why:  xrays of left ankle before appointment at 2:30 pm   Contact information:   [address] [place] [address] [phone]        The results of significant diagnostics from this hospitalization (including imaging, microbiology, ancillary and laboratory) are listed below for reference.    Significant Diagnostic Studies: Dg Ankle Complete Left  2016-02-12  CLINICAL DATA:  70 year old female with left ankle pain and swelling and redness. EXAM: LEFT ANKLE COMPLETE - 3+ VIEW COMPARISON:  Radiograph dated 07/16/2008 FINDINGS: There is a minimally displaced fracture of the medial malleolus. No other acute fracture identified. The bones are osteopenic which limits evaluation for fracture. There is mild narrowing of the medial aspect of the ankle mortise. There is diffuse subcutaneous edema with diffuse speckled skin calcification, likely related to chronic venous insufficiency. No radiopaque foreign object identified. IMPRESSION: Minimally displaced  fracture of the medial malleolus. CT may provide better evaluation. Electronically Signed   By: Elgie Collard M.D.   On: 02/12/2016 01:37   Ct Head Wo Contrast  01/25/2016  CLINICAL DATA:  Slurred speech. EXAM: CT HEAD WITHOUT CONTRAST TECHNIQUE: Contiguous axial images were obtained from the base of the skull through the vertex without intravenous contrast. COMPARISON:  None FINDINGS: There is patchy low attenuation within the subcortical white matter bilaterally compatible with chronic microvascular disease. There is no abnormal extra-axial fluid collection, intracranial hemorrhage or mass identified. No evidence for acute brain infarct. There is mild scratch set bilateral maxillary sinus disease identified. There is opacification of the left mastoid air cells. The right mastoid air cells appear underdeveloped. The calvarium  is intact. IMPRESSION: 1. Small vessel ischemic disease. 2. No acute intracranial abnormalities noted. 3. Left mastoid air cell opacification. Mild bilateral maxillary sinus disease also noted. Electronically Signed   By: Signa Kell M.D.   On: 01/25/2016 23:15   Mr Shirlee Latch Wo Contrast  01/27/2016  CLINICAL DATA:  70 year old female with altered mental status, slurred speech, and weakness for 2 days. Initial encounter. EXAM: MRA HEAD WITHOUT CONTRAST TECHNIQUE: Angiographic images of the Circle of Willis were obtained using MRA technique without intravenous contrast. COMPARISON:  Brain MRI from today reported separately. Head CT without contrast 01/25/2016. FINDINGS: Antegrade flow in the posterior circulation with dominant appearing distal right vertebral artery. No distal vertebral artery stenosis. Patent vertebrobasilar junction. Mildly tortuous basilar artery without stenosis. Normal SCA and PCA origins. Normal right posterior communicating artery, the left is diminutive or absent. Bilateral PCA branches are within normal limits. Antegrade flow in both ICA siphons. ICA  tortuosity without stenosis. The right ophthalmic and posterior communicating artery origins are within normal limits. Just distal to the left ophthalmic artery origin arising in the supraclinoid ICA and directed superiorly there is a 3 mm saccular aneurysm (series 1, image 72 and series 109, image 35). Otherwise normal distal ICAs. Patent carotid termini. Mildly dominant right ACA A1 segment. MCA and ACA origins within normal limits. Anterior communicating artery and proximal ACA branches within normal limits. Mild motion artifact affecting the distal ACA branches. MCA M1 segments and bifurcations are within normal limits. Motion artifact affecting the visualized bilateral MCA branches. IMPRESSION: 1. 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 Recommend Neuro-endovascular follow-up. 2. No intracranial stenosis stenosis. Mild intracranial tortuosity and otherwise negative for age intracranial MRA. Electronically Signed   By: Odessa Fleming M.D.   On: 01/27/2016 08:52   Mr Brain Wo Contrast  01/27/2016  CLINICAL DATA:  70 year old female with altered mental status, slurred speech, and weakness for 2 days. Initial encounter. EXAM: MRI HEAD WITHOUT CONTRAST TECHNIQUE: Multiplanar, multiecho pulse sequences of the brain and surrounding structures were obtained without intravenous contrast. COMPARISON:  Head CT without contrast 01/25/2016 FINDINGS: Study is intermittently degraded by motion artifact despite repeated imaging attempts. No restricted diffusion or evidence of acute infarction. Major intracranial vascular flow voids are preserved. Cerebral volume is within normal limits for age. No midline shift. No ventriculomegaly. No acute intracranial hemorrhage identified. Negative pituitary and cervicomedullary junction. Evidence of a small right lateral convexity extra-axial mass encompassing 16 x 8 x 15 mm (AP by transverse by CC). This is most parent on diffusion-weighted imaging but also is visible on axial FLAIR and T1  imaging being nearly isointense to gray matter. The lesion appears mineralized on T2 * imaging (series 9, image 16) and was mildly hyperdense on the comparison CT. No other intracranial mass lesion identified. Bilateral scattered, patchy, and confluent nonspecific cerebral white matter T2 and FLAIR hyperintensity. No cortical encephalomalacia. Deep gray matter nuclei, brainstem, and cerebellum are within normal limits. Left mastoid effusion. Nasopharynx within normal limits. The left tympanic cavity min may also be partially opacified. Mild right maxillary sinus mucosal thickening. Grossly negative orbit and scalp soft tissues. 3.4 cm left parietal bone lesion with T2 hyperintensity (series 6, image 16, FLAIR hyperintensity, and mild intrinsic increased T1 signal (series 6, image 64). This has conspicuous increased diffusion signal. This appeared non expansile and circumscribed on the recent CT. Elsewhere bone marrow signal appears within normal limits. Partially visible degenerative cervical spine changes with mild to moderate degenerative spinal stenosis  suspected at C3-C4. IMPRESSION: 1.  No acute intracranial abnormality. 2. Right lateral convexity 1.6 cm meningioma suspected, unlikely clinically silent. 3. Moderate for age nonspecific white matter signal changes, most commonly due to chronic small vessel disease. 4. Left mastoid effusion with partial opacification of the left tympanic cavity, consider inflammatory or cholesteatoma etiology. 5. Degenerative cervical spinal stenosis at C3-C4. 6. Benign-appearing left parietal bone lesion, favor hemangioma. Electronically Signed   By: Odessa Fleming M.D.   On: 01/27/2016 08:47   US Carotid Bilateral  01/26/2016  CLINICAL DATA:  TIA.  Down syndrome. EXAM: BILATERAL CAROTID DUPLEX ULTRASOUND TECHNIQUE: Wallace Cullens scale imaging, color Doppler and duplex ultrasound was performed of bilateral carotid and vertebral arteries in the neck. COMPARISON:  None available REVIEW OF  SYSTEMS: Quantification of carotid stenosis is based on velocity parameters that correlate the residual internal carotid diameter with NASCET-based stenosis levels, using the diameter of the distal internal carotid lumen as the denominator for stenosis measurement. The following velocity measurements were obtained: PEAK SYSTOLIC/END DIASTOLIC RIGHT ICA:                     78/24cm/sec CCA:                     76/11cm/sec SYSTOLIC ICA/CCA RATIO:  1.1 DIASTOLIC ICA/CCA RATIO: 2.2 ECA:                     116cm/sec LEFT ICA:                     118/26cm/sec CCA:                     86/13cm/sec SYSTOLIC ICA/CCA RATIO:  1.4 DIASTOLIC ICA/CCA RATIO: 2.0 ECA:                     135cm/sec FINDINGS: RIGHT CAROTID ARTERY: Mild plaque at the bifurcation. No high-grade stenosis. Normal waveforms and color Doppler signal. RIGHT VERTEBRAL ARTERY: Normal flow direction and waveform. A nonspecific cardiac arrhythmia is noted. LEFT CAROTID ARTERY: Mild smooth noncalcified plaque in the carotid bulb extending into proximal internal and external carotid arteries. No high-grade stenosis. Normal waveforms and color Doppler signal. LEFT VERTEBRAL ARTERY: Normal flow direction and waveform. IMPRESSION: 1. Mild bilateral carotid bifurcation plaque resulting in less than 50% diameter stenosis. The exam does not exclude plaque ulceration or embolization. Continued surveillance recommended. 2. Nonspecific cardiac arrhythmia noted. Electronically Signed   By: Corlis Leak M.D.   On: 01/26/2016 10:18   Dg Tibia/fibula Left Port  01/27/2016  CLINICAL DATA:  Ankle fracture EXAM: PORTABLE LEFT TIBIA AND FIBULA - 2 VIEW COMPARISON:  None. FINDINGS: Mild diffuse osteopenia. Age in the determinate deformity involving the ankle is identified. Due to patient positioning exact details of the deformity is uncertain. Recommend further investigation with CT of the ankle. Chronic lateral tibial plateau fracture noted. IMPRESSION: 1. Age-indeterminate  deformity involving the right ankle. Recommend further investigation with CT of the ankle. 2. Chronic lateral tibial plateau fracture. Electronically Signed   By: Signa Kell M.D.   On: 01/27/2016 09:31   Dg Abd Acute W/chest  01/25/2016  CLINICAL DATA:  Nausea and vomiting beginning today. Atrial fibrillation. Systolic dysfunction. Hypertension. EXAM: DG ABDOMEN ACUTE W/ 1V CHEST COMPARISON:  12/25/2015 FINDINGS: There is no evidence of dilated bowel loops or free intraperitoneal air. No radiopaque calculi or other significant radiographic abnormality is seen. Moderate cardiomegaly  stable. Small right pleural effusion and bibasilar atelectasis show no significant change. IMPRESSION: Unremarkable bowel gas pattern. Stable cardiomegaly, small right pleural effusion, and bibasilar atelectasis. Electronically Signed   By: Myles RosenthalJohn  Stahl M.D.   On: 01/25/2016 21:00   Mm Digital Screening Bilateral  01/17/2016  CLINICAL DATA:  Screening. EXAM: DIGITAL SCREENING BILATERAL MAMMOGRAM WITH CAD COMPARISON:  Previous exam(s). ACR Breast Density Category b: There are scattered areas of fibroglandular density. FINDINGS: In the right breast, calcifications warrant further evaluation with magnified views. In the left breast, no findings suspicious for malignancy. Images were processed with CAD. IMPRESSION: Further evaluation is suggested for calcifications in the right breast. RECOMMENDATION: Diagnostic mammogram of the right breast. (Code:[id number]-[id number]) The patient will be contacted regarding the findings, and additional imaging will be scheduled. BI-RADS CATEGORY  0: Incomplete. Need additional imaging evaluation and/or prior mammograms for comparison. Electronically Signed   By: Sherian ReinWei-Chen  Lin M.D.   On: 01/17/2016 13:11    Microbiology: Recent Results (from the past 240 hour(s))  MRSA PCR Screening     Status: None   Collection Time: 01/26/16  2:22 AM  Result Value Ref Range Status   MRSA by PCR NEGATIVE NEGATIVE Final     Comment:        The GeneXpert MRSA Assay (FDA approved for NASAL specimens only), is one component of a comprehensive MRSA colonization surveillance program. It is not intended to diagnose MRSA infection nor to guide or monitor treatment for MRSA infections.      Labs: Basic Metabolic Panel:  Recent Labs Lab 01/24/16 0801 01/25/16 1905 01/25/16 2217 01/27/16 0710 01/29/16 0618  NA 134* 132*  --  135 135  K 3.5 3.4*  --  3.8 3.8  CL 95* 95*  --  99* 98*  CO2 23 25  --  28 28  GLUCOSE 117* 205*  --  129* 146*  BUN 13 16  --  17 13  CREATININE 0.48* 0.53  --  0.47 0.39*  CALCIUM 9.1 8.5*  --  8.4* 8.8*  MG  --   --  1.7  --   --    Liver Function Tests:  Recent Labs Lab 01/25/16 1905  AST 24  ALT 16  ALKPHOS 116  BILITOT 1.3*  PROT 7.7  ALBUMIN 3.9    Recent Labs Lab 01/25/16 1905  LIPASE 29     Recent Labs Lab 01/25/16 1905 01/27/16 0710 01/29/16 0618  WBC 12.8* 9.3 9.4  NEUTROABS 10.9*  --   --   HGB 13.2 12.6 13.4  HCT 39.4 38.5 41.1  MCV 86.0 87.3 87.6  PLT 164 179 209   Cardiac Enzymes:  Recent Labs Lab 01/25/16 1905  TROPONINI <0.03   BNP: BNP (last 3 results)  Recent Labs  12/07/15 0752 12/24/15 1700 01/25/16 2220  BNP 760.0* 563.0* 432.0*    CBG:  Recent Labs Lab 01/29/16 0801 01/29/16 1122 01/29/16 1633 01/29/16 2028 01/30/16 0750  GLUCAP 141* 130* 121* 194* 136*     By signing my name below, I, Adron BeneGreylon Gawaluck, attest that this documentation has been prepared under the direction and in the presence of Erick BlinksJehanzeb Develle Sievers, MD. Electronically Signed: Adron BeneGreylon Gawaluck  I, Dr. Erick BlinksJehanzeb Noraa Pickeral, personally performed the services described in this documentaiton. All medical record entries made by the scribe were at my direction and in my presence. I have reviewed the chart and agree that the record reflects my personal performance and is accurate and complete  Erick BlinksJehanzeb Quinton Voth, MD, 01/30/2016 1:26  PM

## 2016-01-30 NOTE — Consult Note (Signed)
   [facility] CM Inpatient Consult   01/30/2016  Renetta ChalkSybil J Cuadras 1946/06/24 [id number]  [facility]. However patient will discharge to SNF and plan to remain LTC. Not appropriate for [facility] program services at this time.  For questions, please contact: Alben SpittleMary E. Albertha GheeNiemczura, RN, BSN, [facility]  [facility] Hospital Liaison [phone]

## 2016-01-31 DIAGNOSIS — R262 Difficulty in walking, not elsewhere classified: Secondary | ICD-10-CM | POA: Diagnosis not present

## 2016-01-31 DIAGNOSIS — G459 Transient cerebral ischemic attack, unspecified: Secondary | ICD-10-CM | POA: Diagnosis not present

## 2016-02-03 DIAGNOSIS — G459 Transient cerebral ischemic attack, unspecified: Secondary | ICD-10-CM | POA: Diagnosis not present

## 2016-02-03 DIAGNOSIS — R262 Difficulty in walking, not elsewhere classified: Secondary | ICD-10-CM | POA: Diagnosis not present

## 2016-02-04 ENCOUNTER — Other Ambulatory Visit (HOSPITAL_COMMUNITY): Payer: Self-pay | Admitting: Interventional Radiology

## 2016-02-04 ENCOUNTER — Ambulatory Visit (HOSPITAL_COMMUNITY): Admit: 2016-02-04 | Payer: Medicare Other

## 2016-02-04 ENCOUNTER — Ambulatory Visit (HOSPITAL_COMMUNITY): Payer: Medicare Other

## 2016-02-04 DIAGNOSIS — Z79899 Other long term (current) drug therapy: Secondary | ICD-10-CM | POA: Diagnosis not present

## 2016-02-04 DIAGNOSIS — G459 Transient cerebral ischemic attack, unspecified: Secondary | ICD-10-CM | POA: Diagnosis not present

## 2016-02-04 DIAGNOSIS — I10 Essential (primary) hypertension: Secondary | ICD-10-CM | POA: Diagnosis not present

## 2016-02-04 DIAGNOSIS — R531 Weakness: Secondary | ICD-10-CM | POA: Diagnosis not present

## 2016-02-04 DIAGNOSIS — I480 Paroxysmal atrial fibrillation: Secondary | ICD-10-CM | POA: Diagnosis not present

## 2016-02-04 DIAGNOSIS — I671 Cerebral aneurysm, nonruptured: Secondary | ICD-10-CM

## 2016-02-04 DIAGNOSIS — R262 Difficulty in walking, not elsewhere classified: Secondary | ICD-10-CM | POA: Diagnosis not present

## 2016-02-04 DIAGNOSIS — E119 Type 2 diabetes mellitus without complications: Secondary | ICD-10-CM | POA: Diagnosis not present

## 2016-02-04 DIAGNOSIS — D649 Anemia, unspecified: Secondary | ICD-10-CM | POA: Diagnosis not present

## 2016-02-05 ENCOUNTER — Ambulatory Visit: Payer: Medicare Other | Admitting: Orthopedic Surgery

## 2016-02-05 ENCOUNTER — Ambulatory Visit (INDEPENDENT_AMBULATORY_CARE_PROVIDER_SITE_OTHER): Payer: Medicare Other

## 2016-02-05 ENCOUNTER — Encounter: Payer: Self-pay | Admitting: Orthopedic Surgery

## 2016-02-05 VITALS — BP 123/64 | Ht 64.0 in | Wt 143.0 lb

## 2016-02-05 DIAGNOSIS — S82892A Other fracture of left lower leg, initial encounter for closed fracture: Secondary | ICD-10-CM | POA: Diagnosis not present

## 2016-02-05 DIAGNOSIS — G459 Transient cerebral ischemic attack, unspecified: Secondary | ICD-10-CM | POA: Diagnosis not present

## 2016-02-05 DIAGNOSIS — R262 Difficulty in walking, not elsewhere classified: Secondary | ICD-10-CM | POA: Diagnosis not present

## 2016-02-05 DIAGNOSIS — S8252XD Displaced fracture of medial malleolus of left tibia, subsequent encounter for closed fracture with routine healing: Secondary | ICD-10-CM

## 2016-02-05 NOTE — Progress Notes (Signed)
Patient ID: Angel ChalkSybil J Empson, female   DOB: 1946-02-27, [age] y.o.   MRN: [id number]  Chief Complaint  Patient presents with  . Ankle Injury    er follow up Left ankle fx, DOI 01/26/16    HPI medial malleolus fracture approximately 10-11 days ago treated with a Cam Walker follow-up x-rays today  ROS  BP 123/64 mmHg  Ht 5\' 4"  (1.626 m)  Wt 143 lb (64.864 kg)  BMI 24.53 kg/m2  Today's x-rays show minimal displacement of the medial malleolar fracture.    ASSESSMENT AND PLAN   The patient will continue in the Cam Walker weightbearing as tolerated  Follow-up x-rays in 4 weeks.

## 2016-02-05 NOTE — Patient Instructions (Signed)
Continue brace for walking. Weight-bear as tolerated in the brace. Check skin twice daily.  Return in 4 weeks for x-ray

## 2016-02-06 ENCOUNTER — Encounter: Payer: Self-pay | Admitting: Cardiology

## 2016-02-06 DIAGNOSIS — G459 Transient cerebral ischemic attack, unspecified: Secondary | ICD-10-CM | POA: Diagnosis not present

## 2016-02-06 DIAGNOSIS — R262 Difficulty in walking, not elsewhere classified: Secondary | ICD-10-CM | POA: Diagnosis not present

## 2016-02-06 NOTE — Progress Notes (Signed)
Patient cancelled   This encounter was created in error - please disregard. 

## 2016-02-07 DIAGNOSIS — G459 Transient cerebral ischemic attack, unspecified: Secondary | ICD-10-CM | POA: Diagnosis not present

## 2016-02-07 DIAGNOSIS — R262 Difficulty in walking, not elsewhere classified: Secondary | ICD-10-CM | POA: Diagnosis not present

## 2016-02-08 DIAGNOSIS — G459 Transient cerebral ischemic attack, unspecified: Secondary | ICD-10-CM | POA: Diagnosis not present

## 2016-02-08 DIAGNOSIS — R262 Difficulty in walking, not elsewhere classified: Secondary | ICD-10-CM | POA: Diagnosis not present

## 2016-02-09 DIAGNOSIS — G459 Transient cerebral ischemic attack, unspecified: Secondary | ICD-10-CM | POA: Diagnosis not present

## 2016-02-09 DIAGNOSIS — R262 Difficulty in walking, not elsewhere classified: Secondary | ICD-10-CM | POA: Diagnosis not present

## 2016-02-10 ENCOUNTER — Ambulatory Visit (HOSPITAL_COMMUNITY): Admission: RE | Admit: 2016-02-10 | Payer: Medicare Other | Source: Ambulatory Visit

## 2016-02-10 DIAGNOSIS — G459 Transient cerebral ischemic attack, unspecified: Secondary | ICD-10-CM | POA: Diagnosis not present

## 2016-02-10 DIAGNOSIS — R262 Difficulty in walking, not elsewhere classified: Secondary | ICD-10-CM | POA: Diagnosis not present

## 2016-02-11 DIAGNOSIS — G459 Transient cerebral ischemic attack, unspecified: Secondary | ICD-10-CM | POA: Diagnosis not present

## 2016-02-11 DIAGNOSIS — R262 Difficulty in walking, not elsewhere classified: Secondary | ICD-10-CM | POA: Diagnosis not present

## 2016-02-12 ENCOUNTER — Ambulatory Visit (INDEPENDENT_AMBULATORY_CARE_PROVIDER_SITE_OTHER): Payer: Medicare Other | Admitting: Physician Assistant

## 2016-02-12 ENCOUNTER — Encounter: Payer: Self-pay | Admitting: Physician Assistant

## 2016-02-12 VITALS — BP 116/64 | HR 60 | Ht 63.0 in | Wt 141.0 lb

## 2016-02-12 DIAGNOSIS — I482 Chronic atrial fibrillation, unspecified: Secondary | ICD-10-CM

## 2016-02-12 DIAGNOSIS — G459 Transient cerebral ischemic attack, unspecified: Secondary | ICD-10-CM | POA: Diagnosis not present

## 2016-02-12 DIAGNOSIS — I5042 Chronic combined systolic (congestive) and diastolic (congestive) heart failure: Secondary | ICD-10-CM | POA: Diagnosis not present

## 2016-02-12 DIAGNOSIS — R262 Difficulty in walking, not elsewhere classified: Secondary | ICD-10-CM | POA: Diagnosis not present

## 2016-02-12 NOTE — Progress Notes (Signed)
Cardiology Office Note   Date:  02/12/2016   ID:  Cyerra, Yim August 01, 1946, MRN [id number]  PCP:  [facility]  Cardiologist:  Dr. Diona Browner Chief Complaint: 5 pound weight gain    History of Present Illness: Angel Torres is a 70 y.o. female who presents for for two-week follow-up. She has a history of Down syndrome and resides at [facility] [place] nursing center. She had 2 recent hospitalizations for recurrent right pleural effusion status post thoracentesis. 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 2-D echo showed only mild reduction in LVEF 48% and an undetermined diastolic function. She does have RV dysfunction and moderate pulmonary hypertension. She also has chronic atrial fibrillation on Coumadin, Cardizem and Lopressor followed at [facility]. She also has hypertension, history of pulmonary embolus, details unclear.  Patient saw Dr. Diona Browner on 01/01/16 at which time she had weight gain increased abdominal girth and leg edema. He increased her Lasix to 80 mg in the morning 40 in the afternoon with follow-up labs. Her weight was 138 in our office on 01/01/16 but 148 and the nursing home which was up 6 pounds in 3 days.  I saw her 01/15/16 with her weight up to 154 pounds. On their scales 147 pounds on our scales. She denies any shortness of breath or cardiac complaints. Her aide tells me she is on a low-sodium diet and not getting any extra salt. Labs drawn on 01/08/16 creatinine was stable at 0.62 potassium 4.1.  She then went to the emergency room with vertigo nausea and vomiting and was noted to have slurred speech. 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 She was referred to Dr. Corliss Skains. She was also found to have a left ankle fracture not felt to be a surgical candidate and was placed in a boot. Repeat 2-D echo 01/26/16 showed EF of  35-40% with diffuse hypokinesis grade 2 diastolic dysfunction, no significant change from echo in January 2017. EEG performed on 01/29/16 and was normal.  She comes in today accompanied by transport. She denies any complaints. Her weight is down to 141 pounds and she looks much better.    Past Medical History  Diagnosis Date  . Down syndrome   . Essential hypertension   . Systolic dysfunction     LVEF 40%, indeterminate diastolic function  . Chronic atrial fibrillation (HCC)   . Pneumonia   . Pulmonary embolism (HCC)   . Leg fracture   . Pulmonary hypertension (HCC)     Moderate  . Right ventricular dysfunction     Past Surgical History  Procedure Laterality Date  . Thoracentesis Right 12/09/2015     Current Outpatient Prescriptions  Medication Sig Dispense Refill  . alendronate (FOSAMAX) 70 MG tablet Take 70 mg by mouth once a week. Takes on Mondays. Take with a full glass of water on an empty stomach.    . Calcium Carbonate (CALCIUM 600 PO) Take 600 mg by mouth daily.    . digoxin (LANOXIN) 0.25 MG tablet Take 250 mcg by mouth daily.    Marland Kitchen diltiazem (CARDIZEM) 60 MG tablet Take 1 tablet (60 mg total) by mouth 3 (three) times daily. 90 tablet 3  . furosemide (LASIX) 40 MG tablet Take 80 mg in the morning and 40 mg in the evening (Patient taking differently: Take 40-80 mg by mouth 2 (  two) times daily. Take 80 mg in the morning and 40 mg in the evening) 90 tablet 3  . glipiZIDE (GLUCOTROL) 5 MG tablet Take 1 tablet (5 mg total) by mouth 2 (two) times daily before a meal. HOLD IF BLOOD SUGAR IS LESS THAN 115.    . lisinopril (PRINIVIL,ZESTRIL) 5 MG tablet Take 5 mg by mouth daily.    . meclizine (ANTIVERT) 12.5 MG tablet Take 1 tablet (12.5 mg total) by mouth 3 (three) times daily. 30 tablet 0  . metFORMIN (GLUCOPHAGE-XR) 500 MG 24 hr tablet Take 1 tablet (500 mg total) by mouth daily. DO NOT START UNTIL 12/28/2015.    . metoprolol tartrate (LOPRESSOR) 25 MG tablet Take 25 mg by mouth 2  (two) times daily.    Marland Kitchen oxyCODONE-acetaminophen (PERCOCET/ROXICET) 5-325 MG tablet Take 1-2 tablets by mouth every 4 (four) hours as needed for moderate pain or severe pain. 30 tablet 0  . triamcinolone cream (KENALOG) 0.1 % Apply 1 application topically 2 (two) times daily.    . Vitamin D, Cholecalciferol, 400 UNITS TABS Take 400 Units by mouth daily.    Marland Kitchen warfarin (COUMADIN) 2 MG tablet Take 1-2 tablets by mouth See admin instructions. Take 1 tablet by mouth on Tuesday, Thursday and Saturday.  Take (1/2) tablet by mouth on Sunday, Monday, Wednesday, and Friday.    . betamethasone dipropionate (DIPROLENE) 0.05 % cream Apply 1 application topically 2 (two) times daily as needed (for itching). Reported on 02/12/2016    . warfarin (COUMADIN) 6 MG tablet Take 6 mg by mouth every evening.     No current facility-administered medications for this visit.    Allergies:   Review of patient's allergies indicates no known allergies.    Social History:  The patient  reports that she has never smoked. She has never used smokeless tobacco. She reports that she does not drink alcohol or use illicit drugs.   Family History:  The patient's    family history includes Arrhythmia in her brother and father.    ROS:  Please see the history of present illness.   Otherwise, review of systems are positive for none.   All other systems are reviewed and negative.    PHYSICAL EXAM: VS:  BP 116/64 mmHg  Pulse 60  Ht  (1.6 m)  Wt 141 lb (63.957 kg)  BMI 24.98 kg/m2  SpO2 98% , BMI Body mass index is 24.98 kg/(m^2). GEN: Well nourished, well developed, in no acute distress Neck: no JVD, HJR, carotid bruits, or masses Cardiac: Irregular irregular 1/6 systolic murmur at the left sternal border, no gallop, rubs, thrill or heave,  Respiratory: Decreased breath sounds at right base otherwise  cGI: A little distended soft, nontender,  + BS MS: no deformity or atrophy Extremities: Trace of ankle edema bilaterally  boot on left foot without cyanosis, clubbing, good distal pulses bilaterally.  Skin: warm and dry, no rash Neuro:  Strength and sensation are intact    EKG:  EKG is not ordered today.    Recent Labs: 01/25/2016: ALT 16; B Natriuretic Peptide 432.0*; Magnesium 1.7 01/26/2016: TSH 0.503 01/29/2016: BUN 13; Creatinine, Ser 0.39*; Hemoglobin 13.4; Platelets 209; Potassium 3.8; Sodium 135    Lipid Panel    Component Value Date/Time   CHOL 80 01/26/2016 0705   TRIG 56 01/26/2016 0705   HDL 24* 01/26/2016 0705   CHOLHDL 3.3 01/26/2016 0705   VLDL 11 01/26/2016 0705   LDLCALC 45 01/26/2016 0705  Wt Readings from Last 3 Encounters:  02/12/16 141 lb (63.957 kg)  02/05/16 143 lb (64.864 kg)  01/26/16 143 lb 15.4 oz (65.3 kg)      Other studies Reviewed: Additional studies/ records that were reviewed today include and review of the records demonstrates:   Echocardiogram 12/08/2015: Study Conclusions  - Left ventricle: The cavity size was normal. There was mild   concentric hypertrophy. The estimated ejection fraction was 48%.   Diffuse hypokinesis. The study is not technically sufficient to   allow evaluation of LV diastolic function. Ejection fraction   (MOD, 2-plane): 48%. - Ventricular septum: Septal motion showed paradox. The contour   showed diastolic flattening. - Mitral valve: Mildly thickened leaflets . There was mild   regurgitation. - Left atrium: The atrium was at the upper limits of normal in   size. - Right ventricle: The cavity size was moderately dilated. Systolic   function is reduced. - Right atrium: The atrium was mildly dilated. - Tricuspid valve: There was moderate regurgitation. - Pulmonary arteries: PA peak pressure: 52 mm Hg (S). - Inferior vena cava: The vessel was dilated. The respirophasic   diameter changes were blunted (< 50%), consistent with elevated   central venous pressure.  Impressions:  - Compared to a prior echo in 2010, LVEF is  higher at 48%. There is   more significant right heart enlargement, RV hypokinesis,   moderate pulmoanry hypertension with signs of volume and pressure   overload, RVSP of 52 mmHg, moderate TR and dilated IVC.  2-D echo 01/26/16 Study Conclusions   - Left ventricle: The cavity size was normal. There was mild focal   basal hypertrophy of the septum. Systolic function was moderately   reduced. The estimated ejection fraction was in the range of 35%   to 40%. Diffuse hypokinesis. Features are consistent with a   pseudonormal left ventricular filling pattern, with concomitant   abnormal relaxation and increased filling pressure (grade 2   diastolic dysfunction). - Ventricular septum: The contour showed diastolic flattening. - Aortic valve: Trileaflet; mildly thickened, mildly calcified   leaflets. - Mitral valve: Calcified annulus. Moderately thickened leaflets . - Left atrium: The atrium was severely dilated. - Right ventricle: The cavity size was mildly dilated. Wall   thickness was normal. Systolic function was moderately reduced. - Right atrium: The atrium was mildly dilated. - Tricuspid valve: There was moderate regurgitation. - Pulmonary arteries: Systolic pressure was severely increased. PA   peak pressure: 70 mm Hg (S).   Impressions:   - No cardiac source of emboli was indentified. Compared to the   prior study, there has been no significant interval change.     ASSESSMENT AND PLAN:  Chronic combined systolic and diastolic heart failure (HCC) Patient has lost 13 pounds since I last saw her. I had increased her Lasix to 80 mg twice a day for 3 days and then back to her regular dose 80 in the morning 40 in the evening. She has diuresed nicely and is feeling much better. Continue current treatment. Labs in the emergency room were reviewed so will not repeat labs today. Follow-up with Dr. Diona BrownerMcDowell.  Chronic atrial fibrillation (HCC) Patient is maintained on Coumadin and managed  [facility]. She is also on metoprolol digoxin and diltiazem.  Brain TIA Patient was found to have a small 3 mm supraclinoid left ICA aneurysm and is to follow-up with Dr.Devashwar    Signed, Jacolyn ReedyMichele Kylena Mole, PA-C  02/12/2016 2:36 PM    [facility] [place], [place], [place]  [address] Phone: [phone]; Fax: [phone]

## 2016-02-12 NOTE — Assessment & Plan Note (Signed)
Patient is maintained on Coumadin and [facility]. She is also on metoprolol digoxin and diltiazem.

## 2016-02-12 NOTE — Assessment & Plan Note (Signed)
Patient has lost 13 pounds since I last saw her. I had increased her Lasix to 80 mg twice a day for 3 days and then back to her regular dose 80 in the morning 40 in the evening. She has diuresed nicely and is feeling much better. Continue current treatment. Labs in the emergency room were reviewed so will not repeat labs today. Follow-up with Dr. Diona BrownerMcDowell.

## 2016-02-12 NOTE — Patient Instructions (Signed)
Medication Instructions:  Your physician recommends that you continue on your current medications as directed. Please refer to the Current Medication list given to you today.   Labwork: NONE  Testing/Procedures: NONE  Follow-Up: Your physician recommends that you schedule a follow-up appointment in: 1 MONTH   Any Other Special Instructions Will Be Listed Below (If Applicable).     If you need a refill on your cardiac medications before your next appointment, please call your pharmacy.   

## 2016-02-12 NOTE — Assessment & Plan Note (Signed)
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

## 2016-02-14 DIAGNOSIS — L97819 Non-pressure chronic ulcer of other part of right lower leg with unspecified severity: Secondary | ICD-10-CM | POA: Diagnosis not present

## 2016-02-14 DIAGNOSIS — I671 Cerebral aneurysm, nonruptured: Secondary | ICD-10-CM | POA: Diagnosis not present

## 2016-02-14 DIAGNOSIS — L738 Other specified follicular disorders: Secondary | ICD-10-CM | POA: Diagnosis not present

## 2016-02-14 DIAGNOSIS — H00012 Hordeolum externum right lower eyelid: Secondary | ICD-10-CM | POA: Diagnosis not present

## 2016-02-14 DIAGNOSIS — R4781 Slurred speech: Secondary | ICD-10-CM | POA: Diagnosis not present

## 2016-02-14 DIAGNOSIS — I5042 Chronic combined systolic (congestive) and diastolic (congestive) heart failure: Secondary | ICD-10-CM | POA: Diagnosis not present

## 2016-02-14 DIAGNOSIS — I10 Essential (primary) hypertension: Secondary | ICD-10-CM | POA: Diagnosis not present

## 2016-02-14 DIAGNOSIS — I272 Other secondary pulmonary hypertension: Secondary | ICD-10-CM | POA: Diagnosis not present

## 2016-02-14 DIAGNOSIS — M25572 Pain in left ankle and joints of left foot: Secondary | ICD-10-CM | POA: Diagnosis not present

## 2016-02-14 DIAGNOSIS — R279 Unspecified lack of coordination: Secondary | ICD-10-CM | POA: Diagnosis not present

## 2016-02-14 DIAGNOSIS — L603 Nail dystrophy: Secondary | ICD-10-CM | POA: Diagnosis not present

## 2016-02-14 DIAGNOSIS — E119 Type 2 diabetes mellitus without complications: Secondary | ICD-10-CM | POA: Diagnosis not present

## 2016-02-14 DIAGNOSIS — Q909 Down syndrome, unspecified: Secondary | ICD-10-CM | POA: Diagnosis not present

## 2016-02-14 DIAGNOSIS — M6281 Muscle weakness (generalized): Secondary | ICD-10-CM | POA: Diagnosis not present

## 2016-02-14 DIAGNOSIS — G459 Transient cerebral ischemic attack, unspecified: Secondary | ICD-10-CM | POA: Diagnosis not present

## 2016-02-14 DIAGNOSIS — I482 Chronic atrial fibrillation: Secondary | ICD-10-CM | POA: Diagnosis not present

## 2016-02-14 DIAGNOSIS — S8255XA Nondisplaced fracture of medial malleolus of left tibia, initial encounter for closed fracture: Secondary | ICD-10-CM | POA: Diagnosis not present

## 2016-02-14 DIAGNOSIS — S8255XD Nondisplaced fracture of medial malleolus of left tibia, subsequent encounter for closed fracture with routine healing: Secondary | ICD-10-CM | POA: Diagnosis not present

## 2016-02-14 DIAGNOSIS — B351 Tinea unguium: Secondary | ICD-10-CM | POA: Diagnosis not present

## 2016-02-14 DIAGNOSIS — M810 Age-related osteoporosis without current pathological fracture: Secondary | ICD-10-CM | POA: Diagnosis not present

## 2016-02-14 DIAGNOSIS — R4182 Altered mental status, unspecified: Secondary | ICD-10-CM | POA: Diagnosis not present

## 2016-02-14 DIAGNOSIS — I739 Peripheral vascular disease, unspecified: Secondary | ICD-10-CM | POA: Diagnosis not present

## 2016-02-14 DIAGNOSIS — Z7901 Long term (current) use of anticoagulants: Secondary | ICD-10-CM | POA: Diagnosis not present

## 2016-02-14 DIAGNOSIS — Z5181 Encounter for therapeutic drug level monitoring: Secondary | ICD-10-CM | POA: Diagnosis not present

## 2016-02-14 DIAGNOSIS — Q845 Enlarged and hypertrophic nails: Secondary | ICD-10-CM | POA: Diagnosis not present

## 2016-02-14 DIAGNOSIS — R42 Dizziness and giddiness: Secondary | ICD-10-CM | POA: Diagnosis not present

## 2016-02-14 DIAGNOSIS — E559 Vitamin D deficiency, unspecified: Secondary | ICD-10-CM | POA: Diagnosis not present

## 2016-02-15 DIAGNOSIS — Z5181 Encounter for therapeutic drug level monitoring: Secondary | ICD-10-CM | POA: Diagnosis not present

## 2016-02-15 DIAGNOSIS — Z7901 Long term (current) use of anticoagulants: Secondary | ICD-10-CM | POA: Diagnosis not present

## 2016-02-17 DIAGNOSIS — I5042 Chronic combined systolic (congestive) and diastolic (congestive) heart failure: Secondary | ICD-10-CM | POA: Diagnosis not present

## 2016-02-17 DIAGNOSIS — M810 Age-related osteoporosis without current pathological fracture: Secondary | ICD-10-CM | POA: Diagnosis not present

## 2016-02-17 DIAGNOSIS — Q909 Down syndrome, unspecified: Secondary | ICD-10-CM | POA: Diagnosis not present

## 2016-02-17 DIAGNOSIS — I482 Chronic atrial fibrillation: Secondary | ICD-10-CM | POA: Diagnosis not present

## 2016-02-17 DIAGNOSIS — M6281 Muscle weakness (generalized): Secondary | ICD-10-CM | POA: Diagnosis not present

## 2016-02-17 DIAGNOSIS — S8255XA Nondisplaced fracture of medial malleolus of left tibia, initial encounter for closed fracture: Secondary | ICD-10-CM | POA: Diagnosis not present

## 2016-02-17 DIAGNOSIS — I272 Other secondary pulmonary hypertension: Secondary | ICD-10-CM | POA: Diagnosis not present

## 2016-02-17 DIAGNOSIS — R42 Dizziness and giddiness: Secondary | ICD-10-CM | POA: Diagnosis not present

## 2016-02-17 DIAGNOSIS — E559 Vitamin D deficiency, unspecified: Secondary | ICD-10-CM | POA: Diagnosis not present

## 2016-02-17 DIAGNOSIS — I671 Cerebral aneurysm, nonruptured: Secondary | ICD-10-CM | POA: Diagnosis not present

## 2016-02-17 DIAGNOSIS — E119 Type 2 diabetes mellitus without complications: Secondary | ICD-10-CM | POA: Diagnosis not present

## 2016-02-17 DIAGNOSIS — G459 Transient cerebral ischemic attack, unspecified: Secondary | ICD-10-CM | POA: Diagnosis not present

## 2016-02-18 DIAGNOSIS — R42 Dizziness and giddiness: Secondary | ICD-10-CM | POA: Diagnosis not present

## 2016-02-18 DIAGNOSIS — I671 Cerebral aneurysm, nonruptured: Secondary | ICD-10-CM | POA: Diagnosis not present

## 2016-02-18 DIAGNOSIS — I5042 Chronic combined systolic (congestive) and diastolic (congestive) heart failure: Secondary | ICD-10-CM | POA: Diagnosis not present

## 2016-02-18 DIAGNOSIS — I482 Chronic atrial fibrillation: Secondary | ICD-10-CM | POA: Diagnosis not present

## 2016-02-18 DIAGNOSIS — S8255XA Nondisplaced fracture of medial malleolus of left tibia, initial encounter for closed fracture: Secondary | ICD-10-CM | POA: Diagnosis not present

## 2016-02-18 DIAGNOSIS — M810 Age-related osteoporosis without current pathological fracture: Secondary | ICD-10-CM | POA: Diagnosis not present

## 2016-02-18 DIAGNOSIS — G459 Transient cerebral ischemic attack, unspecified: Secondary | ICD-10-CM | POA: Diagnosis not present

## 2016-02-18 DIAGNOSIS — E119 Type 2 diabetes mellitus without complications: Secondary | ICD-10-CM | POA: Diagnosis not present

## 2016-02-18 DIAGNOSIS — E559 Vitamin D deficiency, unspecified: Secondary | ICD-10-CM | POA: Diagnosis not present

## 2016-02-21 DIAGNOSIS — I5042 Chronic combined systolic (congestive) and diastolic (congestive) heart failure: Secondary | ICD-10-CM | POA: Diagnosis not present

## 2016-02-21 DIAGNOSIS — M6281 Muscle weakness (generalized): Secondary | ICD-10-CM | POA: Diagnosis not present

## 2016-02-21 DIAGNOSIS — E559 Vitamin D deficiency, unspecified: Secondary | ICD-10-CM | POA: Diagnosis not present

## 2016-02-21 DIAGNOSIS — I272 Other secondary pulmonary hypertension: Secondary | ICD-10-CM | POA: Diagnosis not present

## 2016-02-21 DIAGNOSIS — I671 Cerebral aneurysm, nonruptured: Secondary | ICD-10-CM | POA: Diagnosis not present

## 2016-02-21 DIAGNOSIS — S8255XA Nondisplaced fracture of medial malleolus of left tibia, initial encounter for closed fracture: Secondary | ICD-10-CM | POA: Diagnosis not present

## 2016-02-21 DIAGNOSIS — M810 Age-related osteoporosis without current pathological fracture: Secondary | ICD-10-CM | POA: Diagnosis not present

## 2016-02-21 DIAGNOSIS — E119 Type 2 diabetes mellitus without complications: Secondary | ICD-10-CM | POA: Diagnosis not present

## 2016-02-21 DIAGNOSIS — R42 Dizziness and giddiness: Secondary | ICD-10-CM | POA: Diagnosis not present

## 2016-02-21 DIAGNOSIS — I482 Chronic atrial fibrillation: Secondary | ICD-10-CM | POA: Diagnosis not present

## 2016-02-21 DIAGNOSIS — G459 Transient cerebral ischemic attack, unspecified: Secondary | ICD-10-CM | POA: Diagnosis not present

## 2016-02-21 DIAGNOSIS — Q909 Down syndrome, unspecified: Secondary | ICD-10-CM | POA: Diagnosis not present

## 2016-02-24 DIAGNOSIS — M6281 Muscle weakness (generalized): Secondary | ICD-10-CM | POA: Diagnosis not present

## 2016-02-25 DIAGNOSIS — I272 Other secondary pulmonary hypertension: Secondary | ICD-10-CM | POA: Diagnosis not present

## 2016-02-25 DIAGNOSIS — E119 Type 2 diabetes mellitus without complications: Secondary | ICD-10-CM | POA: Diagnosis not present

## 2016-02-25 DIAGNOSIS — M6281 Muscle weakness (generalized): Secondary | ICD-10-CM | POA: Diagnosis not present

## 2016-02-25 DIAGNOSIS — M810 Age-related osteoporosis without current pathological fracture: Secondary | ICD-10-CM | POA: Diagnosis not present

## 2016-02-25 DIAGNOSIS — G459 Transient cerebral ischemic attack, unspecified: Secondary | ICD-10-CM | POA: Diagnosis not present

## 2016-02-25 DIAGNOSIS — I671 Cerebral aneurysm, nonruptured: Secondary | ICD-10-CM | POA: Diagnosis not present

## 2016-02-25 DIAGNOSIS — Q909 Down syndrome, unspecified: Secondary | ICD-10-CM | POA: Diagnosis not present

## 2016-02-25 DIAGNOSIS — I482 Chronic atrial fibrillation: Secondary | ICD-10-CM | POA: Diagnosis not present

## 2016-02-25 DIAGNOSIS — E559 Vitamin D deficiency, unspecified: Secondary | ICD-10-CM | POA: Diagnosis not present

## 2016-02-25 DIAGNOSIS — S8255XA Nondisplaced fracture of medial malleolus of left tibia, initial encounter for closed fracture: Secondary | ICD-10-CM | POA: Diagnosis not present

## 2016-02-25 DIAGNOSIS — R42 Dizziness and giddiness: Secondary | ICD-10-CM | POA: Diagnosis not present

## 2016-02-25 DIAGNOSIS — I5042 Chronic combined systolic (congestive) and diastolic (congestive) heart failure: Secondary | ICD-10-CM | POA: Diagnosis not present

## 2016-02-27 DIAGNOSIS — L97819 Non-pressure chronic ulcer of other part of right lower leg with unspecified severity: Secondary | ICD-10-CM | POA: Diagnosis not present

## 2016-02-28 ENCOUNTER — Ambulatory Visit: Payer: Self-pay | Admitting: Neurology

## 2016-03-04 DIAGNOSIS — E119 Type 2 diabetes mellitus without complications: Secondary | ICD-10-CM | POA: Diagnosis not present

## 2016-03-04 DIAGNOSIS — I272 Other secondary pulmonary hypertension: Secondary | ICD-10-CM | POA: Diagnosis not present

## 2016-03-04 DIAGNOSIS — R42 Dizziness and giddiness: Secondary | ICD-10-CM | POA: Diagnosis not present

## 2016-03-04 DIAGNOSIS — I482 Chronic atrial fibrillation: Secondary | ICD-10-CM | POA: Diagnosis not present

## 2016-03-04 DIAGNOSIS — G459 Transient cerebral ischemic attack, unspecified: Secondary | ICD-10-CM | POA: Diagnosis not present

## 2016-03-04 DIAGNOSIS — I671 Cerebral aneurysm, nonruptured: Secondary | ICD-10-CM | POA: Diagnosis not present

## 2016-03-04 DIAGNOSIS — S8255XA Nondisplaced fracture of medial malleolus of left tibia, initial encounter for closed fracture: Secondary | ICD-10-CM | POA: Diagnosis not present

## 2016-03-04 DIAGNOSIS — Q909 Down syndrome, unspecified: Secondary | ICD-10-CM | POA: Diagnosis not present

## 2016-03-04 DIAGNOSIS — I5042 Chronic combined systolic (congestive) and diastolic (congestive) heart failure: Secondary | ICD-10-CM | POA: Diagnosis not present

## 2016-03-04 DIAGNOSIS — M6281 Muscle weakness (generalized): Secondary | ICD-10-CM | POA: Diagnosis not present

## 2016-03-04 DIAGNOSIS — M810 Age-related osteoporosis without current pathological fracture: Secondary | ICD-10-CM | POA: Diagnosis not present

## 2016-03-04 DIAGNOSIS — E559 Vitamin D deficiency, unspecified: Secondary | ICD-10-CM | POA: Diagnosis not present

## 2016-03-05 DIAGNOSIS — L97819 Non-pressure chronic ulcer of other part of right lower leg with unspecified severity: Secondary | ICD-10-CM | POA: Diagnosis not present

## 2016-03-09 ENCOUNTER — Encounter: Payer: Self-pay | Admitting: Orthopedic Surgery

## 2016-03-09 ENCOUNTER — Ambulatory Visit: Payer: Medicare Other

## 2016-03-12 DIAGNOSIS — H00012 Hordeolum externum right lower eyelid: Secondary | ICD-10-CM | POA: Diagnosis not present

## 2016-03-12 DIAGNOSIS — L738 Other specified follicular disorders: Secondary | ICD-10-CM | POA: Diagnosis not present

## 2016-03-12 DIAGNOSIS — Z7901 Long term (current) use of anticoagulants: Secondary | ICD-10-CM | POA: Diagnosis not present

## 2016-03-17 DIAGNOSIS — M25572 Pain in left ankle and joints of left foot: Secondary | ICD-10-CM | POA: Diagnosis not present

## 2016-03-20 ENCOUNTER — Encounter: Payer: Self-pay | Admitting: Cardiology

## 2016-03-20 ENCOUNTER — Telehealth: Payer: Self-pay | Admitting: Cardiology

## 2016-03-20 DIAGNOSIS — M6281 Muscle weakness (generalized): Secondary | ICD-10-CM | POA: Diagnosis not present

## 2016-03-20 NOTE — Progress Notes (Signed)
Visit canceled per family.  This encounter was created in error - please disregard.

## 2016-03-20 NOTE — Telephone Encounter (Signed)
The patient's sister notified our office that Angel Torres has been transferred to a skilled nursing facility in [place] and will not be able to reschedule any follow-up appointments at this time.  [name] will notify us if Angel Torres is relocated to the [place] area and needs to be seen for further cardiac care.  Routing to Dr. Diona BrownerMcDowell to make him aware.

## 2016-03-26 DIAGNOSIS — M6281 Muscle weakness (generalized): Secondary | ICD-10-CM | POA: Diagnosis not present

## 2016-03-26 DIAGNOSIS — M810 Age-related osteoporosis without current pathological fracture: Secondary | ICD-10-CM | POA: Diagnosis not present

## 2016-03-26 DIAGNOSIS — G459 Transient cerebral ischemic attack, unspecified: Secondary | ICD-10-CM | POA: Diagnosis not present

## 2016-03-26 DIAGNOSIS — I482 Chronic atrial fibrillation: Secondary | ICD-10-CM | POA: Diagnosis not present

## 2016-03-26 DIAGNOSIS — R42 Dizziness and giddiness: Secondary | ICD-10-CM | POA: Diagnosis not present

## 2016-03-26 DIAGNOSIS — I272 Other secondary pulmonary hypertension: Secondary | ICD-10-CM | POA: Diagnosis not present

## 2016-03-26 DIAGNOSIS — Q909 Down syndrome, unspecified: Secondary | ICD-10-CM | POA: Diagnosis not present

## 2016-03-26 DIAGNOSIS — I671 Cerebral aneurysm, nonruptured: Secondary | ICD-10-CM | POA: Diagnosis not present

## 2016-03-26 DIAGNOSIS — I5042 Chronic combined systolic (congestive) and diastolic (congestive) heart failure: Secondary | ICD-10-CM | POA: Diagnosis not present

## 2016-03-26 DIAGNOSIS — E119 Type 2 diabetes mellitus without complications: Secondary | ICD-10-CM | POA: Diagnosis not present

## 2016-03-26 DIAGNOSIS — S8255XA Nondisplaced fracture of medial malleolus of left tibia, initial encounter for closed fracture: Secondary | ICD-10-CM | POA: Diagnosis not present

## 2016-03-26 DIAGNOSIS — E559 Vitamin D deficiency, unspecified: Secondary | ICD-10-CM | POA: Diagnosis not present

## 2016-03-30 DIAGNOSIS — Z7901 Long term (current) use of anticoagulants: Secondary | ICD-10-CM | POA: Diagnosis not present

## 2016-03-31 DIAGNOSIS — R42 Dizziness and giddiness: Secondary | ICD-10-CM | POA: Diagnosis not present

## 2016-03-31 DIAGNOSIS — M810 Age-related osteoporosis without current pathological fracture: Secondary | ICD-10-CM | POA: Diagnosis not present

## 2016-03-31 DIAGNOSIS — G459 Transient cerebral ischemic attack, unspecified: Secondary | ICD-10-CM | POA: Diagnosis not present

## 2016-03-31 DIAGNOSIS — I482 Chronic atrial fibrillation: Secondary | ICD-10-CM | POA: Diagnosis not present

## 2016-03-31 DIAGNOSIS — I272 Other secondary pulmonary hypertension: Secondary | ICD-10-CM | POA: Diagnosis not present

## 2016-03-31 DIAGNOSIS — E119 Type 2 diabetes mellitus without complications: Secondary | ICD-10-CM | POA: Diagnosis not present

## 2016-03-31 DIAGNOSIS — E559 Vitamin D deficiency, unspecified: Secondary | ICD-10-CM | POA: Diagnosis not present

## 2016-03-31 DIAGNOSIS — I5042 Chronic combined systolic (congestive) and diastolic (congestive) heart failure: Secondary | ICD-10-CM | POA: Diagnosis not present

## 2016-03-31 DIAGNOSIS — M6281 Muscle weakness (generalized): Secondary | ICD-10-CM | POA: Diagnosis not present

## 2016-03-31 DIAGNOSIS — S8255XA Nondisplaced fracture of medial malleolus of left tibia, initial encounter for closed fracture: Secondary | ICD-10-CM | POA: Diagnosis not present

## 2016-03-31 DIAGNOSIS — I671 Cerebral aneurysm, nonruptured: Secondary | ICD-10-CM | POA: Diagnosis not present

## 2016-03-31 DIAGNOSIS — Q909 Down syndrome, unspecified: Secondary | ICD-10-CM | POA: Diagnosis not present

## 2016-04-07 DIAGNOSIS — Z7901 Long term (current) use of anticoagulants: Secondary | ICD-10-CM | POA: Diagnosis not present

## 2016-04-10 DIAGNOSIS — Z7901 Long term (current) use of anticoagulants: Secondary | ICD-10-CM | POA: Diagnosis not present

## 2016-04-13 DIAGNOSIS — Z7901 Long term (current) use of anticoagulants: Secondary | ICD-10-CM | POA: Diagnosis not present

## 2016-04-14 DIAGNOSIS — Z7901 Long term (current) use of anticoagulants: Secondary | ICD-10-CM | POA: Diagnosis not present

## 2016-04-16 DIAGNOSIS — Z7901 Long term (current) use of anticoagulants: Secondary | ICD-10-CM | POA: Diagnosis not present

## 2016-04-17 DIAGNOSIS — Z7901 Long term (current) use of anticoagulants: Secondary | ICD-10-CM | POA: Diagnosis not present

## 2016-04-21 DIAGNOSIS — I482 Chronic atrial fibrillation: Secondary | ICD-10-CM | POA: Diagnosis not present

## 2016-04-21 DIAGNOSIS — Q909 Down syndrome, unspecified: Secondary | ICD-10-CM | POA: Diagnosis not present

## 2016-04-21 DIAGNOSIS — M6281 Muscle weakness (generalized): Secondary | ICD-10-CM | POA: Diagnosis not present

## 2016-04-21 DIAGNOSIS — S8255XA Nondisplaced fracture of medial malleolus of left tibia, initial encounter for closed fracture: Secondary | ICD-10-CM | POA: Diagnosis not present

## 2016-04-21 DIAGNOSIS — R42 Dizziness and giddiness: Secondary | ICD-10-CM | POA: Diagnosis not present

## 2016-04-21 DIAGNOSIS — Z79899 Other long term (current) drug therapy: Secondary | ICD-10-CM | POA: Diagnosis not present

## 2016-04-21 DIAGNOSIS — E559 Vitamin D deficiency, unspecified: Secondary | ICD-10-CM | POA: Diagnosis not present

## 2016-04-21 DIAGNOSIS — G459 Transient cerebral ischemic attack, unspecified: Secondary | ICD-10-CM | POA: Diagnosis not present

## 2016-04-21 DIAGNOSIS — I272 Other secondary pulmonary hypertension: Secondary | ICD-10-CM | POA: Diagnosis not present

## 2016-04-21 DIAGNOSIS — Z7901 Long term (current) use of anticoagulants: Secondary | ICD-10-CM | POA: Diagnosis not present

## 2016-04-21 DIAGNOSIS — I5042 Chronic combined systolic (congestive) and diastolic (congestive) heart failure: Secondary | ICD-10-CM | POA: Diagnosis not present

## 2016-04-21 DIAGNOSIS — M810 Age-related osteoporosis without current pathological fracture: Secondary | ICD-10-CM | POA: Diagnosis not present

## 2016-04-21 DIAGNOSIS — I671 Cerebral aneurysm, nonruptured: Secondary | ICD-10-CM | POA: Diagnosis not present

## 2016-04-21 DIAGNOSIS — E119 Type 2 diabetes mellitus without complications: Secondary | ICD-10-CM | POA: Diagnosis not present

## 2016-04-24 DIAGNOSIS — I5042 Chronic combined systolic (congestive) and diastolic (congestive) heart failure: Secondary | ICD-10-CM | POA: Diagnosis not present

## 2016-04-24 DIAGNOSIS — I482 Chronic atrial fibrillation: Secondary | ICD-10-CM | POA: Diagnosis not present

## 2016-04-24 DIAGNOSIS — Z7901 Long term (current) use of anticoagulants: Secondary | ICD-10-CM | POA: Diagnosis not present

## 2016-04-24 DIAGNOSIS — I10 Essential (primary) hypertension: Secondary | ICD-10-CM | POA: Diagnosis not present

## 2016-04-24 DIAGNOSIS — R001 Bradycardia, unspecified: Secondary | ICD-10-CM | POA: Diagnosis not present

## 2016-04-27 DIAGNOSIS — Z7901 Long term (current) use of anticoagulants: Secondary | ICD-10-CM | POA: Diagnosis not present

## 2016-04-28 DIAGNOSIS — Z7901 Long term (current) use of anticoagulants: Secondary | ICD-10-CM | POA: Diagnosis not present

## 2016-04-29 DIAGNOSIS — I671 Cerebral aneurysm, nonruptured: Secondary | ICD-10-CM | POA: Diagnosis not present

## 2016-04-29 DIAGNOSIS — Q909 Down syndrome, unspecified: Secondary | ICD-10-CM | POA: Diagnosis not present

## 2016-04-29 DIAGNOSIS — I5042 Chronic combined systolic (congestive) and diastolic (congestive) heart failure: Secondary | ICD-10-CM | POA: Diagnosis not present

## 2016-04-29 DIAGNOSIS — I272 Other secondary pulmonary hypertension: Secondary | ICD-10-CM | POA: Diagnosis not present

## 2016-04-29 DIAGNOSIS — M810 Age-related osteoporosis without current pathological fracture: Secondary | ICD-10-CM | POA: Diagnosis not present

## 2016-04-29 DIAGNOSIS — R42 Dizziness and giddiness: Secondary | ICD-10-CM | POA: Diagnosis not present

## 2016-04-29 DIAGNOSIS — G459 Transient cerebral ischemic attack, unspecified: Secondary | ICD-10-CM | POA: Diagnosis not present

## 2016-04-29 DIAGNOSIS — I482 Chronic atrial fibrillation: Secondary | ICD-10-CM | POA: Diagnosis not present

## 2016-04-29 DIAGNOSIS — E559 Vitamin D deficiency, unspecified: Secondary | ICD-10-CM | POA: Diagnosis not present

## 2016-04-29 DIAGNOSIS — M6281 Muscle weakness (generalized): Secondary | ICD-10-CM | POA: Diagnosis not present

## 2016-04-29 DIAGNOSIS — S8255XA Nondisplaced fracture of medial malleolus of left tibia, initial encounter for closed fracture: Secondary | ICD-10-CM | POA: Diagnosis not present

## 2016-04-29 DIAGNOSIS — E119 Type 2 diabetes mellitus without complications: Secondary | ICD-10-CM | POA: Diagnosis not present

## 2016-04-30 DIAGNOSIS — Z79899 Other long term (current) drug therapy: Secondary | ICD-10-CM | POA: Diagnosis not present

## 2016-04-30 DIAGNOSIS — I482 Chronic atrial fibrillation: Secondary | ICD-10-CM | POA: Diagnosis not present

## 2016-05-01 DIAGNOSIS — Z7901 Long term (current) use of anticoagulants: Secondary | ICD-10-CM | POA: Diagnosis not present

## 2016-05-04 DIAGNOSIS — I482 Chronic atrial fibrillation: Secondary | ICD-10-CM | POA: Diagnosis not present

## 2016-05-05 DIAGNOSIS — Z7901 Long term (current) use of anticoagulants: Secondary | ICD-10-CM | POA: Diagnosis not present

## 2016-05-07 DIAGNOSIS — Z7901 Long term (current) use of anticoagulants: Secondary | ICD-10-CM | POA: Diagnosis not present

## 2016-05-08 DIAGNOSIS — Z7901 Long term (current) use of anticoagulants: Secondary | ICD-10-CM | POA: Diagnosis not present

## 2016-05-11 DIAGNOSIS — Z7901 Long term (current) use of anticoagulants: Secondary | ICD-10-CM | POA: Diagnosis not present

## 2016-05-12 DIAGNOSIS — Z7901 Long term (current) use of anticoagulants: Secondary | ICD-10-CM | POA: Diagnosis not present

## 2016-05-14 DIAGNOSIS — Z7901 Long term (current) use of anticoagulants: Secondary | ICD-10-CM | POA: Diagnosis not present

## 2016-05-18 DIAGNOSIS — Z7901 Long term (current) use of anticoagulants: Secondary | ICD-10-CM | POA: Diagnosis not present

## 2016-05-25 DIAGNOSIS — S8255XA Nondisplaced fracture of medial malleolus of left tibia, initial encounter for closed fracture: Secondary | ICD-10-CM | POA: Diagnosis not present

## 2016-05-25 DIAGNOSIS — E559 Vitamin D deficiency, unspecified: Secondary | ICD-10-CM | POA: Diagnosis not present

## 2016-05-25 DIAGNOSIS — I482 Chronic atrial fibrillation: Secondary | ICD-10-CM | POA: Diagnosis not present

## 2016-05-25 DIAGNOSIS — G459 Transient cerebral ischemic attack, unspecified: Secondary | ICD-10-CM | POA: Diagnosis not present

## 2016-05-25 DIAGNOSIS — R42 Dizziness and giddiness: Secondary | ICD-10-CM | POA: Diagnosis not present

## 2016-05-25 DIAGNOSIS — E119 Type 2 diabetes mellitus without complications: Secondary | ICD-10-CM | POA: Diagnosis not present

## 2016-05-25 DIAGNOSIS — M810 Age-related osteoporosis without current pathological fracture: Secondary | ICD-10-CM | POA: Diagnosis not present

## 2016-05-25 DIAGNOSIS — R54 Age-related physical debility: Secondary | ICD-10-CM | POA: Diagnosis not present

## 2016-05-25 DIAGNOSIS — Z7901 Long term (current) use of anticoagulants: Secondary | ICD-10-CM | POA: Diagnosis not present

## 2016-05-25 DIAGNOSIS — M6281 Muscle weakness (generalized): Secondary | ICD-10-CM | POA: Diagnosis not present

## 2016-05-25 DIAGNOSIS — I5042 Chronic combined systolic (congestive) and diastolic (congestive) heart failure: Secondary | ICD-10-CM | POA: Diagnosis not present

## 2016-06-02 DIAGNOSIS — Z7901 Long term (current) use of anticoagulants: Secondary | ICD-10-CM | POA: Diagnosis not present

## 2016-06-05 DIAGNOSIS — I482 Chronic atrial fibrillation: Secondary | ICD-10-CM | POA: Diagnosis not present

## 2016-06-12 DIAGNOSIS — Z7901 Long term (current) use of anticoagulants: Secondary | ICD-10-CM | POA: Diagnosis not present

## 2016-06-15 DIAGNOSIS — Z7901 Long term (current) use of anticoagulants: Secondary | ICD-10-CM | POA: Diagnosis not present

## 2016-06-22 DIAGNOSIS — Z7901 Long term (current) use of anticoagulants: Secondary | ICD-10-CM | POA: Diagnosis not present

## 2016-06-24 DIAGNOSIS — M6281 Muscle weakness (generalized): Secondary | ICD-10-CM | POA: Diagnosis not present

## 2016-06-24 DIAGNOSIS — R05 Cough: Secondary | ICD-10-CM | POA: Diagnosis not present

## 2016-06-24 DIAGNOSIS — Q909 Down syndrome, unspecified: Secondary | ICD-10-CM | POA: Diagnosis not present

## 2016-06-24 DIAGNOSIS — R279 Unspecified lack of coordination: Secondary | ICD-10-CM | POA: Diagnosis not present

## 2016-06-24 DIAGNOSIS — S8255XD Nondisplaced fracture of medial malleolus of left tibia, subsequent encounter for closed fracture with routine healing: Secondary | ICD-10-CM | POA: Diagnosis not present

## 2016-06-24 DIAGNOSIS — R4182 Altered mental status, unspecified: Secondary | ICD-10-CM | POA: Diagnosis not present

## 2016-06-24 DIAGNOSIS — I10 Essential (primary) hypertension: Secondary | ICD-10-CM | POA: Diagnosis not present

## 2016-06-24 DIAGNOSIS — M810 Age-related osteoporosis without current pathological fracture: Secondary | ICD-10-CM | POA: Diagnosis not present

## 2016-06-24 DIAGNOSIS — R42 Dizziness and giddiness: Secondary | ICD-10-CM | POA: Diagnosis not present

## 2016-06-24 DIAGNOSIS — I5042 Chronic combined systolic (congestive) and diastolic (congestive) heart failure: Secondary | ICD-10-CM | POA: Diagnosis not present

## 2016-06-24 DIAGNOSIS — I482 Chronic atrial fibrillation: Secondary | ICD-10-CM | POA: Diagnosis not present

## 2016-06-24 DIAGNOSIS — G459 Transient cerebral ischemic attack, unspecified: Secondary | ICD-10-CM | POA: Diagnosis not present

## 2016-06-24 DIAGNOSIS — E119 Type 2 diabetes mellitus without complications: Secondary | ICD-10-CM | POA: Diagnosis not present

## 2016-06-24 DIAGNOSIS — I671 Cerebral aneurysm, nonruptured: Secondary | ICD-10-CM | POA: Diagnosis not present

## 2016-06-24 DIAGNOSIS — R4781 Slurred speech: Secondary | ICD-10-CM | POA: Diagnosis not present

## 2016-06-25 DIAGNOSIS — G459 Transient cerebral ischemic attack, unspecified: Secondary | ICD-10-CM | POA: Diagnosis not present

## 2016-06-25 DIAGNOSIS — S8255XD Nondisplaced fracture of medial malleolus of left tibia, subsequent encounter for closed fracture with routine healing: Secondary | ICD-10-CM | POA: Diagnosis not present

## 2016-06-25 DIAGNOSIS — M6281 Muscle weakness (generalized): Secondary | ICD-10-CM | POA: Diagnosis not present

## 2016-06-25 DIAGNOSIS — R279 Unspecified lack of coordination: Secondary | ICD-10-CM | POA: Diagnosis not present

## 2016-06-25 DIAGNOSIS — R4781 Slurred speech: Secondary | ICD-10-CM | POA: Diagnosis not present

## 2016-06-25 DIAGNOSIS — R4182 Altered mental status, unspecified: Secondary | ICD-10-CM | POA: Diagnosis not present

## 2016-06-26 DIAGNOSIS — R4182 Altered mental status, unspecified: Secondary | ICD-10-CM | POA: Diagnosis not present

## 2016-06-26 DIAGNOSIS — R4781 Slurred speech: Secondary | ICD-10-CM | POA: Diagnosis not present

## 2016-06-26 DIAGNOSIS — M6281 Muscle weakness (generalized): Secondary | ICD-10-CM | POA: Diagnosis not present

## 2016-06-26 DIAGNOSIS — S8255XD Nondisplaced fracture of medial malleolus of left tibia, subsequent encounter for closed fracture with routine healing: Secondary | ICD-10-CM | POA: Diagnosis not present

## 2016-06-26 DIAGNOSIS — G459 Transient cerebral ischemic attack, unspecified: Secondary | ICD-10-CM | POA: Diagnosis not present

## 2016-06-26 DIAGNOSIS — R279 Unspecified lack of coordination: Secondary | ICD-10-CM | POA: Diagnosis not present

## 2016-06-29 DIAGNOSIS — G459 Transient cerebral ischemic attack, unspecified: Secondary | ICD-10-CM | POA: Diagnosis not present

## 2016-06-29 DIAGNOSIS — M6281 Muscle weakness (generalized): Secondary | ICD-10-CM | POA: Diagnosis not present

## 2016-06-29 DIAGNOSIS — R4182 Altered mental status, unspecified: Secondary | ICD-10-CM | POA: Diagnosis not present

## 2016-06-29 DIAGNOSIS — Z7901 Long term (current) use of anticoagulants: Secondary | ICD-10-CM | POA: Diagnosis not present

## 2016-06-29 DIAGNOSIS — S8255XD Nondisplaced fracture of medial malleolus of left tibia, subsequent encounter for closed fracture with routine healing: Secondary | ICD-10-CM | POA: Diagnosis not present

## 2016-06-29 DIAGNOSIS — R4781 Slurred speech: Secondary | ICD-10-CM | POA: Diagnosis not present

## 2016-06-29 DIAGNOSIS — R279 Unspecified lack of coordination: Secondary | ICD-10-CM | POA: Diagnosis not present

## 2016-06-30 DIAGNOSIS — M25541 Pain in joints of right hand: Secondary | ICD-10-CM | POA: Diagnosis not present

## 2016-06-30 DIAGNOSIS — M24571 Contracture, right ankle: Secondary | ICD-10-CM | POA: Diagnosis not present

## 2016-06-30 DIAGNOSIS — S8255XD Nondisplaced fracture of medial malleolus of left tibia, subsequent encounter for closed fracture with routine healing: Secondary | ICD-10-CM | POA: Diagnosis not present

## 2016-06-30 DIAGNOSIS — R4781 Slurred speech: Secondary | ICD-10-CM | POA: Diagnosis not present

## 2016-06-30 DIAGNOSIS — M6281 Muscle weakness (generalized): Secondary | ICD-10-CM | POA: Diagnosis not present

## 2016-06-30 DIAGNOSIS — Q909 Down syndrome, unspecified: Secondary | ICD-10-CM | POA: Diagnosis not present

## 2016-06-30 DIAGNOSIS — R279 Unspecified lack of coordination: Secondary | ICD-10-CM | POA: Diagnosis not present

## 2016-06-30 DIAGNOSIS — R4182 Altered mental status, unspecified: Secondary | ICD-10-CM | POA: Diagnosis not present

## 2016-06-30 DIAGNOSIS — G459 Transient cerebral ischemic attack, unspecified: Secondary | ICD-10-CM | POA: Diagnosis not present

## 2016-07-01 DIAGNOSIS — R4781 Slurred speech: Secondary | ICD-10-CM | POA: Diagnosis not present

## 2016-07-01 DIAGNOSIS — G459 Transient cerebral ischemic attack, unspecified: Secondary | ICD-10-CM | POA: Diagnosis not present

## 2016-07-01 DIAGNOSIS — M6281 Muscle weakness (generalized): Secondary | ICD-10-CM | POA: Diagnosis not present

## 2016-07-01 DIAGNOSIS — R279 Unspecified lack of coordination: Secondary | ICD-10-CM | POA: Diagnosis not present

## 2016-07-01 DIAGNOSIS — S8255XD Nondisplaced fracture of medial malleolus of left tibia, subsequent encounter for closed fracture with routine healing: Secondary | ICD-10-CM | POA: Diagnosis not present

## 2016-07-01 DIAGNOSIS — R4182 Altered mental status, unspecified: Secondary | ICD-10-CM | POA: Diagnosis not present

## 2016-07-02 DIAGNOSIS — R279 Unspecified lack of coordination: Secondary | ICD-10-CM | POA: Diagnosis not present

## 2016-07-02 DIAGNOSIS — M6281 Muscle weakness (generalized): Secondary | ICD-10-CM | POA: Diagnosis not present

## 2016-07-02 DIAGNOSIS — R4182 Altered mental status, unspecified: Secondary | ICD-10-CM | POA: Diagnosis not present

## 2016-07-02 DIAGNOSIS — R4781 Slurred speech: Secondary | ICD-10-CM | POA: Diagnosis not present

## 2016-07-02 DIAGNOSIS — G459 Transient cerebral ischemic attack, unspecified: Secondary | ICD-10-CM | POA: Diagnosis not present

## 2016-07-02 DIAGNOSIS — Z7901 Long term (current) use of anticoagulants: Secondary | ICD-10-CM | POA: Diagnosis not present

## 2016-07-02 DIAGNOSIS — S8255XD Nondisplaced fracture of medial malleolus of left tibia, subsequent encounter for closed fracture with routine healing: Secondary | ICD-10-CM | POA: Diagnosis not present

## 2016-07-03 DIAGNOSIS — R279 Unspecified lack of coordination: Secondary | ICD-10-CM | POA: Diagnosis not present

## 2016-07-03 DIAGNOSIS — M6281 Muscle weakness (generalized): Secondary | ICD-10-CM | POA: Diagnosis not present

## 2016-07-03 DIAGNOSIS — R4182 Altered mental status, unspecified: Secondary | ICD-10-CM | POA: Diagnosis not present

## 2016-07-03 DIAGNOSIS — R4781 Slurred speech: Secondary | ICD-10-CM | POA: Diagnosis not present

## 2016-07-03 DIAGNOSIS — G459 Transient cerebral ischemic attack, unspecified: Secondary | ICD-10-CM | POA: Diagnosis not present

## 2016-07-03 DIAGNOSIS — S8255XD Nondisplaced fracture of medial malleolus of left tibia, subsequent encounter for closed fracture with routine healing: Secondary | ICD-10-CM | POA: Diagnosis not present

## 2016-07-06 DIAGNOSIS — R4182 Altered mental status, unspecified: Secondary | ICD-10-CM | POA: Diagnosis not present

## 2016-07-06 DIAGNOSIS — R4781 Slurred speech: Secondary | ICD-10-CM | POA: Diagnosis not present

## 2016-07-06 DIAGNOSIS — G459 Transient cerebral ischemic attack, unspecified: Secondary | ICD-10-CM | POA: Diagnosis not present

## 2016-07-06 DIAGNOSIS — M6281 Muscle weakness (generalized): Secondary | ICD-10-CM | POA: Diagnosis not present

## 2016-07-06 DIAGNOSIS — R279 Unspecified lack of coordination: Secondary | ICD-10-CM | POA: Diagnosis not present

## 2016-07-06 DIAGNOSIS — S8255XD Nondisplaced fracture of medial malleolus of left tibia, subsequent encounter for closed fracture with routine healing: Secondary | ICD-10-CM | POA: Diagnosis not present

## 2016-07-06 DIAGNOSIS — Z79899 Other long term (current) drug therapy: Secondary | ICD-10-CM | POA: Diagnosis not present

## 2016-07-07 DIAGNOSIS — R279 Unspecified lack of coordination: Secondary | ICD-10-CM | POA: Diagnosis not present

## 2016-07-07 DIAGNOSIS — M6281 Muscle weakness (generalized): Secondary | ICD-10-CM | POA: Diagnosis not present

## 2016-07-07 DIAGNOSIS — S8255XD Nondisplaced fracture of medial malleolus of left tibia, subsequent encounter for closed fracture with routine healing: Secondary | ICD-10-CM | POA: Diagnosis not present

## 2016-07-07 DIAGNOSIS — R4182 Altered mental status, unspecified: Secondary | ICD-10-CM | POA: Diagnosis not present

## 2016-07-07 DIAGNOSIS — R4781 Slurred speech: Secondary | ICD-10-CM | POA: Diagnosis not present

## 2016-07-07 DIAGNOSIS — G459 Transient cerebral ischemic attack, unspecified: Secondary | ICD-10-CM | POA: Diagnosis not present

## 2016-07-08 DIAGNOSIS — E119 Type 2 diabetes mellitus without complications: Secondary | ICD-10-CM | POA: Diagnosis not present

## 2016-07-08 DIAGNOSIS — I272 Other secondary pulmonary hypertension: Secondary | ICD-10-CM | POA: Diagnosis not present

## 2016-07-08 DIAGNOSIS — Q909 Down syndrome, unspecified: Secondary | ICD-10-CM | POA: Diagnosis not present

## 2016-07-08 DIAGNOSIS — R4781 Slurred speech: Secondary | ICD-10-CM | POA: Diagnosis not present

## 2016-07-08 DIAGNOSIS — G459 Transient cerebral ischemic attack, unspecified: Secondary | ICD-10-CM | POA: Diagnosis not present

## 2016-07-08 DIAGNOSIS — I482 Chronic atrial fibrillation: Secondary | ICD-10-CM | POA: Diagnosis not present

## 2016-07-08 DIAGNOSIS — E559 Vitamin D deficiency, unspecified: Secondary | ICD-10-CM | POA: Diagnosis not present

## 2016-07-08 DIAGNOSIS — R279 Unspecified lack of coordination: Secondary | ICD-10-CM | POA: Diagnosis not present

## 2016-07-08 DIAGNOSIS — R42 Dizziness and giddiness: Secondary | ICD-10-CM | POA: Diagnosis not present

## 2016-07-08 DIAGNOSIS — S8255XD Nondisplaced fracture of medial malleolus of left tibia, subsequent encounter for closed fracture with routine healing: Secondary | ICD-10-CM | POA: Diagnosis not present

## 2016-07-08 DIAGNOSIS — I671 Cerebral aneurysm, nonruptured: Secondary | ICD-10-CM | POA: Diagnosis not present

## 2016-07-08 DIAGNOSIS — M6281 Muscle weakness (generalized): Secondary | ICD-10-CM | POA: Diagnosis not present

## 2016-07-08 DIAGNOSIS — I5042 Chronic combined systolic (congestive) and diastolic (congestive) heart failure: Secondary | ICD-10-CM | POA: Diagnosis not present

## 2016-07-08 DIAGNOSIS — R4182 Altered mental status, unspecified: Secondary | ICD-10-CM | POA: Diagnosis not present

## 2016-07-08 DIAGNOSIS — R54 Age-related physical debility: Secondary | ICD-10-CM | POA: Diagnosis not present

## 2016-07-08 DIAGNOSIS — M810 Age-related osteoporosis without current pathological fracture: Secondary | ICD-10-CM | POA: Diagnosis not present

## 2016-07-09 DIAGNOSIS — R4781 Slurred speech: Secondary | ICD-10-CM | POA: Diagnosis not present

## 2016-07-09 DIAGNOSIS — Z7901 Long term (current) use of anticoagulants: Secondary | ICD-10-CM | POA: Diagnosis not present

## 2016-07-09 DIAGNOSIS — S8255XD Nondisplaced fracture of medial malleolus of left tibia, subsequent encounter for closed fracture with routine healing: Secondary | ICD-10-CM | POA: Diagnosis not present

## 2016-07-09 DIAGNOSIS — R4182 Altered mental status, unspecified: Secondary | ICD-10-CM | POA: Diagnosis not present

## 2016-07-09 DIAGNOSIS — M6281 Muscle weakness (generalized): Secondary | ICD-10-CM | POA: Diagnosis not present

## 2016-07-09 DIAGNOSIS — R279 Unspecified lack of coordination: Secondary | ICD-10-CM | POA: Diagnosis not present

## 2016-07-09 DIAGNOSIS — G459 Transient cerebral ischemic attack, unspecified: Secondary | ICD-10-CM | POA: Diagnosis not present

## 2016-07-10 DIAGNOSIS — S8255XD Nondisplaced fracture of medial malleolus of left tibia, subsequent encounter for closed fracture with routine healing: Secondary | ICD-10-CM | POA: Diagnosis not present

## 2016-07-10 DIAGNOSIS — M6281 Muscle weakness (generalized): Secondary | ICD-10-CM | POA: Diagnosis not present

## 2016-07-10 DIAGNOSIS — Z7901 Long term (current) use of anticoagulants: Secondary | ICD-10-CM | POA: Diagnosis not present

## 2016-07-10 DIAGNOSIS — G459 Transient cerebral ischemic attack, unspecified: Secondary | ICD-10-CM | POA: Diagnosis not present

## 2016-07-10 DIAGNOSIS — R4182 Altered mental status, unspecified: Secondary | ICD-10-CM | POA: Diagnosis not present

## 2016-07-10 DIAGNOSIS — R279 Unspecified lack of coordination: Secondary | ICD-10-CM | POA: Diagnosis not present

## 2016-07-10 DIAGNOSIS — R4781 Slurred speech: Secondary | ICD-10-CM | POA: Diagnosis not present

## 2016-07-13 DIAGNOSIS — R279 Unspecified lack of coordination: Secondary | ICD-10-CM | POA: Diagnosis not present

## 2016-07-13 DIAGNOSIS — I4891 Unspecified atrial fibrillation: Secondary | ICD-10-CM | POA: Diagnosis not present

## 2016-07-13 DIAGNOSIS — S8255XD Nondisplaced fracture of medial malleolus of left tibia, subsequent encounter for closed fracture with routine healing: Secondary | ICD-10-CM | POA: Diagnosis not present

## 2016-07-13 DIAGNOSIS — R4182 Altered mental status, unspecified: Secondary | ICD-10-CM | POA: Diagnosis not present

## 2016-07-13 DIAGNOSIS — Z7901 Long term (current) use of anticoagulants: Secondary | ICD-10-CM | POA: Diagnosis not present

## 2016-07-13 DIAGNOSIS — G459 Transient cerebral ischemic attack, unspecified: Secondary | ICD-10-CM | POA: Diagnosis not present

## 2016-07-13 DIAGNOSIS — M6281 Muscle weakness (generalized): Secondary | ICD-10-CM | POA: Diagnosis not present

## 2016-07-13 DIAGNOSIS — R4781 Slurred speech: Secondary | ICD-10-CM | POA: Diagnosis not present

## 2016-07-14 DIAGNOSIS — R4781 Slurred speech: Secondary | ICD-10-CM | POA: Diagnosis not present

## 2016-07-14 DIAGNOSIS — S8255XD Nondisplaced fracture of medial malleolus of left tibia, subsequent encounter for closed fracture with routine healing: Secondary | ICD-10-CM | POA: Diagnosis not present

## 2016-07-14 DIAGNOSIS — R4182 Altered mental status, unspecified: Secondary | ICD-10-CM | POA: Diagnosis not present

## 2016-07-14 DIAGNOSIS — M6281 Muscle weakness (generalized): Secondary | ICD-10-CM | POA: Diagnosis not present

## 2016-07-14 DIAGNOSIS — Z7901 Long term (current) use of anticoagulants: Secondary | ICD-10-CM | POA: Diagnosis not present

## 2016-07-14 DIAGNOSIS — R279 Unspecified lack of coordination: Secondary | ICD-10-CM | POA: Diagnosis not present

## 2016-07-14 DIAGNOSIS — G459 Transient cerebral ischemic attack, unspecified: Secondary | ICD-10-CM | POA: Diagnosis not present

## 2016-07-15 DIAGNOSIS — R279 Unspecified lack of coordination: Secondary | ICD-10-CM | POA: Diagnosis not present

## 2016-07-15 DIAGNOSIS — S8255XD Nondisplaced fracture of medial malleolus of left tibia, subsequent encounter for closed fracture with routine healing: Secondary | ICD-10-CM | POA: Diagnosis not present

## 2016-07-15 DIAGNOSIS — G459 Transient cerebral ischemic attack, unspecified: Secondary | ICD-10-CM | POA: Diagnosis not present

## 2016-07-15 DIAGNOSIS — R4182 Altered mental status, unspecified: Secondary | ICD-10-CM | POA: Diagnosis not present

## 2016-07-15 DIAGNOSIS — M6281 Muscle weakness (generalized): Secondary | ICD-10-CM | POA: Diagnosis not present

## 2016-07-15 DIAGNOSIS — R4781 Slurred speech: Secondary | ICD-10-CM | POA: Diagnosis not present

## 2016-07-16 DIAGNOSIS — R4182 Altered mental status, unspecified: Secondary | ICD-10-CM | POA: Diagnosis not present

## 2016-07-16 DIAGNOSIS — R4781 Slurred speech: Secondary | ICD-10-CM | POA: Diagnosis not present

## 2016-07-16 DIAGNOSIS — G459 Transient cerebral ischemic attack, unspecified: Secondary | ICD-10-CM | POA: Diagnosis not present

## 2016-07-16 DIAGNOSIS — R279 Unspecified lack of coordination: Secondary | ICD-10-CM | POA: Diagnosis not present

## 2016-07-16 DIAGNOSIS — M6281 Muscle weakness (generalized): Secondary | ICD-10-CM | POA: Diagnosis not present

## 2016-07-16 DIAGNOSIS — S8255XD Nondisplaced fracture of medial malleolus of left tibia, subsequent encounter for closed fracture with routine healing: Secondary | ICD-10-CM | POA: Diagnosis not present

## 2016-07-17 DIAGNOSIS — R4182 Altered mental status, unspecified: Secondary | ICD-10-CM | POA: Diagnosis not present

## 2016-07-17 DIAGNOSIS — G459 Transient cerebral ischemic attack, unspecified: Secondary | ICD-10-CM | POA: Diagnosis not present

## 2016-07-17 DIAGNOSIS — R4781 Slurred speech: Secondary | ICD-10-CM | POA: Diagnosis not present

## 2016-07-17 DIAGNOSIS — M6281 Muscle weakness (generalized): Secondary | ICD-10-CM | POA: Diagnosis not present

## 2016-07-17 DIAGNOSIS — S8255XD Nondisplaced fracture of medial malleolus of left tibia, subsequent encounter for closed fracture with routine healing: Secondary | ICD-10-CM | POA: Diagnosis not present

## 2016-07-17 DIAGNOSIS — R279 Unspecified lack of coordination: Secondary | ICD-10-CM | POA: Diagnosis not present

## 2016-07-20 DIAGNOSIS — S8255XD Nondisplaced fracture of medial malleolus of left tibia, subsequent encounter for closed fracture with routine healing: Secondary | ICD-10-CM | POA: Diagnosis not present

## 2016-07-20 DIAGNOSIS — R4781 Slurred speech: Secondary | ICD-10-CM | POA: Diagnosis not present

## 2016-07-20 DIAGNOSIS — R4182 Altered mental status, unspecified: Secondary | ICD-10-CM | POA: Diagnosis not present

## 2016-07-20 DIAGNOSIS — M6281 Muscle weakness (generalized): Secondary | ICD-10-CM | POA: Diagnosis not present

## 2016-07-20 DIAGNOSIS — R279 Unspecified lack of coordination: Secondary | ICD-10-CM | POA: Diagnosis not present

## 2016-07-20 DIAGNOSIS — G459 Transient cerebral ischemic attack, unspecified: Secondary | ICD-10-CM | POA: Diagnosis not present

## 2016-07-21 DIAGNOSIS — M6281 Muscle weakness (generalized): Secondary | ICD-10-CM | POA: Diagnosis not present

## 2016-07-21 DIAGNOSIS — G459 Transient cerebral ischemic attack, unspecified: Secondary | ICD-10-CM | POA: Diagnosis not present

## 2016-07-21 DIAGNOSIS — Z7901 Long term (current) use of anticoagulants: Secondary | ICD-10-CM | POA: Diagnosis not present

## 2016-07-21 DIAGNOSIS — R279 Unspecified lack of coordination: Secondary | ICD-10-CM | POA: Diagnosis not present

## 2016-07-21 DIAGNOSIS — S8255XD Nondisplaced fracture of medial malleolus of left tibia, subsequent encounter for closed fracture with routine healing: Secondary | ICD-10-CM | POA: Diagnosis not present

## 2016-07-21 DIAGNOSIS — R4182 Altered mental status, unspecified: Secondary | ICD-10-CM | POA: Diagnosis not present

## 2016-07-21 DIAGNOSIS — R4781 Slurred speech: Secondary | ICD-10-CM | POA: Diagnosis not present

## 2016-07-22 DIAGNOSIS — G459 Transient cerebral ischemic attack, unspecified: Secondary | ICD-10-CM | POA: Diagnosis not present

## 2016-07-22 DIAGNOSIS — R279 Unspecified lack of coordination: Secondary | ICD-10-CM | POA: Diagnosis not present

## 2016-07-22 DIAGNOSIS — R4781 Slurred speech: Secondary | ICD-10-CM | POA: Diagnosis not present

## 2016-07-22 DIAGNOSIS — Z7901 Long term (current) use of anticoagulants: Secondary | ICD-10-CM | POA: Diagnosis not present

## 2016-07-22 DIAGNOSIS — R4182 Altered mental status, unspecified: Secondary | ICD-10-CM | POA: Diagnosis not present

## 2016-07-22 DIAGNOSIS — M6281 Muscle weakness (generalized): Secondary | ICD-10-CM | POA: Diagnosis not present

## 2016-07-22 DIAGNOSIS — S8255XD Nondisplaced fracture of medial malleolus of left tibia, subsequent encounter for closed fracture with routine healing: Secondary | ICD-10-CM | POA: Diagnosis not present

## 2016-07-23 DIAGNOSIS — M810 Age-related osteoporosis without current pathological fracture: Secondary | ICD-10-CM | POA: Diagnosis not present

## 2016-07-23 DIAGNOSIS — S8255XA Nondisplaced fracture of medial malleolus of left tibia, initial encounter for closed fracture: Secondary | ICD-10-CM | POA: Diagnosis not present

## 2016-07-23 DIAGNOSIS — E119 Type 2 diabetes mellitus without complications: Secondary | ICD-10-CM | POA: Diagnosis not present

## 2016-07-23 DIAGNOSIS — I5042 Chronic combined systolic (congestive) and diastolic (congestive) heart failure: Secondary | ICD-10-CM | POA: Diagnosis not present

## 2016-07-23 DIAGNOSIS — G459 Transient cerebral ischemic attack, unspecified: Secondary | ICD-10-CM | POA: Diagnosis not present

## 2016-07-23 DIAGNOSIS — I482 Chronic atrial fibrillation: Secondary | ICD-10-CM | POA: Diagnosis not present

## 2016-07-23 DIAGNOSIS — R4781 Slurred speech: Secondary | ICD-10-CM | POA: Diagnosis not present

## 2016-07-23 DIAGNOSIS — S8255XD Nondisplaced fracture of medial malleolus of left tibia, subsequent encounter for closed fracture with routine healing: Secondary | ICD-10-CM | POA: Diagnosis not present

## 2016-07-23 DIAGNOSIS — R4182 Altered mental status, unspecified: Secondary | ICD-10-CM | POA: Diagnosis not present

## 2016-07-23 DIAGNOSIS — H8110 Benign paroxysmal vertigo, unspecified ear: Secondary | ICD-10-CM | POA: Diagnosis not present

## 2016-07-23 DIAGNOSIS — R279 Unspecified lack of coordination: Secondary | ICD-10-CM | POA: Diagnosis not present

## 2016-07-23 DIAGNOSIS — R54 Age-related physical debility: Secondary | ICD-10-CM | POA: Diagnosis not present

## 2016-07-23 DIAGNOSIS — M6281 Muscle weakness (generalized): Secondary | ICD-10-CM | POA: Diagnosis not present

## 2016-07-23 DIAGNOSIS — I671 Cerebral aneurysm, nonruptured: Secondary | ICD-10-CM | POA: Diagnosis not present

## 2016-07-23 DIAGNOSIS — R52 Pain, unspecified: Secondary | ICD-10-CM | POA: Diagnosis not present

## 2016-07-24 DIAGNOSIS — R4182 Altered mental status, unspecified: Secondary | ICD-10-CM | POA: Diagnosis not present

## 2016-07-24 DIAGNOSIS — R279 Unspecified lack of coordination: Secondary | ICD-10-CM | POA: Diagnosis not present

## 2016-07-24 DIAGNOSIS — I671 Cerebral aneurysm, nonruptured: Secondary | ICD-10-CM | POA: Diagnosis not present

## 2016-07-24 DIAGNOSIS — Z79899 Other long term (current) drug therapy: Secondary | ICD-10-CM | POA: Diagnosis not present

## 2016-07-24 DIAGNOSIS — Q909 Down syndrome, unspecified: Secondary | ICD-10-CM | POA: Diagnosis not present

## 2016-07-24 DIAGNOSIS — M6281 Muscle weakness (generalized): Secondary | ICD-10-CM | POA: Diagnosis not present

## 2016-07-24 DIAGNOSIS — G459 Transient cerebral ischemic attack, unspecified: Secondary | ICD-10-CM | POA: Diagnosis not present

## 2016-07-24 DIAGNOSIS — R42 Dizziness and giddiness: Secondary | ICD-10-CM | POA: Diagnosis not present

## 2016-07-24 DIAGNOSIS — E119 Type 2 diabetes mellitus without complications: Secondary | ICD-10-CM | POA: Diagnosis not present

## 2016-07-24 DIAGNOSIS — M810 Age-related osteoporosis without current pathological fracture: Secondary | ICD-10-CM | POA: Diagnosis not present

## 2016-07-24 DIAGNOSIS — R4781 Slurred speech: Secondary | ICD-10-CM | POA: Diagnosis not present

## 2016-07-24 DIAGNOSIS — I482 Chronic atrial fibrillation: Secondary | ICD-10-CM | POA: Diagnosis not present

## 2016-07-24 DIAGNOSIS — I10 Essential (primary) hypertension: Secondary | ICD-10-CM | POA: Diagnosis not present

## 2016-07-24 DIAGNOSIS — S8255XD Nondisplaced fracture of medial malleolus of left tibia, subsequent encounter for closed fracture with routine healing: Secondary | ICD-10-CM | POA: Diagnosis not present

## 2016-07-24 DIAGNOSIS — Z7901 Long term (current) use of anticoagulants: Secondary | ICD-10-CM | POA: Diagnosis not present

## 2016-07-24 DIAGNOSIS — I5042 Chronic combined systolic (congestive) and diastolic (congestive) heart failure: Secondary | ICD-10-CM | POA: Diagnosis not present

## 2016-07-27 DIAGNOSIS — M6281 Muscle weakness (generalized): Secondary | ICD-10-CM | POA: Diagnosis not present

## 2016-07-27 DIAGNOSIS — G459 Transient cerebral ischemic attack, unspecified: Secondary | ICD-10-CM | POA: Diagnosis not present

## 2016-07-27 DIAGNOSIS — S8255XD Nondisplaced fracture of medial malleolus of left tibia, subsequent encounter for closed fracture with routine healing: Secondary | ICD-10-CM | POA: Diagnosis not present

## 2016-07-27 DIAGNOSIS — R4781 Slurred speech: Secondary | ICD-10-CM | POA: Diagnosis not present

## 2016-07-27 DIAGNOSIS — R4182 Altered mental status, unspecified: Secondary | ICD-10-CM | POA: Diagnosis not present

## 2016-07-27 DIAGNOSIS — R279 Unspecified lack of coordination: Secondary | ICD-10-CM | POA: Diagnosis not present

## 2016-07-28 DIAGNOSIS — R4781 Slurred speech: Secondary | ICD-10-CM | POA: Diagnosis not present

## 2016-07-28 DIAGNOSIS — R279 Unspecified lack of coordination: Secondary | ICD-10-CM | POA: Diagnosis not present

## 2016-07-28 DIAGNOSIS — R4182 Altered mental status, unspecified: Secondary | ICD-10-CM | POA: Diagnosis not present

## 2016-07-28 DIAGNOSIS — S8255XD Nondisplaced fracture of medial malleolus of left tibia, subsequent encounter for closed fracture with routine healing: Secondary | ICD-10-CM | POA: Diagnosis not present

## 2016-07-28 DIAGNOSIS — M6281 Muscle weakness (generalized): Secondary | ICD-10-CM | POA: Diagnosis not present

## 2016-07-28 DIAGNOSIS — G459 Transient cerebral ischemic attack, unspecified: Secondary | ICD-10-CM | POA: Diagnosis not present

## 2016-07-29 DIAGNOSIS — S8255XD Nondisplaced fracture of medial malleolus of left tibia, subsequent encounter for closed fracture with routine healing: Secondary | ICD-10-CM | POA: Diagnosis not present

## 2016-07-29 DIAGNOSIS — R279 Unspecified lack of coordination: Secondary | ICD-10-CM | POA: Diagnosis not present

## 2016-07-29 DIAGNOSIS — M6281 Muscle weakness (generalized): Secondary | ICD-10-CM | POA: Diagnosis not present

## 2016-07-29 DIAGNOSIS — R4182 Altered mental status, unspecified: Secondary | ICD-10-CM | POA: Diagnosis not present

## 2016-07-29 DIAGNOSIS — G459 Transient cerebral ischemic attack, unspecified: Secondary | ICD-10-CM | POA: Diagnosis not present

## 2016-07-29 DIAGNOSIS — R4781 Slurred speech: Secondary | ICD-10-CM | POA: Diagnosis not present

## 2016-07-30 DIAGNOSIS — S8255XD Nondisplaced fracture of medial malleolus of left tibia, subsequent encounter for closed fracture with routine healing: Secondary | ICD-10-CM | POA: Diagnosis not present

## 2016-07-30 DIAGNOSIS — M6281 Muscle weakness (generalized): Secondary | ICD-10-CM | POA: Diagnosis not present

## 2016-07-30 DIAGNOSIS — R279 Unspecified lack of coordination: Secondary | ICD-10-CM | POA: Diagnosis not present

## 2016-07-30 DIAGNOSIS — R4781 Slurred speech: Secondary | ICD-10-CM | POA: Diagnosis not present

## 2016-07-30 DIAGNOSIS — R4182 Altered mental status, unspecified: Secondary | ICD-10-CM | POA: Diagnosis not present

## 2016-07-30 DIAGNOSIS — G459 Transient cerebral ischemic attack, unspecified: Secondary | ICD-10-CM | POA: Diagnosis not present

## 2016-08-01 DIAGNOSIS — Z5181 Encounter for therapeutic drug level monitoring: Secondary | ICD-10-CM | POA: Diagnosis not present

## 2016-08-01 DIAGNOSIS — Z7901 Long term (current) use of anticoagulants: Secondary | ICD-10-CM | POA: Diagnosis not present

## 2016-08-04 DIAGNOSIS — Z7901 Long term (current) use of anticoagulants: Secondary | ICD-10-CM | POA: Diagnosis not present

## 2016-08-06 DIAGNOSIS — R54 Age-related physical debility: Secondary | ICD-10-CM | POA: Diagnosis not present

## 2016-08-06 DIAGNOSIS — M810 Age-related osteoporosis without current pathological fracture: Secondary | ICD-10-CM | POA: Diagnosis not present

## 2016-08-06 DIAGNOSIS — R52 Pain, unspecified: Secondary | ICD-10-CM | POA: Diagnosis not present

## 2016-08-06 DIAGNOSIS — I482 Chronic atrial fibrillation: Secondary | ICD-10-CM | POA: Diagnosis not present

## 2016-08-06 DIAGNOSIS — E119 Type 2 diabetes mellitus without complications: Secondary | ICD-10-CM | POA: Diagnosis not present

## 2016-08-06 DIAGNOSIS — I5042 Chronic combined systolic (congestive) and diastolic (congestive) heart failure: Secondary | ICD-10-CM | POA: Diagnosis not present

## 2016-08-06 DIAGNOSIS — I671 Cerebral aneurysm, nonruptured: Secondary | ICD-10-CM | POA: Diagnosis not present

## 2016-08-06 DIAGNOSIS — G459 Transient cerebral ischemic attack, unspecified: Secondary | ICD-10-CM | POA: Diagnosis not present

## 2016-08-06 DIAGNOSIS — S8255XA Nondisplaced fracture of medial malleolus of left tibia, initial encounter for closed fracture: Secondary | ICD-10-CM | POA: Diagnosis not present

## 2016-08-06 DIAGNOSIS — H8110 Benign paroxysmal vertigo, unspecified ear: Secondary | ICD-10-CM | POA: Diagnosis not present

## 2016-08-07 DIAGNOSIS — Z79899 Other long term (current) drug therapy: Secondary | ICD-10-CM | POA: Diagnosis not present

## 2016-08-12 DIAGNOSIS — Z5181 Encounter for therapeutic drug level monitoring: Secondary | ICD-10-CM | POA: Diagnosis not present

## 2016-08-12 DIAGNOSIS — Z7901 Long term (current) use of anticoagulants: Secondary | ICD-10-CM | POA: Diagnosis not present

## 2016-08-13 DIAGNOSIS — M6281 Muscle weakness (generalized): Secondary | ICD-10-CM | POA: Diagnosis not present

## 2016-08-20 DIAGNOSIS — Z7901 Long term (current) use of anticoagulants: Secondary | ICD-10-CM | POA: Diagnosis not present

## 2016-08-24 DIAGNOSIS — Z7901 Long term (current) use of anticoagulants: Secondary | ICD-10-CM | POA: Diagnosis not present

## 2016-08-25 DIAGNOSIS — Z7901 Long term (current) use of anticoagulants: Secondary | ICD-10-CM | POA: Diagnosis not present

## 2016-08-27 DIAGNOSIS — Z7901 Long term (current) use of anticoagulants: Secondary | ICD-10-CM | POA: Diagnosis not present

## 2016-08-28 DIAGNOSIS — Z7901 Long term (current) use of anticoagulants: Secondary | ICD-10-CM | POA: Diagnosis not present

## 2016-08-31 DIAGNOSIS — Z7901 Long term (current) use of anticoagulants: Secondary | ICD-10-CM | POA: Diagnosis not present

## 2016-09-01 DIAGNOSIS — Z7901 Long term (current) use of anticoagulants: Secondary | ICD-10-CM | POA: Diagnosis not present

## 2016-09-03 DIAGNOSIS — Z7901 Long term (current) use of anticoagulants: Secondary | ICD-10-CM | POA: Diagnosis not present

## 2016-09-03 DIAGNOSIS — M810 Age-related osteoporosis without current pathological fracture: Secondary | ICD-10-CM | POA: Diagnosis not present

## 2016-09-03 DIAGNOSIS — S8255XA Nondisplaced fracture of medial malleolus of left tibia, initial encounter for closed fracture: Secondary | ICD-10-CM | POA: Diagnosis not present

## 2016-09-03 DIAGNOSIS — H8110 Benign paroxysmal vertigo, unspecified ear: Secondary | ICD-10-CM | POA: Diagnosis not present

## 2016-09-03 DIAGNOSIS — I671 Cerebral aneurysm, nonruptured: Secondary | ICD-10-CM | POA: Diagnosis not present

## 2016-09-03 DIAGNOSIS — R52 Pain, unspecified: Secondary | ICD-10-CM | POA: Diagnosis not present

## 2016-09-03 DIAGNOSIS — R54 Age-related physical debility: Secondary | ICD-10-CM | POA: Diagnosis not present

## 2016-09-03 DIAGNOSIS — G459 Transient cerebral ischemic attack, unspecified: Secondary | ICD-10-CM | POA: Diagnosis not present

## 2016-09-03 DIAGNOSIS — E119 Type 2 diabetes mellitus without complications: Secondary | ICD-10-CM | POA: Diagnosis not present

## 2016-09-03 DIAGNOSIS — I5042 Chronic combined systolic (congestive) and diastolic (congestive) heart failure: Secondary | ICD-10-CM | POA: Diagnosis not present

## 2016-09-03 DIAGNOSIS — I482 Chronic atrial fibrillation: Secondary | ICD-10-CM | POA: Diagnosis not present

## 2016-09-04 DIAGNOSIS — Z79899 Other long term (current) drug therapy: Secondary | ICD-10-CM | POA: Diagnosis not present

## 2016-09-04 DIAGNOSIS — Z7901 Long term (current) use of anticoagulants: Secondary | ICD-10-CM | POA: Diagnosis not present

## 2016-09-07 DIAGNOSIS — Z79899 Other long term (current) drug therapy: Secondary | ICD-10-CM | POA: Diagnosis not present

## 2016-09-08 DIAGNOSIS — Z79899 Other long term (current) drug therapy: Secondary | ICD-10-CM | POA: Diagnosis not present

## 2016-09-09 DIAGNOSIS — Z7901 Long term (current) use of anticoagulants: Secondary | ICD-10-CM | POA: Diagnosis not present

## 2016-09-10 DIAGNOSIS — Z7901 Long term (current) use of anticoagulants: Secondary | ICD-10-CM | POA: Diagnosis not present

## 2016-09-11 DIAGNOSIS — Z7901 Long term (current) use of anticoagulants: Secondary | ICD-10-CM | POA: Diagnosis not present

## 2016-09-14 DIAGNOSIS — Z7901 Long term (current) use of anticoagulants: Secondary | ICD-10-CM | POA: Diagnosis not present

## 2016-09-15 DIAGNOSIS — I4891 Unspecified atrial fibrillation: Secondary | ICD-10-CM | POA: Diagnosis not present

## 2016-09-15 DIAGNOSIS — Z7901 Long term (current) use of anticoagulants: Secondary | ICD-10-CM | POA: Diagnosis not present

## 2016-09-22 DIAGNOSIS — G459 Transient cerebral ischemic attack, unspecified: Secondary | ICD-10-CM | POA: Diagnosis not present

## 2016-09-22 DIAGNOSIS — I5042 Chronic combined systolic (congestive) and diastolic (congestive) heart failure: Secondary | ICD-10-CM | POA: Diagnosis not present

## 2016-09-22 DIAGNOSIS — E559 Vitamin D deficiency, unspecified: Secondary | ICD-10-CM | POA: Diagnosis not present

## 2016-09-22 DIAGNOSIS — I671 Cerebral aneurysm, nonruptured: Secondary | ICD-10-CM | POA: Diagnosis not present

## 2016-09-22 DIAGNOSIS — I272 Pulmonary hypertension, unspecified: Secondary | ICD-10-CM | POA: Diagnosis not present

## 2016-09-22 DIAGNOSIS — E119 Type 2 diabetes mellitus without complications: Secondary | ICD-10-CM | POA: Diagnosis not present

## 2016-09-22 DIAGNOSIS — M6281 Muscle weakness (generalized): Secondary | ICD-10-CM | POA: Diagnosis not present

## 2016-09-22 DIAGNOSIS — Q909 Down syndrome, unspecified: Secondary | ICD-10-CM | POA: Diagnosis not present

## 2016-09-22 DIAGNOSIS — M810 Age-related osteoporosis without current pathological fracture: Secondary | ICD-10-CM | POA: Diagnosis not present

## 2016-10-30 DIAGNOSIS — L603 Nail dystrophy: Secondary | ICD-10-CM | POA: Diagnosis not present

## 2016-10-30 DIAGNOSIS — B351 Tinea unguium: Secondary | ICD-10-CM | POA: Diagnosis not present

## 2016-10-30 DIAGNOSIS — I739 Peripheral vascular disease, unspecified: Secondary | ICD-10-CM | POA: Diagnosis not present

## 2016-10-30 DIAGNOSIS — Q845 Enlarged and hypertrophic nails: Secondary | ICD-10-CM | POA: Diagnosis not present

## 2016-11-05 DIAGNOSIS — R279 Unspecified lack of coordination: Secondary | ICD-10-CM | POA: Diagnosis not present

## 2016-11-05 DIAGNOSIS — M810 Age-related osteoporosis without current pathological fracture: Secondary | ICD-10-CM | POA: Diagnosis not present

## 2016-11-05 DIAGNOSIS — E119 Type 2 diabetes mellitus without complications: Secondary | ICD-10-CM | POA: Diagnosis not present

## 2016-11-05 DIAGNOSIS — I671 Cerebral aneurysm, nonruptured: Secondary | ICD-10-CM | POA: Diagnosis not present

## 2016-11-05 DIAGNOSIS — M6281 Muscle weakness (generalized): Secondary | ICD-10-CM | POA: Diagnosis not present

## 2016-11-05 DIAGNOSIS — R4781 Slurred speech: Secondary | ICD-10-CM | POA: Diagnosis not present

## 2016-11-05 DIAGNOSIS — R4182 Altered mental status, unspecified: Secondary | ICD-10-CM | POA: Diagnosis not present

## 2016-11-05 DIAGNOSIS — R42 Dizziness and giddiness: Secondary | ICD-10-CM | POA: Diagnosis not present

## 2016-11-05 DIAGNOSIS — G459 Transient cerebral ischemic attack, unspecified: Secondary | ICD-10-CM | POA: Diagnosis not present

## 2016-11-05 DIAGNOSIS — I10 Essential (primary) hypertension: Secondary | ICD-10-CM | POA: Diagnosis not present

## 2016-11-05 DIAGNOSIS — I482 Chronic atrial fibrillation: Secondary | ICD-10-CM | POA: Diagnosis not present

## 2016-11-05 DIAGNOSIS — I5042 Chronic combined systolic (congestive) and diastolic (congestive) heart failure: Secondary | ICD-10-CM | POA: Diagnosis not present

## 2016-11-05 DIAGNOSIS — Q909 Down syndrome, unspecified: Secondary | ICD-10-CM | POA: Diagnosis not present

## 2016-11-05 DIAGNOSIS — S8255XD Nondisplaced fracture of medial malleolus of left tibia, subsequent encounter for closed fracture with routine healing: Secondary | ICD-10-CM | POA: Diagnosis not present

## 2016-11-06 DIAGNOSIS — M6281 Muscle weakness (generalized): Secondary | ICD-10-CM | POA: Diagnosis not present

## 2016-11-06 DIAGNOSIS — R4182 Altered mental status, unspecified: Secondary | ICD-10-CM | POA: Diagnosis not present

## 2016-11-06 DIAGNOSIS — R279 Unspecified lack of coordination: Secondary | ICD-10-CM | POA: Diagnosis not present

## 2016-11-06 DIAGNOSIS — N938 Other specified abnormal uterine and vaginal bleeding: Secondary | ICD-10-CM | POA: Diagnosis not present

## 2016-11-06 DIAGNOSIS — R4781 Slurred speech: Secondary | ICD-10-CM | POA: Diagnosis not present

## 2016-11-06 DIAGNOSIS — S8255XD Nondisplaced fracture of medial malleolus of left tibia, subsequent encounter for closed fracture with routine healing: Secondary | ICD-10-CM | POA: Diagnosis not present

## 2016-11-06 DIAGNOSIS — G459 Transient cerebral ischemic attack, unspecified: Secondary | ICD-10-CM | POA: Diagnosis not present

## 2016-11-09 DIAGNOSIS — R4182 Altered mental status, unspecified: Secondary | ICD-10-CM | POA: Diagnosis not present

## 2016-11-09 DIAGNOSIS — N938 Other specified abnormal uterine and vaginal bleeding: Secondary | ICD-10-CM | POA: Diagnosis not present

## 2016-11-09 DIAGNOSIS — G459 Transient cerebral ischemic attack, unspecified: Secondary | ICD-10-CM | POA: Diagnosis not present

## 2016-11-09 DIAGNOSIS — M6281 Muscle weakness (generalized): Secondary | ICD-10-CM | POA: Diagnosis not present

## 2016-11-09 DIAGNOSIS — R4781 Slurred speech: Secondary | ICD-10-CM | POA: Diagnosis not present

## 2016-11-09 DIAGNOSIS — S8255XD Nondisplaced fracture of medial malleolus of left tibia, subsequent encounter for closed fracture with routine healing: Secondary | ICD-10-CM | POA: Diagnosis not present

## 2016-11-09 DIAGNOSIS — R279 Unspecified lack of coordination: Secondary | ICD-10-CM | POA: Diagnosis not present

## 2016-11-10 DIAGNOSIS — R279 Unspecified lack of coordination: Secondary | ICD-10-CM | POA: Diagnosis not present

## 2016-11-10 DIAGNOSIS — M6281 Muscle weakness (generalized): Secondary | ICD-10-CM | POA: Diagnosis not present

## 2016-11-10 DIAGNOSIS — N760 Acute vaginitis: Secondary | ICD-10-CM | POA: Diagnosis not present

## 2016-11-10 DIAGNOSIS — N938 Other specified abnormal uterine and vaginal bleeding: Secondary | ICD-10-CM | POA: Diagnosis not present

## 2016-11-10 DIAGNOSIS — S8255XD Nondisplaced fracture of medial malleolus of left tibia, subsequent encounter for closed fracture with routine healing: Secondary | ICD-10-CM | POA: Diagnosis not present

## 2016-11-10 DIAGNOSIS — R4781 Slurred speech: Secondary | ICD-10-CM | POA: Diagnosis not present

## 2016-11-10 DIAGNOSIS — G459 Transient cerebral ischemic attack, unspecified: Secondary | ICD-10-CM | POA: Diagnosis not present

## 2016-11-10 DIAGNOSIS — R4182 Altered mental status, unspecified: Secondary | ICD-10-CM | POA: Diagnosis not present

## 2016-11-11 DIAGNOSIS — R4781 Slurred speech: Secondary | ICD-10-CM | POA: Diagnosis not present

## 2016-11-11 DIAGNOSIS — S8255XD Nondisplaced fracture of medial malleolus of left tibia, subsequent encounter for closed fracture with routine healing: Secondary | ICD-10-CM | POA: Diagnosis not present

## 2016-11-11 DIAGNOSIS — R4182 Altered mental status, unspecified: Secondary | ICD-10-CM | POA: Diagnosis not present

## 2016-11-11 DIAGNOSIS — R279 Unspecified lack of coordination: Secondary | ICD-10-CM | POA: Diagnosis not present

## 2016-11-11 DIAGNOSIS — M6281 Muscle weakness (generalized): Secondary | ICD-10-CM | POA: Diagnosis not present

## 2016-11-11 DIAGNOSIS — G459 Transient cerebral ischemic attack, unspecified: Secondary | ICD-10-CM | POA: Diagnosis not present

## 2016-11-12 DIAGNOSIS — R4781 Slurred speech: Secondary | ICD-10-CM | POA: Diagnosis not present

## 2016-11-12 DIAGNOSIS — S8255XD Nondisplaced fracture of medial malleolus of left tibia, subsequent encounter for closed fracture with routine healing: Secondary | ICD-10-CM | POA: Diagnosis not present

## 2016-11-12 DIAGNOSIS — N760 Acute vaginitis: Secondary | ICD-10-CM | POA: Diagnosis not present

## 2016-11-12 DIAGNOSIS — N938 Other specified abnormal uterine and vaginal bleeding: Secondary | ICD-10-CM | POA: Diagnosis not present

## 2016-11-12 DIAGNOSIS — R279 Unspecified lack of coordination: Secondary | ICD-10-CM | POA: Diagnosis not present

## 2016-11-12 DIAGNOSIS — I482 Chronic atrial fibrillation: Secondary | ICD-10-CM | POA: Diagnosis not present

## 2016-11-12 DIAGNOSIS — E119 Type 2 diabetes mellitus without complications: Secondary | ICD-10-CM | POA: Diagnosis not present

## 2016-11-12 DIAGNOSIS — G459 Transient cerebral ischemic attack, unspecified: Secondary | ICD-10-CM | POA: Diagnosis not present

## 2016-11-12 DIAGNOSIS — M6281 Muscle weakness (generalized): Secondary | ICD-10-CM | POA: Diagnosis not present

## 2016-11-12 DIAGNOSIS — R4182 Altered mental status, unspecified: Secondary | ICD-10-CM | POA: Diagnosis not present

## 2016-11-13 DIAGNOSIS — S8255XD Nondisplaced fracture of medial malleolus of left tibia, subsequent encounter for closed fracture with routine healing: Secondary | ICD-10-CM | POA: Diagnosis not present

## 2016-11-13 DIAGNOSIS — R279 Unspecified lack of coordination: Secondary | ICD-10-CM | POA: Diagnosis not present

## 2016-11-13 DIAGNOSIS — Z79899 Other long term (current) drug therapy: Secondary | ICD-10-CM | POA: Diagnosis not present

## 2016-11-13 DIAGNOSIS — R4781 Slurred speech: Secondary | ICD-10-CM | POA: Diagnosis not present

## 2016-11-13 DIAGNOSIS — R4182 Altered mental status, unspecified: Secondary | ICD-10-CM | POA: Diagnosis not present

## 2016-11-13 DIAGNOSIS — M6281 Muscle weakness (generalized): Secondary | ICD-10-CM | POA: Diagnosis not present

## 2016-11-13 DIAGNOSIS — G459 Transient cerebral ischemic attack, unspecified: Secondary | ICD-10-CM | POA: Diagnosis not present

## 2016-11-15 DIAGNOSIS — M6281 Muscle weakness (generalized): Secondary | ICD-10-CM | POA: Diagnosis not present

## 2016-11-15 DIAGNOSIS — R4781 Slurred speech: Secondary | ICD-10-CM | POA: Diagnosis not present

## 2016-11-15 DIAGNOSIS — R279 Unspecified lack of coordination: Secondary | ICD-10-CM | POA: Diagnosis not present

## 2016-11-15 DIAGNOSIS — S8255XD Nondisplaced fracture of medial malleolus of left tibia, subsequent encounter for closed fracture with routine healing: Secondary | ICD-10-CM | POA: Diagnosis not present

## 2016-11-15 DIAGNOSIS — G459 Transient cerebral ischemic attack, unspecified: Secondary | ICD-10-CM | POA: Diagnosis not present

## 2016-11-15 DIAGNOSIS — R4182 Altered mental status, unspecified: Secondary | ICD-10-CM | POA: Diagnosis not present

## 2016-11-17 DIAGNOSIS — M6281 Muscle weakness (generalized): Secondary | ICD-10-CM | POA: Diagnosis not present

## 2016-11-17 DIAGNOSIS — Z79899 Other long term (current) drug therapy: Secondary | ICD-10-CM | POA: Diagnosis not present

## 2016-11-17 DIAGNOSIS — I671 Cerebral aneurysm, nonruptured: Secondary | ICD-10-CM | POA: Diagnosis not present

## 2016-11-17 DIAGNOSIS — R52 Pain, unspecified: Secondary | ICD-10-CM | POA: Diagnosis not present

## 2016-11-17 DIAGNOSIS — R4781 Slurred speech: Secondary | ICD-10-CM | POA: Diagnosis not present

## 2016-11-17 DIAGNOSIS — R279 Unspecified lack of coordination: Secondary | ICD-10-CM | POA: Diagnosis not present

## 2016-11-17 DIAGNOSIS — I5042 Chronic combined systolic (congestive) and diastolic (congestive) heart failure: Secondary | ICD-10-CM | POA: Diagnosis not present

## 2016-11-17 DIAGNOSIS — H8110 Benign paroxysmal vertigo, unspecified ear: Secondary | ICD-10-CM | POA: Diagnosis not present

## 2016-11-17 DIAGNOSIS — M810 Age-related osteoporosis without current pathological fracture: Secondary | ICD-10-CM | POA: Diagnosis not present

## 2016-11-17 DIAGNOSIS — S8255XD Nondisplaced fracture of medial malleolus of left tibia, subsequent encounter for closed fracture with routine healing: Secondary | ICD-10-CM | POA: Diagnosis not present

## 2016-11-17 DIAGNOSIS — G459 Transient cerebral ischemic attack, unspecified: Secondary | ICD-10-CM | POA: Diagnosis not present

## 2016-11-17 DIAGNOSIS — R4182 Altered mental status, unspecified: Secondary | ICD-10-CM | POA: Diagnosis not present

## 2016-11-17 DIAGNOSIS — R54 Age-related physical debility: Secondary | ICD-10-CM | POA: Diagnosis not present

## 2016-11-17 DIAGNOSIS — S8255XA Nondisplaced fracture of medial malleolus of left tibia, initial encounter for closed fracture: Secondary | ICD-10-CM | POA: Diagnosis not present

## 2016-11-17 DIAGNOSIS — E559 Vitamin D deficiency, unspecified: Secondary | ICD-10-CM | POA: Diagnosis not present

## 2016-11-17 DIAGNOSIS — I482 Chronic atrial fibrillation: Secondary | ICD-10-CM | POA: Diagnosis not present

## 2016-11-17 DIAGNOSIS — E119 Type 2 diabetes mellitus without complications: Secondary | ICD-10-CM | POA: Diagnosis not present

## 2016-11-18 DIAGNOSIS — S8255XD Nondisplaced fracture of medial malleolus of left tibia, subsequent encounter for closed fracture with routine healing: Secondary | ICD-10-CM | POA: Diagnosis not present

## 2016-11-18 DIAGNOSIS — R4781 Slurred speech: Secondary | ICD-10-CM | POA: Diagnosis not present

## 2016-11-18 DIAGNOSIS — I517 Cardiomegaly: Secondary | ICD-10-CM | POA: Diagnosis not present

## 2016-11-18 DIAGNOSIS — R279 Unspecified lack of coordination: Secondary | ICD-10-CM | POA: Diagnosis not present

## 2016-11-18 DIAGNOSIS — R05 Cough: Secondary | ICD-10-CM | POA: Diagnosis not present

## 2016-11-18 DIAGNOSIS — R4182 Altered mental status, unspecified: Secondary | ICD-10-CM | POA: Diagnosis not present

## 2016-11-18 DIAGNOSIS — G459 Transient cerebral ischemic attack, unspecified: Secondary | ICD-10-CM | POA: Diagnosis not present

## 2016-11-18 DIAGNOSIS — M6281 Muscle weakness (generalized): Secondary | ICD-10-CM | POA: Diagnosis not present

## 2016-11-19 DIAGNOSIS — S8255XD Nondisplaced fracture of medial malleolus of left tibia, subsequent encounter for closed fracture with routine healing: Secondary | ICD-10-CM | POA: Diagnosis not present

## 2016-11-19 DIAGNOSIS — G459 Transient cerebral ischemic attack, unspecified: Secondary | ICD-10-CM | POA: Diagnosis not present

## 2016-11-19 DIAGNOSIS — M6281 Muscle weakness (generalized): Secondary | ICD-10-CM | POA: Diagnosis not present

## 2016-11-19 DIAGNOSIS — R4182 Altered mental status, unspecified: Secondary | ICD-10-CM | POA: Diagnosis not present

## 2016-11-19 DIAGNOSIS — R279 Unspecified lack of coordination: Secondary | ICD-10-CM | POA: Diagnosis not present

## 2016-11-19 DIAGNOSIS — R4781 Slurred speech: Secondary | ICD-10-CM | POA: Diagnosis not present

## 2016-11-20 DIAGNOSIS — R4182 Altered mental status, unspecified: Secondary | ICD-10-CM | POA: Diagnosis not present

## 2016-11-20 DIAGNOSIS — S8255XD Nondisplaced fracture of medial malleolus of left tibia, subsequent encounter for closed fracture with routine healing: Secondary | ICD-10-CM | POA: Diagnosis not present

## 2016-11-20 DIAGNOSIS — M6281 Muscle weakness (generalized): Secondary | ICD-10-CM | POA: Diagnosis not present

## 2016-11-20 DIAGNOSIS — G459 Transient cerebral ischemic attack, unspecified: Secondary | ICD-10-CM | POA: Diagnosis not present

## 2016-11-20 DIAGNOSIS — R4781 Slurred speech: Secondary | ICD-10-CM | POA: Diagnosis not present

## 2016-11-20 DIAGNOSIS — R279 Unspecified lack of coordination: Secondary | ICD-10-CM | POA: Diagnosis not present

## 2016-11-21 DIAGNOSIS — G459 Transient cerebral ischemic attack, unspecified: Secondary | ICD-10-CM | POA: Diagnosis not present

## 2016-11-21 DIAGNOSIS — I4891 Unspecified atrial fibrillation: Secondary | ICD-10-CM | POA: Diagnosis not present

## 2016-11-21 DIAGNOSIS — Q909 Down syndrome, unspecified: Secondary | ICD-10-CM | POA: Diagnosis not present

## 2016-11-21 DIAGNOSIS — E559 Vitamin D deficiency, unspecified: Secondary | ICD-10-CM | POA: Diagnosis not present

## 2016-11-21 DIAGNOSIS — I5042 Chronic combined systolic (congestive) and diastolic (congestive) heart failure: Secondary | ICD-10-CM | POA: Diagnosis not present

## 2016-11-21 DIAGNOSIS — M6281 Muscle weakness (generalized): Secondary | ICD-10-CM | POA: Diagnosis not present

## 2016-11-21 DIAGNOSIS — I671 Cerebral aneurysm, nonruptured: Secondary | ICD-10-CM | POA: Diagnosis not present

## 2016-11-21 DIAGNOSIS — E119 Type 2 diabetes mellitus without complications: Secondary | ICD-10-CM | POA: Diagnosis not present

## 2016-11-21 DIAGNOSIS — I272 Pulmonary hypertension, unspecified: Secondary | ICD-10-CM | POA: Diagnosis not present

## 2016-11-21 DIAGNOSIS — M810 Age-related osteoporosis without current pathological fracture: Secondary | ICD-10-CM | POA: Diagnosis not present

## 2016-11-21 DIAGNOSIS — I482 Chronic atrial fibrillation: Secondary | ICD-10-CM | POA: Diagnosis not present

## 2016-11-23 DIAGNOSIS — M6281 Muscle weakness (generalized): Secondary | ICD-10-CM | POA: Diagnosis not present

## 2016-11-23 DIAGNOSIS — I5042 Chronic combined systolic (congestive) and diastolic (congestive) heart failure: Secondary | ICD-10-CM | POA: Diagnosis not present

## 2016-11-23 DIAGNOSIS — R4781 Slurred speech: Secondary | ICD-10-CM | POA: Diagnosis not present

## 2016-11-23 DIAGNOSIS — I10 Essential (primary) hypertension: Secondary | ICD-10-CM | POA: Diagnosis not present

## 2016-11-23 DIAGNOSIS — R262 Difficulty in walking, not elsewhere classified: Secondary | ICD-10-CM | POA: Diagnosis not present

## 2016-11-23 DIAGNOSIS — I671 Cerebral aneurysm, nonruptured: Secondary | ICD-10-CM | POA: Diagnosis not present

## 2016-11-23 DIAGNOSIS — E119 Type 2 diabetes mellitus without complications: Secondary | ICD-10-CM | POA: Diagnosis not present

## 2016-11-23 DIAGNOSIS — I482 Chronic atrial fibrillation: Secondary | ICD-10-CM | POA: Diagnosis not present

## 2016-11-23 DIAGNOSIS — R279 Unspecified lack of coordination: Secondary | ICD-10-CM | POA: Diagnosis not present

## 2016-11-23 DIAGNOSIS — S8255XD Nondisplaced fracture of medial malleolus of left tibia, subsequent encounter for closed fracture with routine healing: Secondary | ICD-10-CM | POA: Diagnosis not present

## 2016-11-23 DIAGNOSIS — R4182 Altered mental status, unspecified: Secondary | ICD-10-CM | POA: Diagnosis not present

## 2016-11-23 DIAGNOSIS — Q909 Down syndrome, unspecified: Secondary | ICD-10-CM | POA: Diagnosis not present

## 2016-11-23 DIAGNOSIS — G459 Transient cerebral ischemic attack, unspecified: Secondary | ICD-10-CM | POA: Diagnosis not present

## 2016-11-23 DIAGNOSIS — R42 Dizziness and giddiness: Secondary | ICD-10-CM | POA: Diagnosis not present

## 2016-11-23 DIAGNOSIS — M810 Age-related osteoporosis without current pathological fracture: Secondary | ICD-10-CM | POA: Diagnosis not present

## 2016-11-24 DIAGNOSIS — S8255XA Nondisplaced fracture of medial malleolus of left tibia, initial encounter for closed fracture: Secondary | ICD-10-CM | POA: Diagnosis not present

## 2016-11-24 DIAGNOSIS — H8110 Benign paroxysmal vertigo, unspecified ear: Secondary | ICD-10-CM | POA: Diagnosis not present

## 2016-11-24 DIAGNOSIS — I671 Cerebral aneurysm, nonruptured: Secondary | ICD-10-CM | POA: Diagnosis not present

## 2016-11-24 DIAGNOSIS — R4182 Altered mental status, unspecified: Secondary | ICD-10-CM | POA: Diagnosis not present

## 2016-11-24 DIAGNOSIS — I5042 Chronic combined systolic (congestive) and diastolic (congestive) heart failure: Secondary | ICD-10-CM | POA: Diagnosis not present

## 2016-11-24 DIAGNOSIS — R52 Pain, unspecified: Secondary | ICD-10-CM | POA: Diagnosis not present

## 2016-11-24 DIAGNOSIS — I10 Essential (primary) hypertension: Secondary | ICD-10-CM | POA: Diagnosis not present

## 2016-11-24 DIAGNOSIS — E119 Type 2 diabetes mellitus without complications: Secondary | ICD-10-CM | POA: Diagnosis not present

## 2016-11-24 DIAGNOSIS — R54 Age-related physical debility: Secondary | ICD-10-CM | POA: Diagnosis not present

## 2016-11-24 DIAGNOSIS — R279 Unspecified lack of coordination: Secondary | ICD-10-CM | POA: Diagnosis not present

## 2016-11-24 DIAGNOSIS — E559 Vitamin D deficiency, unspecified: Secondary | ICD-10-CM | POA: Diagnosis not present

## 2016-11-24 DIAGNOSIS — M6281 Muscle weakness (generalized): Secondary | ICD-10-CM | POA: Diagnosis not present

## 2016-11-24 DIAGNOSIS — M810 Age-related osteoporosis without current pathological fracture: Secondary | ICD-10-CM | POA: Diagnosis not present

## 2016-11-24 DIAGNOSIS — I482 Chronic atrial fibrillation: Secondary | ICD-10-CM | POA: Diagnosis not present

## 2016-11-24 DIAGNOSIS — S8255XD Nondisplaced fracture of medial malleolus of left tibia, subsequent encounter for closed fracture with routine healing: Secondary | ICD-10-CM | POA: Diagnosis not present

## 2016-11-24 DIAGNOSIS — R262 Difficulty in walking, not elsewhere classified: Secondary | ICD-10-CM | POA: Diagnosis not present

## 2016-11-24 DIAGNOSIS — G459 Transient cerebral ischemic attack, unspecified: Secondary | ICD-10-CM | POA: Diagnosis not present

## 2016-11-25 DIAGNOSIS — M6281 Muscle weakness (generalized): Secondary | ICD-10-CM | POA: Diagnosis not present

## 2016-11-25 DIAGNOSIS — R262 Difficulty in walking, not elsewhere classified: Secondary | ICD-10-CM | POA: Diagnosis not present

## 2016-11-25 DIAGNOSIS — R4182 Altered mental status, unspecified: Secondary | ICD-10-CM | POA: Diagnosis not present

## 2016-11-25 DIAGNOSIS — S8255XD Nondisplaced fracture of medial malleolus of left tibia, subsequent encounter for closed fracture with routine healing: Secondary | ICD-10-CM | POA: Diagnosis not present

## 2016-11-25 DIAGNOSIS — R279 Unspecified lack of coordination: Secondary | ICD-10-CM | POA: Diagnosis not present

## 2016-11-25 DIAGNOSIS — G459 Transient cerebral ischemic attack, unspecified: Secondary | ICD-10-CM | POA: Diagnosis not present

## 2016-11-26 DIAGNOSIS — S8255XD Nondisplaced fracture of medial malleolus of left tibia, subsequent encounter for closed fracture with routine healing: Secondary | ICD-10-CM | POA: Diagnosis not present

## 2016-11-26 DIAGNOSIS — R262 Difficulty in walking, not elsewhere classified: Secondary | ICD-10-CM | POA: Diagnosis not present

## 2016-11-26 DIAGNOSIS — R4182 Altered mental status, unspecified: Secondary | ICD-10-CM | POA: Diagnosis not present

## 2016-11-26 DIAGNOSIS — M6281 Muscle weakness (generalized): Secondary | ICD-10-CM | POA: Diagnosis not present

## 2016-11-26 DIAGNOSIS — G459 Transient cerebral ischemic attack, unspecified: Secondary | ICD-10-CM | POA: Diagnosis not present

## 2016-11-26 DIAGNOSIS — R279 Unspecified lack of coordination: Secondary | ICD-10-CM | POA: Diagnosis not present

## 2016-11-27 DIAGNOSIS — R262 Difficulty in walking, not elsewhere classified: Secondary | ICD-10-CM | POA: Diagnosis not present

## 2016-11-27 DIAGNOSIS — R279 Unspecified lack of coordination: Secondary | ICD-10-CM | POA: Diagnosis not present

## 2016-11-27 DIAGNOSIS — R4182 Altered mental status, unspecified: Secondary | ICD-10-CM | POA: Diagnosis not present

## 2016-11-27 DIAGNOSIS — M6281 Muscle weakness (generalized): Secondary | ICD-10-CM | POA: Diagnosis not present

## 2016-11-27 DIAGNOSIS — G459 Transient cerebral ischemic attack, unspecified: Secondary | ICD-10-CM | POA: Diagnosis not present

## 2016-11-27 DIAGNOSIS — S8255XD Nondisplaced fracture of medial malleolus of left tibia, subsequent encounter for closed fracture with routine healing: Secondary | ICD-10-CM | POA: Diagnosis not present

## 2016-11-30 DIAGNOSIS — R262 Difficulty in walking, not elsewhere classified: Secondary | ICD-10-CM | POA: Diagnosis not present

## 2016-11-30 DIAGNOSIS — S8255XD Nondisplaced fracture of medial malleolus of left tibia, subsequent encounter for closed fracture with routine healing: Secondary | ICD-10-CM | POA: Diagnosis not present

## 2016-11-30 DIAGNOSIS — G459 Transient cerebral ischemic attack, unspecified: Secondary | ICD-10-CM | POA: Diagnosis not present

## 2016-11-30 DIAGNOSIS — M6281 Muscle weakness (generalized): Secondary | ICD-10-CM | POA: Diagnosis not present

## 2016-11-30 DIAGNOSIS — R279 Unspecified lack of coordination: Secondary | ICD-10-CM | POA: Diagnosis not present

## 2016-11-30 DIAGNOSIS — R4182 Altered mental status, unspecified: Secondary | ICD-10-CM | POA: Diagnosis not present

## 2016-12-01 DIAGNOSIS — G459 Transient cerebral ischemic attack, unspecified: Secondary | ICD-10-CM | POA: Diagnosis not present

## 2016-12-01 DIAGNOSIS — R4182 Altered mental status, unspecified: Secondary | ICD-10-CM | POA: Diagnosis not present

## 2016-12-01 DIAGNOSIS — M6281 Muscle weakness (generalized): Secondary | ICD-10-CM | POA: Diagnosis not present

## 2016-12-01 DIAGNOSIS — S8255XD Nondisplaced fracture of medial malleolus of left tibia, subsequent encounter for closed fracture with routine healing: Secondary | ICD-10-CM | POA: Diagnosis not present

## 2016-12-01 DIAGNOSIS — R262 Difficulty in walking, not elsewhere classified: Secondary | ICD-10-CM | POA: Diagnosis not present

## 2016-12-01 DIAGNOSIS — R279 Unspecified lack of coordination: Secondary | ICD-10-CM | POA: Diagnosis not present

## 2016-12-02 DIAGNOSIS — S8255XD Nondisplaced fracture of medial malleolus of left tibia, subsequent encounter for closed fracture with routine healing: Secondary | ICD-10-CM | POA: Diagnosis not present

## 2016-12-02 DIAGNOSIS — M6281 Muscle weakness (generalized): Secondary | ICD-10-CM | POA: Diagnosis not present

## 2016-12-02 DIAGNOSIS — G459 Transient cerebral ischemic attack, unspecified: Secondary | ICD-10-CM | POA: Diagnosis not present

## 2016-12-02 DIAGNOSIS — R4182 Altered mental status, unspecified: Secondary | ICD-10-CM | POA: Diagnosis not present

## 2016-12-02 DIAGNOSIS — R279 Unspecified lack of coordination: Secondary | ICD-10-CM | POA: Diagnosis not present

## 2016-12-02 DIAGNOSIS — R262 Difficulty in walking, not elsewhere classified: Secondary | ICD-10-CM | POA: Diagnosis not present

## 2016-12-03 DIAGNOSIS — S8255XD Nondisplaced fracture of medial malleolus of left tibia, subsequent encounter for closed fracture with routine healing: Secondary | ICD-10-CM | POA: Diagnosis not present

## 2016-12-03 DIAGNOSIS — R4182 Altered mental status, unspecified: Secondary | ICD-10-CM | POA: Diagnosis not present

## 2016-12-03 DIAGNOSIS — R279 Unspecified lack of coordination: Secondary | ICD-10-CM | POA: Diagnosis not present

## 2016-12-03 DIAGNOSIS — M6281 Muscle weakness (generalized): Secondary | ICD-10-CM | POA: Diagnosis not present

## 2016-12-03 DIAGNOSIS — R262 Difficulty in walking, not elsewhere classified: Secondary | ICD-10-CM | POA: Diagnosis not present

## 2016-12-03 DIAGNOSIS — G459 Transient cerebral ischemic attack, unspecified: Secondary | ICD-10-CM | POA: Diagnosis not present

## 2016-12-04 DIAGNOSIS — R262 Difficulty in walking, not elsewhere classified: Secondary | ICD-10-CM | POA: Diagnosis not present

## 2016-12-04 DIAGNOSIS — R4182 Altered mental status, unspecified: Secondary | ICD-10-CM | POA: Diagnosis not present

## 2016-12-04 DIAGNOSIS — S8255XD Nondisplaced fracture of medial malleolus of left tibia, subsequent encounter for closed fracture with routine healing: Secondary | ICD-10-CM | POA: Diagnosis not present

## 2016-12-04 DIAGNOSIS — G459 Transient cerebral ischemic attack, unspecified: Secondary | ICD-10-CM | POA: Diagnosis not present

## 2016-12-04 DIAGNOSIS — M6281 Muscle weakness (generalized): Secondary | ICD-10-CM | POA: Diagnosis not present

## 2016-12-04 DIAGNOSIS — R279 Unspecified lack of coordination: Secondary | ICD-10-CM | POA: Diagnosis not present

## 2016-12-07 DIAGNOSIS — S8255XD Nondisplaced fracture of medial malleolus of left tibia, subsequent encounter for closed fracture with routine healing: Secondary | ICD-10-CM | POA: Diagnosis not present

## 2016-12-07 DIAGNOSIS — M6281 Muscle weakness (generalized): Secondary | ICD-10-CM | POA: Diagnosis not present

## 2016-12-07 DIAGNOSIS — R279 Unspecified lack of coordination: Secondary | ICD-10-CM | POA: Diagnosis not present

## 2016-12-07 DIAGNOSIS — R262 Difficulty in walking, not elsewhere classified: Secondary | ICD-10-CM | POA: Diagnosis not present

## 2016-12-07 DIAGNOSIS — G459 Transient cerebral ischemic attack, unspecified: Secondary | ICD-10-CM | POA: Diagnosis not present

## 2016-12-07 DIAGNOSIS — R4182 Altered mental status, unspecified: Secondary | ICD-10-CM | POA: Diagnosis not present

## 2016-12-08 DIAGNOSIS — S8255XD Nondisplaced fracture of medial malleolus of left tibia, subsequent encounter for closed fracture with routine healing: Secondary | ICD-10-CM | POA: Diagnosis not present

## 2016-12-08 DIAGNOSIS — R4182 Altered mental status, unspecified: Secondary | ICD-10-CM | POA: Diagnosis not present

## 2016-12-08 DIAGNOSIS — R279 Unspecified lack of coordination: Secondary | ICD-10-CM | POA: Diagnosis not present

## 2016-12-08 DIAGNOSIS — R262 Difficulty in walking, not elsewhere classified: Secondary | ICD-10-CM | POA: Diagnosis not present

## 2016-12-08 DIAGNOSIS — M6281 Muscle weakness (generalized): Secondary | ICD-10-CM | POA: Diagnosis not present

## 2016-12-08 DIAGNOSIS — G459 Transient cerebral ischemic attack, unspecified: Secondary | ICD-10-CM | POA: Diagnosis not present

## 2016-12-09 DIAGNOSIS — G459 Transient cerebral ischemic attack, unspecified: Secondary | ICD-10-CM | POA: Diagnosis not present

## 2016-12-09 DIAGNOSIS — M6281 Muscle weakness (generalized): Secondary | ICD-10-CM | POA: Diagnosis not present

## 2016-12-09 DIAGNOSIS — R279 Unspecified lack of coordination: Secondary | ICD-10-CM | POA: Diagnosis not present

## 2016-12-09 DIAGNOSIS — S8255XD Nondisplaced fracture of medial malleolus of left tibia, subsequent encounter for closed fracture with routine healing: Secondary | ICD-10-CM | POA: Diagnosis not present

## 2016-12-09 DIAGNOSIS — R4182 Altered mental status, unspecified: Secondary | ICD-10-CM | POA: Diagnosis not present

## 2016-12-09 DIAGNOSIS — R262 Difficulty in walking, not elsewhere classified: Secondary | ICD-10-CM | POA: Diagnosis not present

## 2016-12-10 DIAGNOSIS — S8255XD Nondisplaced fracture of medial malleolus of left tibia, subsequent encounter for closed fracture with routine healing: Secondary | ICD-10-CM | POA: Diagnosis not present

## 2016-12-10 DIAGNOSIS — R279 Unspecified lack of coordination: Secondary | ICD-10-CM | POA: Diagnosis not present

## 2016-12-10 DIAGNOSIS — M6281 Muscle weakness (generalized): Secondary | ICD-10-CM | POA: Diagnosis not present

## 2016-12-10 DIAGNOSIS — R262 Difficulty in walking, not elsewhere classified: Secondary | ICD-10-CM | POA: Diagnosis not present

## 2016-12-10 DIAGNOSIS — G459 Transient cerebral ischemic attack, unspecified: Secondary | ICD-10-CM | POA: Diagnosis not present

## 2016-12-10 DIAGNOSIS — R4182 Altered mental status, unspecified: Secondary | ICD-10-CM | POA: Diagnosis not present

## 2016-12-11 DIAGNOSIS — S8255XD Nondisplaced fracture of medial malleolus of left tibia, subsequent encounter for closed fracture with routine healing: Secondary | ICD-10-CM | POA: Diagnosis not present

## 2016-12-11 DIAGNOSIS — G459 Transient cerebral ischemic attack, unspecified: Secondary | ICD-10-CM | POA: Diagnosis not present

## 2016-12-11 DIAGNOSIS — M6281 Muscle weakness (generalized): Secondary | ICD-10-CM | POA: Diagnosis not present

## 2016-12-11 DIAGNOSIS — R4182 Altered mental status, unspecified: Secondary | ICD-10-CM | POA: Diagnosis not present

## 2016-12-11 DIAGNOSIS — R279 Unspecified lack of coordination: Secondary | ICD-10-CM | POA: Diagnosis not present

## 2016-12-11 DIAGNOSIS — R262 Difficulty in walking, not elsewhere classified: Secondary | ICD-10-CM | POA: Diagnosis not present

## 2016-12-12 DIAGNOSIS — M6281 Muscle weakness (generalized): Secondary | ICD-10-CM | POA: Diagnosis not present

## 2016-12-12 DIAGNOSIS — R262 Difficulty in walking, not elsewhere classified: Secondary | ICD-10-CM | POA: Diagnosis not present

## 2016-12-12 DIAGNOSIS — S8255XD Nondisplaced fracture of medial malleolus of left tibia, subsequent encounter for closed fracture with routine healing: Secondary | ICD-10-CM | POA: Diagnosis not present

## 2016-12-12 DIAGNOSIS — R4182 Altered mental status, unspecified: Secondary | ICD-10-CM | POA: Diagnosis not present

## 2016-12-12 DIAGNOSIS — G459 Transient cerebral ischemic attack, unspecified: Secondary | ICD-10-CM | POA: Diagnosis not present

## 2016-12-12 DIAGNOSIS — R279 Unspecified lack of coordination: Secondary | ICD-10-CM | POA: Diagnosis not present

## 2016-12-14 DIAGNOSIS — M6281 Muscle weakness (generalized): Secondary | ICD-10-CM | POA: Diagnosis not present

## 2016-12-14 DIAGNOSIS — R279 Unspecified lack of coordination: Secondary | ICD-10-CM | POA: Diagnosis not present

## 2016-12-14 DIAGNOSIS — R4182 Altered mental status, unspecified: Secondary | ICD-10-CM | POA: Diagnosis not present

## 2016-12-14 DIAGNOSIS — G459 Transient cerebral ischemic attack, unspecified: Secondary | ICD-10-CM | POA: Diagnosis not present

## 2016-12-14 DIAGNOSIS — S8255XD Nondisplaced fracture of medial malleolus of left tibia, subsequent encounter for closed fracture with routine healing: Secondary | ICD-10-CM | POA: Diagnosis not present

## 2016-12-14 DIAGNOSIS — R262 Difficulty in walking, not elsewhere classified: Secondary | ICD-10-CM | POA: Diagnosis not present

## 2016-12-15 DIAGNOSIS — M6281 Muscle weakness (generalized): Secondary | ICD-10-CM | POA: Diagnosis not present

## 2016-12-15 DIAGNOSIS — R279 Unspecified lack of coordination: Secondary | ICD-10-CM | POA: Diagnosis not present

## 2016-12-15 DIAGNOSIS — G459 Transient cerebral ischemic attack, unspecified: Secondary | ICD-10-CM | POA: Diagnosis not present

## 2016-12-15 DIAGNOSIS — R4182 Altered mental status, unspecified: Secondary | ICD-10-CM | POA: Diagnosis not present

## 2016-12-15 DIAGNOSIS — R262 Difficulty in walking, not elsewhere classified: Secondary | ICD-10-CM | POA: Diagnosis not present

## 2016-12-15 DIAGNOSIS — S8255XD Nondisplaced fracture of medial malleolus of left tibia, subsequent encounter for closed fracture with routine healing: Secondary | ICD-10-CM | POA: Diagnosis not present

## 2016-12-16 DIAGNOSIS — M6281 Muscle weakness (generalized): Secondary | ICD-10-CM | POA: Diagnosis not present

## 2016-12-16 DIAGNOSIS — R262 Difficulty in walking, not elsewhere classified: Secondary | ICD-10-CM | POA: Diagnosis not present

## 2016-12-16 DIAGNOSIS — S8255XD Nondisplaced fracture of medial malleolus of left tibia, subsequent encounter for closed fracture with routine healing: Secondary | ICD-10-CM | POA: Diagnosis not present

## 2016-12-16 DIAGNOSIS — G459 Transient cerebral ischemic attack, unspecified: Secondary | ICD-10-CM | POA: Diagnosis not present

## 2016-12-16 DIAGNOSIS — R4182 Altered mental status, unspecified: Secondary | ICD-10-CM | POA: Diagnosis not present

## 2016-12-16 DIAGNOSIS — R279 Unspecified lack of coordination: Secondary | ICD-10-CM | POA: Diagnosis not present

## 2016-12-17 DIAGNOSIS — R262 Difficulty in walking, not elsewhere classified: Secondary | ICD-10-CM | POA: Diagnosis not present

## 2016-12-17 DIAGNOSIS — G459 Transient cerebral ischemic attack, unspecified: Secondary | ICD-10-CM | POA: Diagnosis not present

## 2016-12-17 DIAGNOSIS — R4182 Altered mental status, unspecified: Secondary | ICD-10-CM | POA: Diagnosis not present

## 2016-12-17 DIAGNOSIS — M6281 Muscle weakness (generalized): Secondary | ICD-10-CM | POA: Diagnosis not present

## 2016-12-17 DIAGNOSIS — S8255XD Nondisplaced fracture of medial malleolus of left tibia, subsequent encounter for closed fracture with routine healing: Secondary | ICD-10-CM | POA: Diagnosis not present

## 2016-12-17 DIAGNOSIS — R279 Unspecified lack of coordination: Secondary | ICD-10-CM | POA: Diagnosis not present

## 2016-12-18 DIAGNOSIS — R4182 Altered mental status, unspecified: Secondary | ICD-10-CM | POA: Diagnosis not present

## 2016-12-18 DIAGNOSIS — G459 Transient cerebral ischemic attack, unspecified: Secondary | ICD-10-CM | POA: Diagnosis not present

## 2016-12-18 DIAGNOSIS — R262 Difficulty in walking, not elsewhere classified: Secondary | ICD-10-CM | POA: Diagnosis not present

## 2016-12-18 DIAGNOSIS — M6281 Muscle weakness (generalized): Secondary | ICD-10-CM | POA: Diagnosis not present

## 2016-12-18 DIAGNOSIS — R279 Unspecified lack of coordination: Secondary | ICD-10-CM | POA: Diagnosis not present

## 2016-12-18 DIAGNOSIS — S8255XD Nondisplaced fracture of medial malleolus of left tibia, subsequent encounter for closed fracture with routine healing: Secondary | ICD-10-CM | POA: Diagnosis not present

## 2016-12-21 DIAGNOSIS — S8255XD Nondisplaced fracture of medial malleolus of left tibia, subsequent encounter for closed fracture with routine healing: Secondary | ICD-10-CM | POA: Diagnosis not present

## 2016-12-21 DIAGNOSIS — R262 Difficulty in walking, not elsewhere classified: Secondary | ICD-10-CM | POA: Diagnosis not present

## 2016-12-21 DIAGNOSIS — I482 Chronic atrial fibrillation: Secondary | ICD-10-CM | POA: Diagnosis not present

## 2016-12-21 DIAGNOSIS — M810 Age-related osteoporosis without current pathological fracture: Secondary | ICD-10-CM | POA: Diagnosis not present

## 2016-12-21 DIAGNOSIS — I272 Pulmonary hypertension, unspecified: Secondary | ICD-10-CM | POA: Diagnosis not present

## 2016-12-21 DIAGNOSIS — I4891 Unspecified atrial fibrillation: Secondary | ICD-10-CM | POA: Diagnosis not present

## 2016-12-21 DIAGNOSIS — G459 Transient cerebral ischemic attack, unspecified: Secondary | ICD-10-CM | POA: Diagnosis not present

## 2016-12-21 DIAGNOSIS — M6281 Muscle weakness (generalized): Secondary | ICD-10-CM | POA: Diagnosis not present

## 2016-12-21 DIAGNOSIS — R279 Unspecified lack of coordination: Secondary | ICD-10-CM | POA: Diagnosis not present

## 2016-12-21 DIAGNOSIS — R4182 Altered mental status, unspecified: Secondary | ICD-10-CM | POA: Diagnosis not present

## 2016-12-21 DIAGNOSIS — I5042 Chronic combined systolic (congestive) and diastolic (congestive) heart failure: Secondary | ICD-10-CM | POA: Diagnosis not present

## 2016-12-21 DIAGNOSIS — I671 Cerebral aneurysm, nonruptured: Secondary | ICD-10-CM | POA: Diagnosis not present

## 2016-12-21 DIAGNOSIS — E559 Vitamin D deficiency, unspecified: Secondary | ICD-10-CM | POA: Diagnosis not present

## 2016-12-21 DIAGNOSIS — E119 Type 2 diabetes mellitus without complications: Secondary | ICD-10-CM | POA: Diagnosis not present

## 2016-12-21 DIAGNOSIS — Q909 Down syndrome, unspecified: Secondary | ICD-10-CM | POA: Diagnosis not present

## 2016-12-22 DIAGNOSIS — G459 Transient cerebral ischemic attack, unspecified: Secondary | ICD-10-CM | POA: Diagnosis not present

## 2016-12-22 DIAGNOSIS — R279 Unspecified lack of coordination: Secondary | ICD-10-CM | POA: Diagnosis not present

## 2016-12-22 DIAGNOSIS — S8255XD Nondisplaced fracture of medial malleolus of left tibia, subsequent encounter for closed fracture with routine healing: Secondary | ICD-10-CM | POA: Diagnosis not present

## 2016-12-22 DIAGNOSIS — R4182 Altered mental status, unspecified: Secondary | ICD-10-CM | POA: Diagnosis not present

## 2016-12-22 DIAGNOSIS — M6281 Muscle weakness (generalized): Secondary | ICD-10-CM | POA: Diagnosis not present

## 2016-12-22 DIAGNOSIS — R262 Difficulty in walking, not elsewhere classified: Secondary | ICD-10-CM | POA: Diagnosis not present

## 2016-12-23 DIAGNOSIS — R262 Difficulty in walking, not elsewhere classified: Secondary | ICD-10-CM | POA: Diagnosis not present

## 2016-12-23 DIAGNOSIS — R279 Unspecified lack of coordination: Secondary | ICD-10-CM | POA: Diagnosis not present

## 2016-12-23 DIAGNOSIS — M6281 Muscle weakness (generalized): Secondary | ICD-10-CM | POA: Diagnosis not present

## 2016-12-23 DIAGNOSIS — S8255XD Nondisplaced fracture of medial malleolus of left tibia, subsequent encounter for closed fracture with routine healing: Secondary | ICD-10-CM | POA: Diagnosis not present

## 2016-12-23 DIAGNOSIS — G459 Transient cerebral ischemic attack, unspecified: Secondary | ICD-10-CM | POA: Diagnosis not present

## 2016-12-23 DIAGNOSIS — R4182 Altered mental status, unspecified: Secondary | ICD-10-CM | POA: Diagnosis not present

## 2016-12-25 DIAGNOSIS — Z79899 Other long term (current) drug therapy: Secondary | ICD-10-CM | POA: Diagnosis not present

## 2016-12-30 DIAGNOSIS — G459 Transient cerebral ischemic attack, unspecified: Secondary | ICD-10-CM | POA: Diagnosis not present

## 2016-12-30 DIAGNOSIS — R52 Pain, unspecified: Secondary | ICD-10-CM | POA: Diagnosis not present

## 2016-12-30 DIAGNOSIS — R54 Age-related physical debility: Secondary | ICD-10-CM | POA: Diagnosis not present

## 2016-12-30 DIAGNOSIS — E559 Vitamin D deficiency, unspecified: Secondary | ICD-10-CM | POA: Diagnosis not present

## 2016-12-30 DIAGNOSIS — S8255XA Nondisplaced fracture of medial malleolus of left tibia, initial encounter for closed fracture: Secondary | ICD-10-CM | POA: Diagnosis not present

## 2016-12-30 DIAGNOSIS — H8110 Benign paroxysmal vertigo, unspecified ear: Secondary | ICD-10-CM | POA: Diagnosis not present

## 2016-12-30 DIAGNOSIS — M6281 Muscle weakness (generalized): Secondary | ICD-10-CM | POA: Diagnosis not present

## 2016-12-30 DIAGNOSIS — M810 Age-related osteoporosis without current pathological fracture: Secondary | ICD-10-CM | POA: Diagnosis not present

## 2016-12-30 DIAGNOSIS — E119 Type 2 diabetes mellitus without complications: Secondary | ICD-10-CM | POA: Diagnosis not present

## 2016-12-30 DIAGNOSIS — I671 Cerebral aneurysm, nonruptured: Secondary | ICD-10-CM | POA: Diagnosis not present

## 2016-12-30 DIAGNOSIS — I482 Chronic atrial fibrillation: Secondary | ICD-10-CM | POA: Diagnosis not present

## 2016-12-30 DIAGNOSIS — I5042 Chronic combined systolic (congestive) and diastolic (congestive) heart failure: Secondary | ICD-10-CM | POA: Diagnosis not present

## 2017-01-13 DIAGNOSIS — I5042 Chronic combined systolic (congestive) and diastolic (congestive) heart failure: Secondary | ICD-10-CM | POA: Diagnosis not present

## 2017-01-14 DIAGNOSIS — Z79899 Other long term (current) drug therapy: Secondary | ICD-10-CM | POA: Diagnosis not present

## 2017-01-15 DIAGNOSIS — Q845 Enlarged and hypertrophic nails: Secondary | ICD-10-CM | POA: Diagnosis not present

## 2017-01-15 DIAGNOSIS — L603 Nail dystrophy: Secondary | ICD-10-CM | POA: Diagnosis not present

## 2017-01-15 DIAGNOSIS — B351 Tinea unguium: Secondary | ICD-10-CM | POA: Diagnosis not present

## 2017-01-15 DIAGNOSIS — I739 Peripheral vascular disease, unspecified: Secondary | ICD-10-CM | POA: Diagnosis not present

## 2017-01-19 DIAGNOSIS — I482 Chronic atrial fibrillation: Secondary | ICD-10-CM | POA: Diagnosis not present

## 2017-01-19 DIAGNOSIS — E119 Type 2 diabetes mellitus without complications: Secondary | ICD-10-CM | POA: Diagnosis not present

## 2017-01-19 DIAGNOSIS — I5042 Chronic combined systolic (congestive) and diastolic (congestive) heart failure: Secondary | ICD-10-CM | POA: Diagnosis not present

## 2017-01-19 DIAGNOSIS — I671 Cerebral aneurysm, nonruptured: Secondary | ICD-10-CM | POA: Diagnosis not present

## 2017-01-19 DIAGNOSIS — I4891 Unspecified atrial fibrillation: Secondary | ICD-10-CM | POA: Diagnosis not present

## 2017-01-19 DIAGNOSIS — G459 Transient cerebral ischemic attack, unspecified: Secondary | ICD-10-CM | POA: Diagnosis not present

## 2017-01-19 DIAGNOSIS — I2721 Secondary pulmonary arterial hypertension: Secondary | ICD-10-CM | POA: Diagnosis not present

## 2017-01-19 DIAGNOSIS — Q909 Down syndrome, unspecified: Secondary | ICD-10-CM | POA: Diagnosis not present

## 2017-01-19 DIAGNOSIS — E559 Vitamin D deficiency, unspecified: Secondary | ICD-10-CM | POA: Diagnosis not present

## 2017-01-19 DIAGNOSIS — M810 Age-related osteoporosis without current pathological fracture: Secondary | ICD-10-CM | POA: Diagnosis not present

## 2017-01-19 DIAGNOSIS — M6281 Muscle weakness (generalized): Secondary | ICD-10-CM | POA: Diagnosis not present

## 2017-01-29 DIAGNOSIS — R52 Pain, unspecified: Secondary | ICD-10-CM | POA: Diagnosis not present

## 2017-01-29 DIAGNOSIS — I10 Essential (primary) hypertension: Secondary | ICD-10-CM | POA: Diagnosis not present

## 2017-01-29 DIAGNOSIS — R54 Age-related physical debility: Secondary | ICD-10-CM | POA: Diagnosis not present

## 2017-01-29 DIAGNOSIS — M810 Age-related osteoporosis without current pathological fracture: Secondary | ICD-10-CM | POA: Diagnosis not present

## 2017-01-29 DIAGNOSIS — I482 Chronic atrial fibrillation: Secondary | ICD-10-CM | POA: Diagnosis not present

## 2017-01-29 DIAGNOSIS — I671 Cerebral aneurysm, nonruptured: Secondary | ICD-10-CM | POA: Diagnosis not present

## 2017-01-29 DIAGNOSIS — I5042 Chronic combined systolic (congestive) and diastolic (congestive) heart failure: Secondary | ICD-10-CM | POA: Diagnosis not present

## 2017-01-29 DIAGNOSIS — E119 Type 2 diabetes mellitus without complications: Secondary | ICD-10-CM | POA: Diagnosis not present

## 2017-01-29 DIAGNOSIS — S8255XA Nondisplaced fracture of medial malleolus of left tibia, initial encounter for closed fracture: Secondary | ICD-10-CM | POA: Diagnosis not present

## 2017-02-16 DIAGNOSIS — I5042 Chronic combined systolic (congestive) and diastolic (congestive) heart failure: Secondary | ICD-10-CM | POA: Diagnosis not present

## 2017-02-16 DIAGNOSIS — E119 Type 2 diabetes mellitus without complications: Secondary | ICD-10-CM | POA: Diagnosis not present

## 2017-02-16 DIAGNOSIS — G459 Transient cerebral ischemic attack, unspecified: Secondary | ICD-10-CM | POA: Diagnosis not present

## 2017-02-16 DIAGNOSIS — Q909 Down syndrome, unspecified: Secondary | ICD-10-CM | POA: Diagnosis not present

## 2017-02-16 DIAGNOSIS — I482 Chronic atrial fibrillation: Secondary | ICD-10-CM | POA: Diagnosis not present

## 2017-02-16 DIAGNOSIS — M6281 Muscle weakness (generalized): Secondary | ICD-10-CM | POA: Diagnosis not present

## 2017-02-16 DIAGNOSIS — I671 Cerebral aneurysm, nonruptured: Secondary | ICD-10-CM | POA: Diagnosis not present

## 2017-02-16 DIAGNOSIS — E559 Vitamin D deficiency, unspecified: Secondary | ICD-10-CM | POA: Diagnosis not present

## 2017-02-16 DIAGNOSIS — I4891 Unspecified atrial fibrillation: Secondary | ICD-10-CM | POA: Diagnosis not present

## 2017-02-16 DIAGNOSIS — I272 Pulmonary hypertension, unspecified: Secondary | ICD-10-CM | POA: Diagnosis not present

## 2017-02-16 DIAGNOSIS — M810 Age-related osteoporosis without current pathological fracture: Secondary | ICD-10-CM | POA: Diagnosis not present

## 2017-02-17 DIAGNOSIS — I10 Essential (primary) hypertension: Secondary | ICD-10-CM | POA: Diagnosis not present

## 2017-02-17 DIAGNOSIS — R52 Pain, unspecified: Secondary | ICD-10-CM | POA: Diagnosis not present

## 2017-02-17 DIAGNOSIS — I5042 Chronic combined systolic (congestive) and diastolic (congestive) heart failure: Secondary | ICD-10-CM | POA: Diagnosis not present

## 2017-02-17 DIAGNOSIS — R54 Age-related physical debility: Secondary | ICD-10-CM | POA: Diagnosis not present

## 2017-02-17 DIAGNOSIS — R634 Abnormal weight loss: Secondary | ICD-10-CM | POA: Diagnosis not present

## 2017-02-17 DIAGNOSIS — H8110 Benign paroxysmal vertigo, unspecified ear: Secondary | ICD-10-CM | POA: Diagnosis not present

## 2017-02-17 DIAGNOSIS — I671 Cerebral aneurysm, nonruptured: Secondary | ICD-10-CM | POA: Diagnosis not present

## 2017-02-17 DIAGNOSIS — M810 Age-related osteoporosis without current pathological fracture: Secondary | ICD-10-CM | POA: Diagnosis not present

## 2017-02-17 DIAGNOSIS — S8255XA Nondisplaced fracture of medial malleolus of left tibia, initial encounter for closed fracture: Secondary | ICD-10-CM | POA: Diagnosis not present

## 2017-02-17 DIAGNOSIS — G459 Transient cerebral ischemic attack, unspecified: Secondary | ICD-10-CM | POA: Diagnosis not present

## 2017-02-17 DIAGNOSIS — E119 Type 2 diabetes mellitus without complications: Secondary | ICD-10-CM | POA: Diagnosis not present

## 2017-02-17 DIAGNOSIS — I482 Chronic atrial fibrillation: Secondary | ICD-10-CM | POA: Diagnosis not present

## 2017-02-18 DIAGNOSIS — Z79899 Other long term (current) drug therapy: Secondary | ICD-10-CM | POA: Diagnosis not present

## 2017-03-11 DIAGNOSIS — I5042 Chronic combined systolic (congestive) and diastolic (congestive) heart failure: Secondary | ICD-10-CM | POA: Diagnosis not present

## 2017-03-11 DIAGNOSIS — I10 Essential (primary) hypertension: Secondary | ICD-10-CM | POA: Diagnosis not present

## 2017-03-11 DIAGNOSIS — M810 Age-related osteoporosis without current pathological fracture: Secondary | ICD-10-CM | POA: Diagnosis not present

## 2017-03-11 DIAGNOSIS — E559 Vitamin D deficiency, unspecified: Secondary | ICD-10-CM | POA: Diagnosis not present

## 2017-03-11 DIAGNOSIS — H8110 Benign paroxysmal vertigo, unspecified ear: Secondary | ICD-10-CM | POA: Diagnosis not present

## 2017-03-11 DIAGNOSIS — S8255XA Nondisplaced fracture of medial malleolus of left tibia, initial encounter for closed fracture: Secondary | ICD-10-CM | POA: Diagnosis not present

## 2017-03-11 DIAGNOSIS — I671 Cerebral aneurysm, nonruptured: Secondary | ICD-10-CM | POA: Diagnosis not present

## 2017-03-11 DIAGNOSIS — E119 Type 2 diabetes mellitus without complications: Secondary | ICD-10-CM | POA: Diagnosis not present

## 2017-03-11 DIAGNOSIS — R634 Abnormal weight loss: Secondary | ICD-10-CM | POA: Diagnosis not present

## 2017-03-11 DIAGNOSIS — G459 Transient cerebral ischemic attack, unspecified: Secondary | ICD-10-CM | POA: Diagnosis not present

## 2017-03-11 DIAGNOSIS — I482 Chronic atrial fibrillation: Secondary | ICD-10-CM | POA: Diagnosis not present

## 2017-03-11 DIAGNOSIS — R54 Age-related physical debility: Secondary | ICD-10-CM | POA: Diagnosis not present

## 2017-03-12 DIAGNOSIS — Z79899 Other long term (current) drug therapy: Secondary | ICD-10-CM | POA: Diagnosis not present

## 2017-03-12 DIAGNOSIS — Z7901 Long term (current) use of anticoagulants: Secondary | ICD-10-CM | POA: Diagnosis not present

## 2017-03-12 DIAGNOSIS — D529 Folate deficiency anemia, unspecified: Secondary | ICD-10-CM | POA: Diagnosis not present

## 2017-03-12 DIAGNOSIS — D508 Other iron deficiency anemias: Secondary | ICD-10-CM | POA: Diagnosis not present

## 2017-03-12 DIAGNOSIS — I110 Hypertensive heart disease with heart failure: Secondary | ICD-10-CM | POA: Diagnosis not present

## 2017-03-16 DIAGNOSIS — D696 Thrombocytopenia, unspecified: Secondary | ICD-10-CM | POA: Diagnosis not present

## 2017-03-16 DIAGNOSIS — M810 Age-related osteoporosis without current pathological fracture: Secondary | ICD-10-CM | POA: Diagnosis not present

## 2017-03-16 DIAGNOSIS — I4891 Unspecified atrial fibrillation: Secondary | ICD-10-CM | POA: Diagnosis not present

## 2017-03-16 DIAGNOSIS — E559 Vitamin D deficiency, unspecified: Secondary | ICD-10-CM | POA: Diagnosis not present

## 2017-03-16 DIAGNOSIS — I5042 Chronic combined systolic (congestive) and diastolic (congestive) heart failure: Secondary | ICD-10-CM | POA: Diagnosis not present

## 2017-03-16 DIAGNOSIS — I10 Essential (primary) hypertension: Secondary | ICD-10-CM | POA: Diagnosis not present

## 2017-03-17 DIAGNOSIS — M6281 Muscle weakness (generalized): Secondary | ICD-10-CM | POA: Diagnosis not present

## 2017-03-17 DIAGNOSIS — I671 Cerebral aneurysm, nonruptured: Secondary | ICD-10-CM | POA: Diagnosis not present

## 2017-03-17 DIAGNOSIS — Q909 Down syndrome, unspecified: Secondary | ICD-10-CM | POA: Diagnosis not present

## 2017-03-17 DIAGNOSIS — I272 Pulmonary hypertension, unspecified: Secondary | ICD-10-CM | POA: Diagnosis not present

## 2017-03-17 DIAGNOSIS — I4891 Unspecified atrial fibrillation: Secondary | ICD-10-CM | POA: Diagnosis not present

## 2017-03-17 DIAGNOSIS — I5042 Chronic combined systolic (congestive) and diastolic (congestive) heart failure: Secondary | ICD-10-CM | POA: Diagnosis not present

## 2017-03-17 DIAGNOSIS — E119 Type 2 diabetes mellitus without complications: Secondary | ICD-10-CM | POA: Diagnosis not present

## 2017-03-17 DIAGNOSIS — E559 Vitamin D deficiency, unspecified: Secondary | ICD-10-CM | POA: Diagnosis not present

## 2017-03-17 DIAGNOSIS — I482 Chronic atrial fibrillation: Secondary | ICD-10-CM | POA: Diagnosis not present

## 2017-03-17 DIAGNOSIS — M810 Age-related osteoporosis without current pathological fracture: Secondary | ICD-10-CM | POA: Diagnosis not present

## 2017-03-17 DIAGNOSIS — G459 Transient cerebral ischemic attack, unspecified: Secondary | ICD-10-CM | POA: Diagnosis not present

## 2017-03-19 DIAGNOSIS — L603 Nail dystrophy: Secondary | ICD-10-CM | POA: Diagnosis not present

## 2017-03-19 DIAGNOSIS — B351 Tinea unguium: Secondary | ICD-10-CM | POA: Diagnosis not present

## 2017-03-19 DIAGNOSIS — Q845 Enlarged and hypertrophic nails: Secondary | ICD-10-CM | POA: Diagnosis not present

## 2017-03-19 DIAGNOSIS — I739 Peripheral vascular disease, unspecified: Secondary | ICD-10-CM | POA: Diagnosis not present

## 2017-03-26 DIAGNOSIS — R634 Abnormal weight loss: Secondary | ICD-10-CM | POA: Diagnosis not present

## 2017-03-26 DIAGNOSIS — G459 Transient cerebral ischemic attack, unspecified: Secondary | ICD-10-CM | POA: Diagnosis not present

## 2017-03-26 DIAGNOSIS — M810 Age-related osteoporosis without current pathological fracture: Secondary | ICD-10-CM | POA: Diagnosis not present

## 2017-03-26 DIAGNOSIS — I482 Chronic atrial fibrillation: Secondary | ICD-10-CM | POA: Diagnosis not present

## 2017-03-26 DIAGNOSIS — H8110 Benign paroxysmal vertigo, unspecified ear: Secondary | ICD-10-CM | POA: Diagnosis not present

## 2017-03-26 DIAGNOSIS — E559 Vitamin D deficiency, unspecified: Secondary | ICD-10-CM | POA: Diagnosis not present

## 2017-03-26 DIAGNOSIS — I671 Cerebral aneurysm, nonruptured: Secondary | ICD-10-CM | POA: Diagnosis not present

## 2017-03-26 DIAGNOSIS — S8255XA Nondisplaced fracture of medial malleolus of left tibia, initial encounter for closed fracture: Secondary | ICD-10-CM | POA: Diagnosis not present

## 2017-03-26 DIAGNOSIS — E119 Type 2 diabetes mellitus without complications: Secondary | ICD-10-CM | POA: Diagnosis not present

## 2017-03-26 DIAGNOSIS — I10 Essential (primary) hypertension: Secondary | ICD-10-CM | POA: Diagnosis not present

## 2017-03-26 DIAGNOSIS — I5042 Chronic combined systolic (congestive) and diastolic (congestive) heart failure: Secondary | ICD-10-CM | POA: Diagnosis not present

## 2017-03-26 DIAGNOSIS — R54 Age-related physical debility: Secondary | ICD-10-CM | POA: Diagnosis not present

## 2017-04-13 DIAGNOSIS — I10 Essential (primary) hypertension: Secondary | ICD-10-CM | POA: Diagnosis not present

## 2017-04-13 DIAGNOSIS — M810 Age-related osteoporosis without current pathological fracture: Secondary | ICD-10-CM | POA: Diagnosis not present

## 2017-04-13 DIAGNOSIS — B379 Candidiasis, unspecified: Secondary | ICD-10-CM | POA: Diagnosis not present

## 2017-04-13 DIAGNOSIS — I482 Chronic atrial fibrillation: Secondary | ICD-10-CM | POA: Diagnosis not present

## 2017-04-13 DIAGNOSIS — E119 Type 2 diabetes mellitus without complications: Secondary | ICD-10-CM | POA: Diagnosis not present

## 2017-04-13 DIAGNOSIS — I5042 Chronic combined systolic (congestive) and diastolic (congestive) heart failure: Secondary | ICD-10-CM | POA: Diagnosis not present

## 2017-04-13 DIAGNOSIS — Z79899 Other long term (current) drug therapy: Secondary | ICD-10-CM | POA: Diagnosis not present

## 2017-04-13 DIAGNOSIS — I671 Cerebral aneurysm, nonruptured: Secondary | ICD-10-CM | POA: Diagnosis not present

## 2017-04-15 DIAGNOSIS — Z79899 Other long term (current) drug therapy: Secondary | ICD-10-CM | POA: Diagnosis not present

## 2017-04-19 DIAGNOSIS — G459 Transient cerebral ischemic attack, unspecified: Secondary | ICD-10-CM | POA: Diagnosis not present

## 2017-04-19 DIAGNOSIS — E119 Type 2 diabetes mellitus without complications: Secondary | ICD-10-CM | POA: Diagnosis not present

## 2017-04-19 DIAGNOSIS — Q909 Down syndrome, unspecified: Secondary | ICD-10-CM | POA: Diagnosis not present

## 2017-04-19 DIAGNOSIS — I5042 Chronic combined systolic (congestive) and diastolic (congestive) heart failure: Secondary | ICD-10-CM | POA: Diagnosis not present

## 2017-04-19 DIAGNOSIS — M810 Age-related osteoporosis without current pathological fracture: Secondary | ICD-10-CM | POA: Diagnosis not present

## 2017-04-19 DIAGNOSIS — I272 Pulmonary hypertension, unspecified: Secondary | ICD-10-CM | POA: Diagnosis not present

## 2017-04-19 DIAGNOSIS — M6281 Muscle weakness (generalized): Secondary | ICD-10-CM | POA: Diagnosis not present

## 2017-04-19 DIAGNOSIS — I482 Chronic atrial fibrillation: Secondary | ICD-10-CM | POA: Diagnosis not present

## 2017-04-19 DIAGNOSIS — E559 Vitamin D deficiency, unspecified: Secondary | ICD-10-CM | POA: Diagnosis not present

## 2017-04-19 DIAGNOSIS — I671 Cerebral aneurysm, nonruptured: Secondary | ICD-10-CM | POA: Diagnosis not present

## 2017-04-19 DIAGNOSIS — I4891 Unspecified atrial fibrillation: Secondary | ICD-10-CM | POA: Diagnosis not present

## 2017-05-07 DIAGNOSIS — E119 Type 2 diabetes mellitus without complications: Secondary | ICD-10-CM | POA: Diagnosis not present

## 2017-05-07 DIAGNOSIS — I482 Chronic atrial fibrillation: Secondary | ICD-10-CM | POA: Diagnosis not present

## 2017-05-07 DIAGNOSIS — I5042 Chronic combined systolic (congestive) and diastolic (congestive) heart failure: Secondary | ICD-10-CM | POA: Diagnosis not present

## 2017-05-07 DIAGNOSIS — I10 Essential (primary) hypertension: Secondary | ICD-10-CM | POA: Diagnosis not present

## 2017-05-10 DIAGNOSIS — K219 Gastro-esophageal reflux disease without esophagitis: Secondary | ICD-10-CM | POA: Diagnosis present

## 2017-05-10 DIAGNOSIS — R262 Difficulty in walking, not elsewhere classified: Secondary | ICD-10-CM | POA: Diagnosis not present

## 2017-05-10 DIAGNOSIS — I482 Chronic atrial fibrillation: Secondary | ICD-10-CM | POA: Diagnosis present

## 2017-05-10 DIAGNOSIS — Q909 Down syndrome, unspecified: Secondary | ICD-10-CM | POA: Diagnosis not present

## 2017-05-10 DIAGNOSIS — Z9889 Other specified postprocedural states: Secondary | ICD-10-CM | POA: Diagnosis not present

## 2017-05-10 DIAGNOSIS — I5042 Chronic combined systolic (congestive) and diastolic (congestive) heart failure: Secondary | ICD-10-CM | POA: Diagnosis not present

## 2017-05-10 DIAGNOSIS — R279 Unspecified lack of coordination: Secondary | ICD-10-CM | POA: Diagnosis not present

## 2017-05-10 DIAGNOSIS — Z7189 Other specified counseling: Secondary | ICD-10-CM | POA: Diagnosis not present

## 2017-05-10 DIAGNOSIS — Z9189 Other specified personal risk factors, not elsewhere classified: Secondary | ICD-10-CM | POA: Diagnosis not present

## 2017-05-10 DIAGNOSIS — H9190 Unspecified hearing loss, unspecified ear: Secondary | ICD-10-CM | POA: Diagnosis present

## 2017-05-10 DIAGNOSIS — I270 Primary pulmonary hypertension: Secondary | ICD-10-CM | POA: Diagnosis not present

## 2017-05-10 DIAGNOSIS — I071 Rheumatic tricuspid insufficiency: Secondary | ICD-10-CM | POA: Diagnosis present

## 2017-05-10 DIAGNOSIS — I4891 Unspecified atrial fibrillation: Secondary | ICD-10-CM | POA: Diagnosis not present

## 2017-05-10 DIAGNOSIS — R0600 Dyspnea, unspecified: Secondary | ICD-10-CM | POA: Diagnosis not present

## 2017-05-10 DIAGNOSIS — J9601 Acute respiratory failure with hypoxia: Secondary | ICD-10-CM | POA: Diagnosis not present

## 2017-05-10 DIAGNOSIS — I420 Dilated cardiomyopathy: Secondary | ICD-10-CM | POA: Diagnosis not present

## 2017-05-10 DIAGNOSIS — I313 Pericardial effusion (noninflammatory): Secondary | ICD-10-CM | POA: Diagnosis present

## 2017-05-10 DIAGNOSIS — R0789 Other chest pain: Secondary | ICD-10-CM | POA: Diagnosis not present

## 2017-05-10 DIAGNOSIS — E119 Type 2 diabetes mellitus without complications: Secondary | ICD-10-CM | POA: Diagnosis present

## 2017-05-10 DIAGNOSIS — J189 Pneumonia, unspecified organism: Secondary | ICD-10-CM | POA: Diagnosis not present

## 2017-05-10 DIAGNOSIS — Z8673 Personal history of transient ischemic attack (TIA), and cerebral infarction without residual deficits: Secondary | ICD-10-CM | POA: Diagnosis not present

## 2017-05-10 DIAGNOSIS — M810 Age-related osteoporosis without current pathological fracture: Secondary | ICD-10-CM | POA: Diagnosis not present

## 2017-05-10 DIAGNOSIS — K5900 Constipation, unspecified: Secondary | ICD-10-CM | POA: Diagnosis present

## 2017-05-10 DIAGNOSIS — Z23 Encounter for immunization: Secondary | ICD-10-CM | POA: Diagnosis not present

## 2017-05-10 DIAGNOSIS — Z801 Family history of malignant neoplasm of trachea, bronchus and lung: Secondary | ICD-10-CM | POA: Diagnosis not present

## 2017-05-10 DIAGNOSIS — J9811 Atelectasis: Secondary | ICD-10-CM | POA: Diagnosis not present

## 2017-05-10 DIAGNOSIS — S8255XA Nondisplaced fracture of medial malleolus of left tibia, initial encounter for closed fracture: Secondary | ICD-10-CM | POA: Diagnosis not present

## 2017-05-10 DIAGNOSIS — R0602 Shortness of breath: Secondary | ICD-10-CM | POA: Diagnosis not present

## 2017-05-10 DIAGNOSIS — G459 Transient cerebral ischemic attack, unspecified: Secondary | ICD-10-CM | POA: Diagnosis not present

## 2017-05-10 DIAGNOSIS — I110 Hypertensive heart disease with heart failure: Secondary | ICD-10-CM | POA: Diagnosis present

## 2017-05-10 DIAGNOSIS — R4189 Other symptoms and signs involving cognitive functions and awareness: Secondary | ICD-10-CM | POA: Diagnosis not present

## 2017-05-10 DIAGNOSIS — Z7901 Long term (current) use of anticoagulants: Secondary | ICD-10-CM | POA: Diagnosis not present

## 2017-05-10 DIAGNOSIS — I272 Pulmonary hypertension, unspecified: Secondary | ICD-10-CM | POA: Diagnosis present

## 2017-05-10 DIAGNOSIS — E559 Vitamin D deficiency, unspecified: Secondary | ICD-10-CM | POA: Diagnosis present

## 2017-05-10 DIAGNOSIS — I5023 Acute on chronic systolic (congestive) heart failure: Secondary | ICD-10-CM | POA: Diagnosis not present

## 2017-05-10 DIAGNOSIS — R2689 Other abnormalities of gait and mobility: Secondary | ICD-10-CM | POA: Diagnosis not present

## 2017-05-10 DIAGNOSIS — I509 Heart failure, unspecified: Secondary | ICD-10-CM | POA: Diagnosis not present

## 2017-05-10 DIAGNOSIS — I10 Essential (primary) hypertension: Secondary | ICD-10-CM | POA: Diagnosis not present

## 2017-05-10 DIAGNOSIS — R54 Age-related physical debility: Secondary | ICD-10-CM | POA: Diagnosis not present

## 2017-05-10 DIAGNOSIS — M6281 Muscle weakness (generalized): Secondary | ICD-10-CM | POA: Diagnosis not present

## 2017-05-10 DIAGNOSIS — R32 Unspecified urinary incontinence: Secondary | ICD-10-CM | POA: Diagnosis present

## 2017-05-10 DIAGNOSIS — I671 Cerebral aneurysm, nonruptured: Secondary | ICD-10-CM | POA: Diagnosis not present

## 2017-05-10 DIAGNOSIS — J90 Pleural effusion, not elsewhere classified: Secondary | ICD-10-CM | POA: Diagnosis not present

## 2017-05-14 DIAGNOSIS — R9431 Abnormal electrocardiogram [ECG] [EKG]: Secondary | ICD-10-CM | POA: Diagnosis not present

## 2017-05-14 DIAGNOSIS — B351 Tinea unguium: Secondary | ICD-10-CM | POA: Diagnosis not present

## 2017-05-14 DIAGNOSIS — D6949 Other primary thrombocytopenia: Secondary | ICD-10-CM | POA: Diagnosis not present

## 2017-05-14 DIAGNOSIS — R0602 Shortness of breath: Secondary | ICD-10-CM | POA: Diagnosis not present

## 2017-05-14 DIAGNOSIS — F09 Unspecified mental disorder due to known physiological condition: Secondary | ICD-10-CM | POA: Diagnosis present

## 2017-05-14 DIAGNOSIS — I5042 Chronic combined systolic (congestive) and diastolic (congestive) heart failure: Secondary | ICD-10-CM | POA: Diagnosis not present

## 2017-05-14 DIAGNOSIS — L819 Disorder of pigmentation, unspecified: Secondary | ICD-10-CM | POA: Diagnosis not present

## 2017-05-14 DIAGNOSIS — R262 Difficulty in walking, not elsewhere classified: Secondary | ICD-10-CM | POA: Diagnosis not present

## 2017-05-14 DIAGNOSIS — I272 Pulmonary hypertension, unspecified: Secondary | ICD-10-CM | POA: Diagnosis present

## 2017-05-14 DIAGNOSIS — R54 Age-related physical debility: Secondary | ICD-10-CM | POA: Diagnosis not present

## 2017-05-14 DIAGNOSIS — M6281 Muscle weakness (generalized): Secondary | ICD-10-CM | POA: Diagnosis not present

## 2017-05-14 DIAGNOSIS — S90422A Blister (nonthermal), left great toe, initial encounter: Secondary | ICD-10-CM | POA: Diagnosis not present

## 2017-05-14 DIAGNOSIS — J9811 Atelectasis: Secondary | ICD-10-CM | POA: Diagnosis present

## 2017-05-14 DIAGNOSIS — Q909 Down syndrome, unspecified: Secondary | ICD-10-CM | POA: Diagnosis not present

## 2017-05-14 DIAGNOSIS — E119 Type 2 diabetes mellitus without complications: Secondary | ICD-10-CM | POA: Diagnosis present

## 2017-05-14 DIAGNOSIS — I110 Hypertensive heart disease with heart failure: Secondary | ICD-10-CM | POA: Diagnosis present

## 2017-05-14 DIAGNOSIS — I429 Cardiomyopathy, unspecified: Secondary | ICD-10-CM | POA: Diagnosis present

## 2017-05-14 DIAGNOSIS — S90829D Blister (nonthermal), unspecified foot, subsequent encounter: Secondary | ICD-10-CM | POA: Diagnosis not present

## 2017-05-14 DIAGNOSIS — R918 Other nonspecific abnormal finding of lung field: Secondary | ICD-10-CM | POA: Diagnosis not present

## 2017-05-14 DIAGNOSIS — G4700 Insomnia, unspecified: Secondary | ICD-10-CM | POA: Diagnosis present

## 2017-05-14 DIAGNOSIS — J9600 Acute respiratory failure, unspecified whether with hypoxia or hypercapnia: Secondary | ICD-10-CM | POA: Diagnosis not present

## 2017-05-14 DIAGNOSIS — Z8673 Personal history of transient ischemic attack (TIA), and cerebral infarction without residual deficits: Secondary | ICD-10-CM | POA: Diagnosis not present

## 2017-05-14 DIAGNOSIS — I671 Cerebral aneurysm, nonruptured: Secondary | ICD-10-CM | POA: Diagnosis not present

## 2017-05-14 DIAGNOSIS — Z7901 Long term (current) use of anticoagulants: Secondary | ICD-10-CM | POA: Diagnosis not present

## 2017-05-14 DIAGNOSIS — I482 Chronic atrial fibrillation: Secondary | ICD-10-CM | POA: Diagnosis present

## 2017-05-14 DIAGNOSIS — R0789 Other chest pain: Secondary | ICD-10-CM | POA: Diagnosis not present

## 2017-05-14 DIAGNOSIS — M79672 Pain in left foot: Secondary | ICD-10-CM | POA: Diagnosis not present

## 2017-05-14 DIAGNOSIS — Q845 Enlarged and hypertrophic nails: Secondary | ICD-10-CM | POA: Diagnosis not present

## 2017-05-14 DIAGNOSIS — R0600 Dyspnea, unspecified: Secondary | ICD-10-CM | POA: Diagnosis not present

## 2017-05-14 DIAGNOSIS — S8255XA Nondisplaced fracture of medial malleolus of left tibia, initial encounter for closed fracture: Secondary | ICD-10-CM | POA: Diagnosis not present

## 2017-05-14 DIAGNOSIS — R069 Unspecified abnormalities of breathing: Secondary | ICD-10-CM | POA: Diagnosis not present

## 2017-05-14 DIAGNOSIS — M810 Age-related osteoporosis without current pathological fracture: Secondary | ICD-10-CM | POA: Diagnosis not present

## 2017-05-14 DIAGNOSIS — R5383 Other fatigue: Secondary | ICD-10-CM | POA: Diagnosis not present

## 2017-05-14 DIAGNOSIS — I509 Heart failure, unspecified: Secondary | ICD-10-CM | POA: Diagnosis not present

## 2017-05-14 DIAGNOSIS — I5043 Acute on chronic combined systolic (congestive) and diastolic (congestive) heart failure: Secondary | ICD-10-CM | POA: Diagnosis present

## 2017-05-14 DIAGNOSIS — I5022 Chronic systolic (congestive) heart failure: Secondary | ICD-10-CM | POA: Diagnosis not present

## 2017-05-14 DIAGNOSIS — I10 Essential (primary) hypertension: Secondary | ICD-10-CM | POA: Diagnosis not present

## 2017-05-14 DIAGNOSIS — G459 Transient cerebral ischemic attack, unspecified: Secondary | ICD-10-CM | POA: Diagnosis not present

## 2017-05-14 DIAGNOSIS — J90 Pleural effusion, not elsewhere classified: Secondary | ICD-10-CM | POA: Diagnosis present

## 2017-05-14 DIAGNOSIS — R404 Transient alteration of awareness: Secondary | ICD-10-CM | POA: Diagnosis not present

## 2017-05-14 DIAGNOSIS — J9621 Acute and chronic respiratory failure with hypoxia: Secondary | ICD-10-CM | POA: Diagnosis present

## 2017-05-14 DIAGNOSIS — R279 Unspecified lack of coordination: Secondary | ICD-10-CM | POA: Diagnosis not present

## 2017-05-14 DIAGNOSIS — L97529 Non-pressure chronic ulcer of other part of left foot with unspecified severity: Secondary | ICD-10-CM | POA: Diagnosis present

## 2017-05-14 DIAGNOSIS — D696 Thrombocytopenia, unspecified: Secondary | ICD-10-CM | POA: Diagnosis not present

## 2017-05-14 DIAGNOSIS — J9601 Acute respiratory failure with hypoxia: Secondary | ICD-10-CM | POA: Diagnosis not present

## 2017-05-14 DIAGNOSIS — M79675 Pain in left toe(s): Secondary | ICD-10-CM | POA: Diagnosis not present

## 2017-05-14 DIAGNOSIS — E11621 Type 2 diabetes mellitus with foot ulcer: Secondary | ICD-10-CM | POA: Diagnosis present

## 2017-05-14 DIAGNOSIS — Z23 Encounter for immunization: Secondary | ICD-10-CM | POA: Diagnosis not present

## 2017-05-14 DIAGNOSIS — R41841 Cognitive communication deficit: Secondary | ICD-10-CM | POA: Diagnosis not present

## 2017-05-14 DIAGNOSIS — I739 Peripheral vascular disease, unspecified: Secondary | ICD-10-CM | POA: Diagnosis not present

## 2017-05-14 DIAGNOSIS — Z993 Dependence on wheelchair: Secondary | ICD-10-CM | POA: Diagnosis not present

## 2017-05-14 DIAGNOSIS — E559 Vitamin D deficiency, unspecified: Secondary | ICD-10-CM | POA: Diagnosis not present

## 2017-05-14 DIAGNOSIS — L603 Nail dystrophy: Secondary | ICD-10-CM | POA: Diagnosis not present

## 2017-05-14 DIAGNOSIS — N390 Urinary tract infection, site not specified: Secondary | ICD-10-CM | POA: Diagnosis present

## 2017-05-14 DIAGNOSIS — I4891 Unspecified atrial fibrillation: Secondary | ICD-10-CM | POA: Diagnosis not present

## 2017-05-14 DIAGNOSIS — I481 Persistent atrial fibrillation: Secondary | ICD-10-CM | POA: Diagnosis not present

## 2017-05-18 DIAGNOSIS — S8255XA Nondisplaced fracture of medial malleolus of left tibia, initial encounter for closed fracture: Secondary | ICD-10-CM | POA: Diagnosis not present

## 2017-05-18 DIAGNOSIS — E119 Type 2 diabetes mellitus without complications: Secondary | ICD-10-CM | POA: Diagnosis not present

## 2017-05-18 DIAGNOSIS — R0600 Dyspnea, unspecified: Secondary | ICD-10-CM | POA: Diagnosis not present

## 2017-05-18 DIAGNOSIS — R0789 Other chest pain: Secondary | ICD-10-CM | POA: Diagnosis not present

## 2017-05-18 DIAGNOSIS — M6281 Muscle weakness (generalized): Secondary | ICD-10-CM | POA: Diagnosis not present

## 2017-05-18 DIAGNOSIS — I671 Cerebral aneurysm, nonruptured: Secondary | ICD-10-CM | POA: Diagnosis not present

## 2017-05-18 DIAGNOSIS — I482 Chronic atrial fibrillation: Secondary | ICD-10-CM | POA: Diagnosis not present

## 2017-05-18 DIAGNOSIS — M810 Age-related osteoporosis without current pathological fracture: Secondary | ICD-10-CM | POA: Diagnosis not present

## 2017-05-18 DIAGNOSIS — R54 Age-related physical debility: Secondary | ICD-10-CM | POA: Diagnosis not present

## 2017-05-18 DIAGNOSIS — G459 Transient cerebral ischemic attack, unspecified: Secondary | ICD-10-CM | POA: Diagnosis not present

## 2017-05-18 DIAGNOSIS — I5042 Chronic combined systolic (congestive) and diastolic (congestive) heart failure: Secondary | ICD-10-CM | POA: Diagnosis not present

## 2017-05-18 DIAGNOSIS — I10 Essential (primary) hypertension: Secondary | ICD-10-CM | POA: Diagnosis not present

## 2017-05-21 DIAGNOSIS — R0789 Other chest pain: Secondary | ICD-10-CM | POA: Diagnosis not present

## 2017-05-21 DIAGNOSIS — M810 Age-related osteoporosis without current pathological fracture: Secondary | ICD-10-CM | POA: Diagnosis not present

## 2017-05-21 DIAGNOSIS — I10 Essential (primary) hypertension: Secondary | ICD-10-CM | POA: Diagnosis not present

## 2017-05-21 DIAGNOSIS — S8255XA Nondisplaced fracture of medial malleolus of left tibia, initial encounter for closed fracture: Secondary | ICD-10-CM | POA: Diagnosis not present

## 2017-05-21 DIAGNOSIS — G459 Transient cerebral ischemic attack, unspecified: Secondary | ICD-10-CM | POA: Diagnosis not present

## 2017-05-21 DIAGNOSIS — R54 Age-related physical debility: Secondary | ICD-10-CM | POA: Diagnosis not present

## 2017-05-21 DIAGNOSIS — M6281 Muscle weakness (generalized): Secondary | ICD-10-CM | POA: Diagnosis not present

## 2017-05-21 DIAGNOSIS — I671 Cerebral aneurysm, nonruptured: Secondary | ICD-10-CM | POA: Diagnosis not present

## 2017-05-21 DIAGNOSIS — I5042 Chronic combined systolic (congestive) and diastolic (congestive) heart failure: Secondary | ICD-10-CM | POA: Diagnosis not present

## 2017-05-21 DIAGNOSIS — I482 Chronic atrial fibrillation: Secondary | ICD-10-CM | POA: Diagnosis not present

## 2017-05-21 DIAGNOSIS — R0600 Dyspnea, unspecified: Secondary | ICD-10-CM | POA: Diagnosis not present

## 2017-05-21 DIAGNOSIS — E119 Type 2 diabetes mellitus without complications: Secondary | ICD-10-CM | POA: Diagnosis not present

## 2017-05-24 DIAGNOSIS — I5022 Chronic systolic (congestive) heart failure: Secondary | ICD-10-CM | POA: Diagnosis not present

## 2017-05-24 DIAGNOSIS — M810 Age-related osteoporosis without current pathological fracture: Secondary | ICD-10-CM | POA: Diagnosis not present

## 2017-05-24 DIAGNOSIS — I5042 Chronic combined systolic (congestive) and diastolic (congestive) heart failure: Secondary | ICD-10-CM | POA: Diagnosis not present

## 2017-05-24 DIAGNOSIS — E119 Type 2 diabetes mellitus without complications: Secondary | ICD-10-CM | POA: Diagnosis not present

## 2017-05-24 DIAGNOSIS — I671 Cerebral aneurysm, nonruptured: Secondary | ICD-10-CM | POA: Diagnosis not present

## 2017-05-24 DIAGNOSIS — R54 Age-related physical debility: Secondary | ICD-10-CM | POA: Diagnosis not present

## 2017-05-24 DIAGNOSIS — I481 Persistent atrial fibrillation: Secondary | ICD-10-CM | POA: Diagnosis not present

## 2017-05-24 DIAGNOSIS — Q909 Down syndrome, unspecified: Secondary | ICD-10-CM | POA: Diagnosis not present

## 2017-05-24 DIAGNOSIS — I482 Chronic atrial fibrillation: Secondary | ICD-10-CM | POA: Diagnosis not present

## 2017-05-24 DIAGNOSIS — M6281 Muscle weakness (generalized): Secondary | ICD-10-CM | POA: Diagnosis not present

## 2017-05-24 DIAGNOSIS — I10 Essential (primary) hypertension: Secondary | ICD-10-CM | POA: Diagnosis not present

## 2017-06-01 DIAGNOSIS — S90422A Blister (nonthermal), left great toe, initial encounter: Secondary | ICD-10-CM | POA: Diagnosis not present

## 2017-06-01 DIAGNOSIS — M79675 Pain in left toe(s): Secondary | ICD-10-CM | POA: Diagnosis not present

## 2017-06-02 DIAGNOSIS — M6281 Muscle weakness (generalized): Secondary | ICD-10-CM | POA: Diagnosis not present

## 2017-06-02 DIAGNOSIS — M810 Age-related osteoporosis without current pathological fracture: Secondary | ICD-10-CM | POA: Diagnosis not present

## 2017-06-02 DIAGNOSIS — I5022 Chronic systolic (congestive) heart failure: Secondary | ICD-10-CM | POA: Diagnosis not present

## 2017-06-02 DIAGNOSIS — E119 Type 2 diabetes mellitus without complications: Secondary | ICD-10-CM | POA: Diagnosis not present

## 2017-06-02 DIAGNOSIS — I10 Essential (primary) hypertension: Secondary | ICD-10-CM | POA: Diagnosis not present

## 2017-06-02 DIAGNOSIS — I5042 Chronic combined systolic (congestive) and diastolic (congestive) heart failure: Secondary | ICD-10-CM | POA: Diagnosis not present

## 2017-06-02 DIAGNOSIS — R54 Age-related physical debility: Secondary | ICD-10-CM | POA: Diagnosis not present

## 2017-06-02 DIAGNOSIS — I429 Cardiomyopathy, unspecified: Secondary | ICD-10-CM | POA: Diagnosis not present

## 2017-06-02 DIAGNOSIS — I671 Cerebral aneurysm, nonruptured: Secondary | ICD-10-CM | POA: Diagnosis not present

## 2017-06-02 DIAGNOSIS — I481 Persistent atrial fibrillation: Secondary | ICD-10-CM | POA: Diagnosis not present

## 2017-06-02 DIAGNOSIS — Q909 Down syndrome, unspecified: Secondary | ICD-10-CM | POA: Diagnosis not present

## 2017-06-02 DIAGNOSIS — I482 Chronic atrial fibrillation: Secondary | ICD-10-CM | POA: Diagnosis not present

## 2017-06-11 DIAGNOSIS — I671 Cerebral aneurysm, nonruptured: Secondary | ICD-10-CM | POA: Diagnosis not present

## 2017-06-11 DIAGNOSIS — R54 Age-related physical debility: Secondary | ICD-10-CM | POA: Diagnosis not present

## 2017-06-11 DIAGNOSIS — M6281 Muscle weakness (generalized): Secondary | ICD-10-CM | POA: Diagnosis not present

## 2017-06-11 DIAGNOSIS — D6949 Other primary thrombocytopenia: Secondary | ICD-10-CM | POA: Diagnosis not present

## 2017-06-11 DIAGNOSIS — I5042 Chronic combined systolic (congestive) and diastolic (congestive) heart failure: Secondary | ICD-10-CM | POA: Diagnosis not present

## 2017-06-11 DIAGNOSIS — I10 Essential (primary) hypertension: Secondary | ICD-10-CM | POA: Diagnosis not present

## 2017-06-11 DIAGNOSIS — I482 Chronic atrial fibrillation: Secondary | ICD-10-CM | POA: Diagnosis not present

## 2017-06-11 DIAGNOSIS — E119 Type 2 diabetes mellitus without complications: Secondary | ICD-10-CM | POA: Diagnosis not present

## 2017-06-11 DIAGNOSIS — M810 Age-related osteoporosis without current pathological fracture: Secondary | ICD-10-CM | POA: Diagnosis not present

## 2017-06-14 DIAGNOSIS — M6281 Muscle weakness (generalized): Secondary | ICD-10-CM | POA: Diagnosis not present

## 2017-06-14 DIAGNOSIS — E119 Type 2 diabetes mellitus without complications: Secondary | ICD-10-CM | POA: Diagnosis not present

## 2017-06-14 DIAGNOSIS — M810 Age-related osteoporosis without current pathological fracture: Secondary | ICD-10-CM | POA: Diagnosis not present

## 2017-06-14 DIAGNOSIS — I10 Essential (primary) hypertension: Secondary | ICD-10-CM | POA: Diagnosis not present

## 2017-06-14 DIAGNOSIS — D696 Thrombocytopenia, unspecified: Secondary | ICD-10-CM | POA: Diagnosis not present

## 2017-06-14 DIAGNOSIS — R54 Age-related physical debility: Secondary | ICD-10-CM | POA: Diagnosis not present

## 2017-06-14 DIAGNOSIS — I5042 Chronic combined systolic (congestive) and diastolic (congestive) heart failure: Secondary | ICD-10-CM | POA: Diagnosis not present

## 2017-06-14 DIAGNOSIS — I671 Cerebral aneurysm, nonruptured: Secondary | ICD-10-CM | POA: Diagnosis not present

## 2017-06-14 DIAGNOSIS — I482 Chronic atrial fibrillation: Secondary | ICD-10-CM | POA: Diagnosis not present

## 2017-06-17 DIAGNOSIS — E119 Type 2 diabetes mellitus without complications: Secondary | ICD-10-CM | POA: Diagnosis not present

## 2017-06-17 DIAGNOSIS — I10 Essential (primary) hypertension: Secondary | ICD-10-CM | POA: Diagnosis not present

## 2017-06-17 DIAGNOSIS — L97529 Non-pressure chronic ulcer of other part of left foot with unspecified severity: Secondary | ICD-10-CM | POA: Diagnosis not present

## 2017-06-17 DIAGNOSIS — S90829D Blister (nonthermal), unspecified foot, subsequent encounter: Secondary | ICD-10-CM | POA: Diagnosis not present

## 2017-06-17 DIAGNOSIS — I5042 Chronic combined systolic (congestive) and diastolic (congestive) heart failure: Secondary | ICD-10-CM | POA: Diagnosis not present

## 2017-06-17 DIAGNOSIS — I671 Cerebral aneurysm, nonruptured: Secondary | ICD-10-CM | POA: Diagnosis not present

## 2017-06-17 DIAGNOSIS — R54 Age-related physical debility: Secondary | ICD-10-CM | POA: Diagnosis not present

## 2017-06-17 DIAGNOSIS — M810 Age-related osteoporosis without current pathological fracture: Secondary | ICD-10-CM | POA: Diagnosis not present

## 2017-06-17 DIAGNOSIS — R41841 Cognitive communication deficit: Secondary | ICD-10-CM | POA: Diagnosis not present

## 2017-06-17 DIAGNOSIS — M6281 Muscle weakness (generalized): Secondary | ICD-10-CM | POA: Diagnosis not present

## 2017-06-17 DIAGNOSIS — I482 Chronic atrial fibrillation: Secondary | ICD-10-CM | POA: Diagnosis not present

## 2017-06-20 DIAGNOSIS — I482 Chronic atrial fibrillation: Secondary | ICD-10-CM | POA: Diagnosis not present

## 2017-06-20 DIAGNOSIS — Q909 Down syndrome, unspecified: Secondary | ICD-10-CM | POA: Diagnosis not present

## 2017-06-20 DIAGNOSIS — I272 Pulmonary hypertension, unspecified: Secondary | ICD-10-CM | POA: Diagnosis not present

## 2017-06-20 DIAGNOSIS — E559 Vitamin D deficiency, unspecified: Secondary | ICD-10-CM | POA: Diagnosis not present

## 2017-06-20 DIAGNOSIS — I4891 Unspecified atrial fibrillation: Secondary | ICD-10-CM | POA: Diagnosis not present

## 2017-06-20 DIAGNOSIS — I5042 Chronic combined systolic (congestive) and diastolic (congestive) heart failure: Secondary | ICD-10-CM | POA: Diagnosis not present

## 2017-06-20 DIAGNOSIS — M810 Age-related osteoporosis without current pathological fracture: Secondary | ICD-10-CM | POA: Diagnosis not present

## 2017-06-20 DIAGNOSIS — M6281 Muscle weakness (generalized): Secondary | ICD-10-CM | POA: Diagnosis not present

## 2017-06-20 DIAGNOSIS — I671 Cerebral aneurysm, nonruptured: Secondary | ICD-10-CM | POA: Diagnosis not present

## 2017-06-20 DIAGNOSIS — E119 Type 2 diabetes mellitus without complications: Secondary | ICD-10-CM | POA: Diagnosis not present

## 2017-06-20 DIAGNOSIS — G459 Transient cerebral ischemic attack, unspecified: Secondary | ICD-10-CM | POA: Diagnosis not present

## 2017-06-21 DIAGNOSIS — J9600 Acute respiratory failure, unspecified whether with hypoxia or hypercapnia: Secondary | ICD-10-CM | POA: Diagnosis not present

## 2017-06-21 DIAGNOSIS — M79672 Pain in left foot: Secondary | ICD-10-CM | POA: Diagnosis not present

## 2017-06-21 DIAGNOSIS — I482 Chronic atrial fibrillation: Secondary | ICD-10-CM | POA: Diagnosis not present

## 2017-06-21 DIAGNOSIS — I110 Hypertensive heart disease with heart failure: Secondary | ICD-10-CM | POA: Diagnosis not present

## 2017-06-21 DIAGNOSIS — L97529 Non-pressure chronic ulcer of other part of left foot with unspecified severity: Secondary | ICD-10-CM | POA: Diagnosis not present

## 2017-06-21 DIAGNOSIS — L819 Disorder of pigmentation, unspecified: Secondary | ICD-10-CM | POA: Diagnosis not present

## 2017-06-21 DIAGNOSIS — I10 Essential (primary) hypertension: Secondary | ICD-10-CM | POA: Diagnosis not present

## 2017-06-21 DIAGNOSIS — N390 Urinary tract infection, site not specified: Secondary | ICD-10-CM | POA: Diagnosis not present

## 2017-06-21 DIAGNOSIS — I671 Cerebral aneurysm, nonruptured: Secondary | ICD-10-CM | POA: Diagnosis not present

## 2017-06-21 DIAGNOSIS — I5042 Chronic combined systolic (congestive) and diastolic (congestive) heart failure: Secondary | ICD-10-CM | POA: Diagnosis not present

## 2017-06-21 DIAGNOSIS — J9621 Acute and chronic respiratory failure with hypoxia: Secondary | ICD-10-CM | POA: Diagnosis not present

## 2017-06-21 DIAGNOSIS — R5383 Other fatigue: Secondary | ICD-10-CM | POA: Diagnosis not present

## 2017-06-21 DIAGNOSIS — J9811 Atelectasis: Secondary | ICD-10-CM | POA: Diagnosis not present

## 2017-06-21 DIAGNOSIS — J90 Pleural effusion, not elsewhere classified: Secondary | ICD-10-CM | POA: Diagnosis not present

## 2017-06-21 DIAGNOSIS — R0602 Shortness of breath: Secondary | ICD-10-CM | POA: Diagnosis not present

## 2017-06-21 DIAGNOSIS — Q909 Down syndrome, unspecified: Secondary | ICD-10-CM | POA: Diagnosis not present

## 2017-06-22 DIAGNOSIS — L819 Disorder of pigmentation, unspecified: Secondary | ICD-10-CM | POA: Diagnosis not present

## 2017-06-23 DIAGNOSIS — I482 Chronic atrial fibrillation: Secondary | ICD-10-CM | POA: Diagnosis not present

## 2017-06-23 DIAGNOSIS — I10 Essential (primary) hypertension: Secondary | ICD-10-CM | POA: Diagnosis not present

## 2017-06-23 DIAGNOSIS — R0602 Shortness of breath: Secondary | ICD-10-CM | POA: Diagnosis not present

## 2017-06-23 DIAGNOSIS — M6281 Muscle weakness (generalized): Secondary | ICD-10-CM | POA: Diagnosis not present

## 2017-06-23 DIAGNOSIS — M79672 Pain in left foot: Secondary | ICD-10-CM | POA: Diagnosis not present

## 2017-06-23 DIAGNOSIS — I5042 Chronic combined systolic (congestive) and diastolic (congestive) heart failure: Secondary | ICD-10-CM | POA: Diagnosis not present

## 2017-06-23 DIAGNOSIS — M810 Age-related osteoporosis without current pathological fracture: Secondary | ICD-10-CM | POA: Diagnosis not present

## 2017-06-23 DIAGNOSIS — I671 Cerebral aneurysm, nonruptured: Secondary | ICD-10-CM | POA: Diagnosis not present

## 2017-06-23 DIAGNOSIS — J9600 Acute respiratory failure, unspecified whether with hypoxia or hypercapnia: Secondary | ICD-10-CM | POA: Diagnosis not present

## 2017-06-23 DIAGNOSIS — E119 Type 2 diabetes mellitus without complications: Secondary | ICD-10-CM | POA: Diagnosis not present

## 2017-06-23 DIAGNOSIS — J90 Pleural effusion, not elsewhere classified: Secondary | ICD-10-CM | POA: Diagnosis not present

## 2017-06-24 DIAGNOSIS — J9811 Atelectasis: Secondary | ICD-10-CM | POA: Diagnosis present

## 2017-06-24 DIAGNOSIS — Z993 Dependence on wheelchair: Secondary | ICD-10-CM | POA: Diagnosis not present

## 2017-06-24 DIAGNOSIS — I999 Unspecified disorder of circulatory system: Secondary | ICD-10-CM | POA: Diagnosis not present

## 2017-06-24 DIAGNOSIS — R1312 Dysphagia, oropharyngeal phase: Secondary | ICD-10-CM | POA: Diagnosis not present

## 2017-06-24 DIAGNOSIS — J159 Unspecified bacterial pneumonia: Secondary | ICD-10-CM | POA: Diagnosis not present

## 2017-06-24 DIAGNOSIS — L97522 Non-pressure chronic ulcer of other part of left foot with fat layer exposed: Secondary | ICD-10-CM | POA: Diagnosis not present

## 2017-06-24 DIAGNOSIS — Z8673 Personal history of transient ischemic attack (TIA), and cerebral infarction without residual deficits: Secondary | ICD-10-CM | POA: Diagnosis not present

## 2017-06-24 DIAGNOSIS — I482 Chronic atrial fibrillation: Secondary | ICD-10-CM | POA: Diagnosis present

## 2017-06-24 DIAGNOSIS — I671 Cerebral aneurysm, nonruptured: Secondary | ICD-10-CM | POA: Diagnosis not present

## 2017-06-24 DIAGNOSIS — E119 Type 2 diabetes mellitus without complications: Secondary | ICD-10-CM | POA: Diagnosis present

## 2017-06-24 DIAGNOSIS — I10 Essential (primary) hypertension: Secondary | ICD-10-CM | POA: Diagnosis not present

## 2017-06-24 DIAGNOSIS — L97529 Non-pressure chronic ulcer of other part of left foot with unspecified severity: Secondary | ICD-10-CM | POA: Diagnosis present

## 2017-06-24 DIAGNOSIS — M6281 Muscle weakness (generalized): Secondary | ICD-10-CM | POA: Diagnosis not present

## 2017-06-24 DIAGNOSIS — I5043 Acute on chronic combined systolic (congestive) and diastolic (congestive) heart failure: Secondary | ICD-10-CM | POA: Diagnosis present

## 2017-06-24 DIAGNOSIS — Q909 Down syndrome, unspecified: Secondary | ICD-10-CM | POA: Diagnosis not present

## 2017-06-24 DIAGNOSIS — Z9889 Other specified postprocedural states: Secondary | ICD-10-CM | POA: Diagnosis not present

## 2017-06-24 DIAGNOSIS — I5042 Chronic combined systolic (congestive) and diastolic (congestive) heart failure: Secondary | ICD-10-CM | POA: Diagnosis not present

## 2017-06-24 DIAGNOSIS — G4700 Insomnia, unspecified: Secondary | ICD-10-CM | POA: Diagnosis present

## 2017-06-24 DIAGNOSIS — F09 Unspecified mental disorder due to known physiological condition: Secondary | ICD-10-CM | POA: Diagnosis present

## 2017-06-24 DIAGNOSIS — E11621 Type 2 diabetes mellitus with foot ulcer: Secondary | ICD-10-CM | POA: Diagnosis present

## 2017-06-24 DIAGNOSIS — I110 Hypertensive heart disease with heart failure: Secondary | ICD-10-CM | POA: Diagnosis present

## 2017-06-24 DIAGNOSIS — R262 Difficulty in walking, not elsewhere classified: Secondary | ICD-10-CM | POA: Diagnosis not present

## 2017-06-24 DIAGNOSIS — J9621 Acute and chronic respiratory failure with hypoxia: Secondary | ICD-10-CM | POA: Diagnosis present

## 2017-06-24 DIAGNOSIS — N390 Urinary tract infection, site not specified: Secondary | ICD-10-CM | POA: Diagnosis present

## 2017-06-24 DIAGNOSIS — I429 Cardiomyopathy, unspecified: Secondary | ICD-10-CM | POA: Diagnosis present

## 2017-06-24 DIAGNOSIS — G459 Transient cerebral ischemic attack, unspecified: Secondary | ICD-10-CM | POA: Diagnosis not present

## 2017-06-24 DIAGNOSIS — R0602 Shortness of breath: Secondary | ICD-10-CM | POA: Diagnosis not present

## 2017-06-24 DIAGNOSIS — I272 Pulmonary hypertension, unspecified: Secondary | ICD-10-CM | POA: Diagnosis present

## 2017-06-24 DIAGNOSIS — Z7901 Long term (current) use of anticoagulants: Secondary | ICD-10-CM | POA: Diagnosis not present

## 2017-06-24 DIAGNOSIS — M810 Age-related osteoporosis without current pathological fracture: Secondary | ICD-10-CM | POA: Diagnosis not present

## 2017-06-24 DIAGNOSIS — J90 Pleural effusion, not elsewhere classified: Secondary | ICD-10-CM | POA: Diagnosis present

## 2017-06-28 DIAGNOSIS — A419 Sepsis, unspecified organism: Secondary | ICD-10-CM | POA: Diagnosis present

## 2017-06-28 DIAGNOSIS — Z7901 Long term (current) use of anticoagulants: Secondary | ICD-10-CM | POA: Diagnosis not present

## 2017-06-28 DIAGNOSIS — E876 Hypokalemia: Secondary | ICD-10-CM | POA: Diagnosis not present

## 2017-06-28 DIAGNOSIS — R918 Other nonspecific abnormal finding of lung field: Secondary | ICD-10-CM | POA: Diagnosis not present

## 2017-06-28 DIAGNOSIS — J159 Unspecified bacterial pneumonia: Secondary | ICD-10-CM | POA: Diagnosis not present

## 2017-06-28 DIAGNOSIS — B351 Tinea unguium: Secondary | ICD-10-CM | POA: Diagnosis not present

## 2017-06-28 DIAGNOSIS — I5023 Acute on chronic systolic (congestive) heart failure: Secondary | ICD-10-CM | POA: Diagnosis present

## 2017-06-28 DIAGNOSIS — G459 Transient cerebral ischemic attack, unspecified: Secondary | ICD-10-CM | POA: Diagnosis not present

## 2017-06-28 DIAGNOSIS — E119 Type 2 diabetes mellitus without complications: Secondary | ICD-10-CM | POA: Diagnosis present

## 2017-06-28 DIAGNOSIS — L97522 Non-pressure chronic ulcer of other part of left foot with fat layer exposed: Secondary | ICD-10-CM | POA: Diagnosis not present

## 2017-06-28 DIAGNOSIS — J90 Pleural effusion, not elsewhere classified: Secondary | ICD-10-CM | POA: Diagnosis not present

## 2017-06-28 DIAGNOSIS — R0689 Other abnormalities of breathing: Secondary | ICD-10-CM | POA: Diagnosis not present

## 2017-06-28 DIAGNOSIS — I272 Pulmonary hypertension, unspecified: Secondary | ICD-10-CM | POA: Diagnosis present

## 2017-06-28 DIAGNOSIS — I509 Heart failure, unspecified: Secondary | ICD-10-CM | POA: Diagnosis not present

## 2017-06-28 DIAGNOSIS — J918 Pleural effusion in other conditions classified elsewhere: Secondary | ICD-10-CM | POA: Diagnosis present

## 2017-06-28 DIAGNOSIS — M810 Age-related osteoporosis without current pathological fracture: Secondary | ICD-10-CM | POA: Diagnosis not present

## 2017-06-28 DIAGNOSIS — R0789 Other chest pain: Secondary | ICD-10-CM | POA: Diagnosis not present

## 2017-06-28 DIAGNOSIS — L97521 Non-pressure chronic ulcer of other part of left foot limited to breakdown of skin: Secondary | ICD-10-CM | POA: Diagnosis not present

## 2017-06-28 DIAGNOSIS — I5042 Chronic combined systolic (congestive) and diastolic (congestive) heart failure: Secondary | ICD-10-CM | POA: Diagnosis not present

## 2017-06-28 DIAGNOSIS — M216X1 Other acquired deformities of right foot: Secondary | ICD-10-CM | POA: Diagnosis not present

## 2017-06-28 DIAGNOSIS — R41841 Cognitive communication deficit: Secondary | ICD-10-CM | POA: Diagnosis not present

## 2017-06-28 DIAGNOSIS — J811 Chronic pulmonary edema: Secondary | ICD-10-CM | POA: Diagnosis not present

## 2017-06-28 DIAGNOSIS — E1159 Type 2 diabetes mellitus with other circulatory complications: Secondary | ICD-10-CM | POA: Diagnosis not present

## 2017-06-28 DIAGNOSIS — J810 Acute pulmonary edema: Secondary | ICD-10-CM | POA: Diagnosis not present

## 2017-06-28 DIAGNOSIS — Q845 Enlarged and hypertrophic nails: Secondary | ICD-10-CM | POA: Diagnosis not present

## 2017-06-28 DIAGNOSIS — I110 Hypertensive heart disease with heart failure: Secondary | ICD-10-CM | POA: Diagnosis present

## 2017-06-28 DIAGNOSIS — R634 Abnormal weight loss: Secondary | ICD-10-CM | POA: Diagnosis not present

## 2017-06-28 DIAGNOSIS — R0609 Other forms of dyspnea: Secondary | ICD-10-CM | POA: Diagnosis not present

## 2017-06-28 DIAGNOSIS — I671 Cerebral aneurysm, nonruptured: Secondary | ICD-10-CM | POA: Diagnosis not present

## 2017-06-28 DIAGNOSIS — L603 Nail dystrophy: Secondary | ICD-10-CM | POA: Diagnosis not present

## 2017-06-28 DIAGNOSIS — R451 Restlessness and agitation: Secondary | ICD-10-CM | POA: Diagnosis not present

## 2017-06-28 DIAGNOSIS — I5021 Acute systolic (congestive) heart failure: Secondary | ICD-10-CM | POA: Diagnosis not present

## 2017-06-28 DIAGNOSIS — N390 Urinary tract infection, site not specified: Secondary | ICD-10-CM | POA: Diagnosis present

## 2017-06-28 DIAGNOSIS — I10 Essential (primary) hypertension: Secondary | ICD-10-CM | POA: Diagnosis not present

## 2017-06-28 DIAGNOSIS — B372 Candidiasis of skin and nail: Secondary | ICD-10-CM | POA: Diagnosis not present

## 2017-06-28 DIAGNOSIS — Z79899 Other long term (current) drug therapy: Secondary | ICD-10-CM | POA: Diagnosis not present

## 2017-06-28 DIAGNOSIS — R0600 Dyspnea, unspecified: Secondary | ICD-10-CM | POA: Diagnosis not present

## 2017-06-28 DIAGNOSIS — I482 Chronic atrial fibrillation: Secondary | ICD-10-CM | POA: Diagnosis present

## 2017-06-28 DIAGNOSIS — M6281 Muscle weakness (generalized): Secondary | ICD-10-CM | POA: Diagnosis not present

## 2017-06-28 DIAGNOSIS — I420 Dilated cardiomyopathy: Secondary | ICD-10-CM | POA: Diagnosis present

## 2017-06-28 DIAGNOSIS — I739 Peripheral vascular disease, unspecified: Secondary | ICD-10-CM | POA: Diagnosis not present

## 2017-06-28 DIAGNOSIS — I5043 Acute on chronic combined systolic (congestive) and diastolic (congestive) heart failure: Secondary | ICD-10-CM | POA: Diagnosis not present

## 2017-06-28 DIAGNOSIS — Z8673 Personal history of transient ischemic attack (TIA), and cerebral infarction without residual deficits: Secondary | ICD-10-CM | POA: Diagnosis not present

## 2017-06-28 DIAGNOSIS — Z801 Family history of malignant neoplasm of trachea, bronchus and lung: Secondary | ICD-10-CM | POA: Diagnosis not present

## 2017-06-28 DIAGNOSIS — H9190 Unspecified hearing loss, unspecified ear: Secondary | ICD-10-CM | POA: Diagnosis present

## 2017-06-28 DIAGNOSIS — L97529 Non-pressure chronic ulcer of other part of left foot with unspecified severity: Secondary | ICD-10-CM | POA: Diagnosis not present

## 2017-06-28 DIAGNOSIS — L97819 Non-pressure chronic ulcer of other part of right lower leg with unspecified severity: Secondary | ICD-10-CM | POA: Diagnosis not present

## 2017-06-28 DIAGNOSIS — I5033 Acute on chronic diastolic (congestive) heart failure: Secondary | ICD-10-CM | POA: Diagnosis not present

## 2017-06-28 DIAGNOSIS — I4891 Unspecified atrial fibrillation: Secondary | ICD-10-CM | POA: Diagnosis not present

## 2017-06-28 DIAGNOSIS — R262 Difficulty in walking, not elsewhere classified: Secondary | ICD-10-CM | POA: Diagnosis not present

## 2017-06-28 DIAGNOSIS — J189 Pneumonia, unspecified organism: Secondary | ICD-10-CM | POA: Diagnosis not present

## 2017-06-28 DIAGNOSIS — R0602 Shortness of breath: Secondary | ICD-10-CM | POA: Diagnosis not present

## 2017-06-28 DIAGNOSIS — H903 Sensorineural hearing loss, bilateral: Secondary | ICD-10-CM | POA: Diagnosis not present

## 2017-06-28 DIAGNOSIS — R32 Unspecified urinary incontinence: Secondary | ICD-10-CM | POA: Diagnosis present

## 2017-06-28 DIAGNOSIS — E559 Vitamin D deficiency, unspecified: Secondary | ICD-10-CM | POA: Diagnosis present

## 2017-06-28 DIAGNOSIS — I517 Cardiomegaly: Secondary | ICD-10-CM | POA: Diagnosis not present

## 2017-06-28 DIAGNOSIS — Q909 Down syndrome, unspecified: Secondary | ICD-10-CM | POA: Diagnosis not present

## 2017-06-28 DIAGNOSIS — R609 Edema, unspecified: Secondary | ICD-10-CM | POA: Diagnosis not present

## 2017-06-28 DIAGNOSIS — H6123 Impacted cerumen, bilateral: Secondary | ICD-10-CM | POA: Diagnosis not present

## 2017-06-28 DIAGNOSIS — Z9889 Other specified postprocedural states: Secondary | ICD-10-CM | POA: Diagnosis not present

## 2017-06-28 DIAGNOSIS — R0902 Hypoxemia: Secondary | ICD-10-CM | POA: Diagnosis not present

## 2017-06-28 DIAGNOSIS — K219 Gastro-esophageal reflux disease without esophagitis: Secondary | ICD-10-CM | POA: Diagnosis present

## 2017-06-28 DIAGNOSIS — R52 Pain, unspecified: Secondary | ICD-10-CM | POA: Diagnosis not present

## 2017-06-28 DIAGNOSIS — R1312 Dysphagia, oropharyngeal phase: Secondary | ICD-10-CM | POA: Diagnosis not present

## 2017-06-30 DIAGNOSIS — M810 Age-related osteoporosis without current pathological fracture: Secondary | ICD-10-CM | POA: Diagnosis not present

## 2017-06-30 DIAGNOSIS — I482 Chronic atrial fibrillation: Secondary | ICD-10-CM | POA: Diagnosis not present

## 2017-06-30 DIAGNOSIS — H6123 Impacted cerumen, bilateral: Secondary | ICD-10-CM | POA: Diagnosis not present

## 2017-06-30 DIAGNOSIS — I10 Essential (primary) hypertension: Secondary | ICD-10-CM | POA: Diagnosis not present

## 2017-06-30 DIAGNOSIS — I671 Cerebral aneurysm, nonruptured: Secondary | ICD-10-CM | POA: Diagnosis not present

## 2017-06-30 DIAGNOSIS — R0600 Dyspnea, unspecified: Secondary | ICD-10-CM | POA: Diagnosis not present

## 2017-06-30 DIAGNOSIS — J159 Unspecified bacterial pneumonia: Secondary | ICD-10-CM | POA: Diagnosis not present

## 2017-06-30 DIAGNOSIS — E119 Type 2 diabetes mellitus without complications: Secondary | ICD-10-CM | POA: Diagnosis not present

## 2017-06-30 DIAGNOSIS — R52 Pain, unspecified: Secondary | ICD-10-CM | POA: Diagnosis not present

## 2017-06-30 DIAGNOSIS — H903 Sensorineural hearing loss, bilateral: Secondary | ICD-10-CM | POA: Diagnosis not present

## 2017-06-30 DIAGNOSIS — R0789 Other chest pain: Secondary | ICD-10-CM | POA: Diagnosis not present

## 2017-06-30 DIAGNOSIS — I5042 Chronic combined systolic (congestive) and diastolic (congestive) heart failure: Secondary | ICD-10-CM | POA: Diagnosis not present

## 2017-06-30 DIAGNOSIS — J918 Pleural effusion in other conditions classified elsewhere: Secondary | ICD-10-CM | POA: Diagnosis not present

## 2017-06-30 DIAGNOSIS — M6281 Muscle weakness (generalized): Secondary | ICD-10-CM | POA: Diagnosis not present

## 2017-07-02 DIAGNOSIS — I10 Essential (primary) hypertension: Secondary | ICD-10-CM | POA: Diagnosis not present

## 2017-07-02 DIAGNOSIS — J90 Pleural effusion, not elsewhere classified: Secondary | ICD-10-CM | POA: Diagnosis not present

## 2017-07-02 DIAGNOSIS — E119 Type 2 diabetes mellitus without complications: Secondary | ICD-10-CM | POA: Diagnosis not present

## 2017-07-02 DIAGNOSIS — I671 Cerebral aneurysm, nonruptured: Secondary | ICD-10-CM | POA: Diagnosis not present

## 2017-07-02 DIAGNOSIS — I5042 Chronic combined systolic (congestive) and diastolic (congestive) heart failure: Secondary | ICD-10-CM | POA: Diagnosis not present

## 2017-07-02 DIAGNOSIS — R0789 Other chest pain: Secondary | ICD-10-CM | POA: Diagnosis not present

## 2017-07-02 DIAGNOSIS — M6281 Muscle weakness (generalized): Secondary | ICD-10-CM | POA: Diagnosis not present

## 2017-07-02 DIAGNOSIS — R52 Pain, unspecified: Secondary | ICD-10-CM | POA: Diagnosis not present

## 2017-07-02 DIAGNOSIS — M810 Age-related osteoporosis without current pathological fracture: Secondary | ICD-10-CM | POA: Diagnosis not present

## 2017-07-02 DIAGNOSIS — J918 Pleural effusion in other conditions classified elsewhere: Secondary | ICD-10-CM | POA: Diagnosis not present

## 2017-07-02 DIAGNOSIS — J159 Unspecified bacterial pneumonia: Secondary | ICD-10-CM | POA: Diagnosis not present

## 2017-07-02 DIAGNOSIS — R0600 Dyspnea, unspecified: Secondary | ICD-10-CM | POA: Diagnosis not present

## 2017-07-02 DIAGNOSIS — I482 Chronic atrial fibrillation: Secondary | ICD-10-CM | POA: Diagnosis not present

## 2017-07-05 DIAGNOSIS — M6281 Muscle weakness (generalized): Secondary | ICD-10-CM | POA: Diagnosis not present

## 2017-07-05 DIAGNOSIS — M810 Age-related osteoporosis without current pathological fracture: Secondary | ICD-10-CM | POA: Diagnosis not present

## 2017-07-05 DIAGNOSIS — I10 Essential (primary) hypertension: Secondary | ICD-10-CM | POA: Diagnosis not present

## 2017-07-05 DIAGNOSIS — I671 Cerebral aneurysm, nonruptured: Secondary | ICD-10-CM | POA: Diagnosis not present

## 2017-07-05 DIAGNOSIS — R0789 Other chest pain: Secondary | ICD-10-CM | POA: Diagnosis not present

## 2017-07-05 DIAGNOSIS — R0600 Dyspnea, unspecified: Secondary | ICD-10-CM | POA: Diagnosis not present

## 2017-07-05 DIAGNOSIS — I482 Chronic atrial fibrillation: Secondary | ICD-10-CM | POA: Diagnosis not present

## 2017-07-05 DIAGNOSIS — J918 Pleural effusion in other conditions classified elsewhere: Secondary | ICD-10-CM | POA: Diagnosis not present

## 2017-07-05 DIAGNOSIS — R52 Pain, unspecified: Secondary | ICD-10-CM | POA: Diagnosis not present

## 2017-07-05 DIAGNOSIS — E119 Type 2 diabetes mellitus without complications: Secondary | ICD-10-CM | POA: Diagnosis not present

## 2017-07-05 DIAGNOSIS — J159 Unspecified bacterial pneumonia: Secondary | ICD-10-CM | POA: Diagnosis not present

## 2017-07-05 DIAGNOSIS — I5042 Chronic combined systolic (congestive) and diastolic (congestive) heart failure: Secondary | ICD-10-CM | POA: Diagnosis not present

## 2017-07-06 DIAGNOSIS — M6281 Muscle weakness (generalized): Secondary | ICD-10-CM | POA: Diagnosis not present

## 2017-07-06 DIAGNOSIS — R52 Pain, unspecified: Secondary | ICD-10-CM | POA: Diagnosis not present

## 2017-07-06 DIAGNOSIS — I482 Chronic atrial fibrillation: Secondary | ICD-10-CM | POA: Diagnosis not present

## 2017-07-06 DIAGNOSIS — I5042 Chronic combined systolic (congestive) and diastolic (congestive) heart failure: Secondary | ICD-10-CM | POA: Diagnosis not present

## 2017-07-06 DIAGNOSIS — J918 Pleural effusion in other conditions classified elsewhere: Secondary | ICD-10-CM | POA: Diagnosis not present

## 2017-07-06 DIAGNOSIS — E119 Type 2 diabetes mellitus without complications: Secondary | ICD-10-CM | POA: Diagnosis not present

## 2017-07-06 DIAGNOSIS — R0789 Other chest pain: Secondary | ICD-10-CM | POA: Diagnosis not present

## 2017-07-06 DIAGNOSIS — I10 Essential (primary) hypertension: Secondary | ICD-10-CM | POA: Diagnosis not present

## 2017-07-06 DIAGNOSIS — I671 Cerebral aneurysm, nonruptured: Secondary | ICD-10-CM | POA: Diagnosis not present

## 2017-07-06 DIAGNOSIS — R0600 Dyspnea, unspecified: Secondary | ICD-10-CM | POA: Diagnosis not present

## 2017-07-06 DIAGNOSIS — M810 Age-related osteoporosis without current pathological fracture: Secondary | ICD-10-CM | POA: Diagnosis not present

## 2017-07-06 DIAGNOSIS — J159 Unspecified bacterial pneumonia: Secondary | ICD-10-CM | POA: Diagnosis not present

## 2017-07-07 DIAGNOSIS — I671 Cerebral aneurysm, nonruptured: Secondary | ICD-10-CM | POA: Diagnosis not present

## 2017-07-07 DIAGNOSIS — J159 Unspecified bacterial pneumonia: Secondary | ICD-10-CM | POA: Diagnosis not present

## 2017-07-07 DIAGNOSIS — M810 Age-related osteoporosis without current pathological fracture: Secondary | ICD-10-CM | POA: Diagnosis not present

## 2017-07-07 DIAGNOSIS — R52 Pain, unspecified: Secondary | ICD-10-CM | POA: Diagnosis not present

## 2017-07-07 DIAGNOSIS — I5042 Chronic combined systolic (congestive) and diastolic (congestive) heart failure: Secondary | ICD-10-CM | POA: Diagnosis not present

## 2017-07-07 DIAGNOSIS — R0600 Dyspnea, unspecified: Secondary | ICD-10-CM | POA: Diagnosis not present

## 2017-07-07 DIAGNOSIS — R0789 Other chest pain: Secondary | ICD-10-CM | POA: Diagnosis not present

## 2017-07-07 DIAGNOSIS — J918 Pleural effusion in other conditions classified elsewhere: Secondary | ICD-10-CM | POA: Diagnosis not present

## 2017-07-07 DIAGNOSIS — I482 Chronic atrial fibrillation: Secondary | ICD-10-CM | POA: Diagnosis not present

## 2017-07-07 DIAGNOSIS — E119 Type 2 diabetes mellitus without complications: Secondary | ICD-10-CM | POA: Diagnosis not present

## 2017-07-07 DIAGNOSIS — M6281 Muscle weakness (generalized): Secondary | ICD-10-CM | POA: Diagnosis not present

## 2017-07-07 DIAGNOSIS — I10 Essential (primary) hypertension: Secondary | ICD-10-CM | POA: Diagnosis not present

## 2017-07-08 DIAGNOSIS — L97529 Non-pressure chronic ulcer of other part of left foot with unspecified severity: Secondary | ICD-10-CM | POA: Diagnosis not present

## 2017-07-12 DIAGNOSIS — I671 Cerebral aneurysm, nonruptured: Secondary | ICD-10-CM | POA: Diagnosis not present

## 2017-07-12 DIAGNOSIS — J159 Unspecified bacterial pneumonia: Secondary | ICD-10-CM | POA: Diagnosis not present

## 2017-07-12 DIAGNOSIS — I5042 Chronic combined systolic (congestive) and diastolic (congestive) heart failure: Secondary | ICD-10-CM | POA: Diagnosis not present

## 2017-07-12 DIAGNOSIS — I10 Essential (primary) hypertension: Secondary | ICD-10-CM | POA: Diagnosis not present

## 2017-07-12 DIAGNOSIS — I482 Chronic atrial fibrillation: Secondary | ICD-10-CM | POA: Diagnosis not present

## 2017-07-12 DIAGNOSIS — E119 Type 2 diabetes mellitus without complications: Secondary | ICD-10-CM | POA: Diagnosis not present

## 2017-07-12 DIAGNOSIS — R52 Pain, unspecified: Secondary | ICD-10-CM | POA: Diagnosis not present

## 2017-07-12 DIAGNOSIS — R0600 Dyspnea, unspecified: Secondary | ICD-10-CM | POA: Diagnosis not present

## 2017-07-12 DIAGNOSIS — M810 Age-related osteoporosis without current pathological fracture: Secondary | ICD-10-CM | POA: Diagnosis not present

## 2017-07-12 DIAGNOSIS — R0789 Other chest pain: Secondary | ICD-10-CM | POA: Diagnosis not present

## 2017-07-12 DIAGNOSIS — M6281 Muscle weakness (generalized): Secondary | ICD-10-CM | POA: Diagnosis not present

## 2017-07-12 DIAGNOSIS — J918 Pleural effusion in other conditions classified elsewhere: Secondary | ICD-10-CM | POA: Diagnosis not present

## 2017-07-13 DIAGNOSIS — J90 Pleural effusion, not elsewhere classified: Secondary | ICD-10-CM | POA: Diagnosis not present

## 2017-07-13 DIAGNOSIS — I4891 Unspecified atrial fibrillation: Secondary | ICD-10-CM | POA: Diagnosis not present

## 2017-07-13 DIAGNOSIS — I5042 Chronic combined systolic (congestive) and diastolic (congestive) heart failure: Secondary | ICD-10-CM | POA: Diagnosis not present

## 2017-07-13 DIAGNOSIS — I272 Pulmonary hypertension, unspecified: Secondary | ICD-10-CM | POA: Diagnosis not present

## 2017-07-14 DIAGNOSIS — I5042 Chronic combined systolic (congestive) and diastolic (congestive) heart failure: Secondary | ICD-10-CM | POA: Diagnosis not present

## 2017-07-14 DIAGNOSIS — I10 Essential (primary) hypertension: Secondary | ICD-10-CM | POA: Diagnosis not present

## 2017-07-14 DIAGNOSIS — J918 Pleural effusion in other conditions classified elsewhere: Secondary | ICD-10-CM | POA: Diagnosis not present

## 2017-07-14 DIAGNOSIS — J159 Unspecified bacterial pneumonia: Secondary | ICD-10-CM | POA: Diagnosis not present

## 2017-07-14 DIAGNOSIS — I482 Chronic atrial fibrillation: Secondary | ICD-10-CM | POA: Diagnosis not present

## 2017-07-15 DIAGNOSIS — Z9889 Other specified postprocedural states: Secondary | ICD-10-CM | POA: Diagnosis not present

## 2017-07-15 DIAGNOSIS — L97819 Non-pressure chronic ulcer of other part of right lower leg with unspecified severity: Secondary | ICD-10-CM | POA: Diagnosis not present

## 2017-07-15 DIAGNOSIS — L97522 Non-pressure chronic ulcer of other part of left foot with fat layer exposed: Secondary | ICD-10-CM | POA: Diagnosis not present

## 2017-07-15 DIAGNOSIS — J90 Pleural effusion, not elsewhere classified: Secondary | ICD-10-CM | POA: Diagnosis not present

## 2017-07-19 DIAGNOSIS — E119 Type 2 diabetes mellitus without complications: Secondary | ICD-10-CM | POA: Diagnosis not present

## 2017-07-19 DIAGNOSIS — I10 Essential (primary) hypertension: Secondary | ICD-10-CM | POA: Diagnosis not present

## 2017-07-19 DIAGNOSIS — I671 Cerebral aneurysm, nonruptured: Secondary | ICD-10-CM | POA: Diagnosis not present

## 2017-07-19 DIAGNOSIS — L603 Nail dystrophy: Secondary | ICD-10-CM | POA: Diagnosis not present

## 2017-07-19 DIAGNOSIS — I5042 Chronic combined systolic (congestive) and diastolic (congestive) heart failure: Secondary | ICD-10-CM | POA: Diagnosis not present

## 2017-07-19 DIAGNOSIS — J159 Unspecified bacterial pneumonia: Secondary | ICD-10-CM | POA: Diagnosis not present

## 2017-07-19 DIAGNOSIS — R0789 Other chest pain: Secondary | ICD-10-CM | POA: Diagnosis not present

## 2017-07-19 DIAGNOSIS — R52 Pain, unspecified: Secondary | ICD-10-CM | POA: Diagnosis not present

## 2017-07-19 DIAGNOSIS — M216X1 Other acquired deformities of right foot: Secondary | ICD-10-CM | POA: Diagnosis not present

## 2017-07-19 DIAGNOSIS — E1159 Type 2 diabetes mellitus with other circulatory complications: Secondary | ICD-10-CM | POA: Diagnosis not present

## 2017-07-19 DIAGNOSIS — R0600 Dyspnea, unspecified: Secondary | ICD-10-CM | POA: Diagnosis not present

## 2017-07-19 DIAGNOSIS — M6281 Muscle weakness (generalized): Secondary | ICD-10-CM | POA: Diagnosis not present

## 2017-07-19 DIAGNOSIS — I482 Chronic atrial fibrillation: Secondary | ICD-10-CM | POA: Diagnosis not present

## 2017-07-19 DIAGNOSIS — J918 Pleural effusion in other conditions classified elsewhere: Secondary | ICD-10-CM | POA: Diagnosis not present

## 2017-07-19 DIAGNOSIS — J90 Pleural effusion, not elsewhere classified: Secondary | ICD-10-CM | POA: Diagnosis not present

## 2017-07-19 DIAGNOSIS — L97521 Non-pressure chronic ulcer of other part of left foot limited to breakdown of skin: Secondary | ICD-10-CM | POA: Diagnosis not present

## 2017-07-19 DIAGNOSIS — M810 Age-related osteoporosis without current pathological fracture: Secondary | ICD-10-CM | POA: Diagnosis not present

## 2017-07-19 IMAGING — US US CAROTID DUPLEX BILAT
1 series · 13 of 24 positions shown · non-contrast
Comparison: None available

CLINICAL DATA: TIA.  Down syndrome.

EXAM:
BILATERAL CAROTID DUPLEX ULTRASOUND
TECHNIQUE: Gray scale imaging, color Doppler and duplex ultrasound was
performed of bilateral carotid and vertebral arteries in the neck.

[Series 1: us carotid duplex bilat · 0.06mm/px · 13 of 69 slices shown]
[im 1/69]
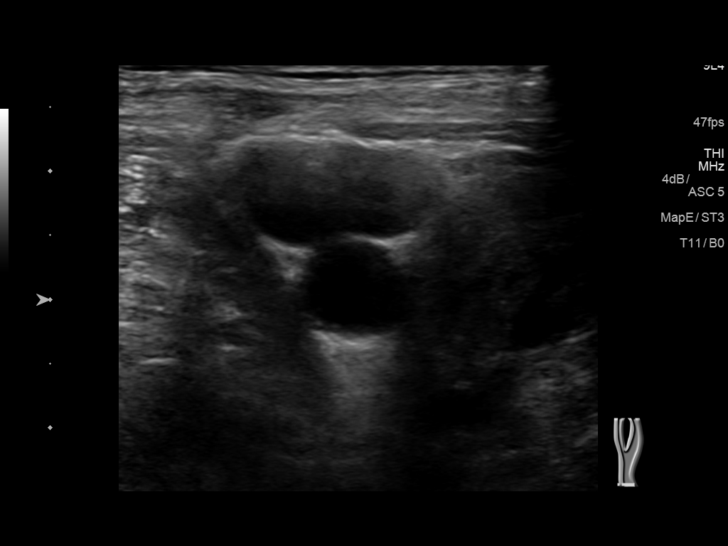
[im 6/69]
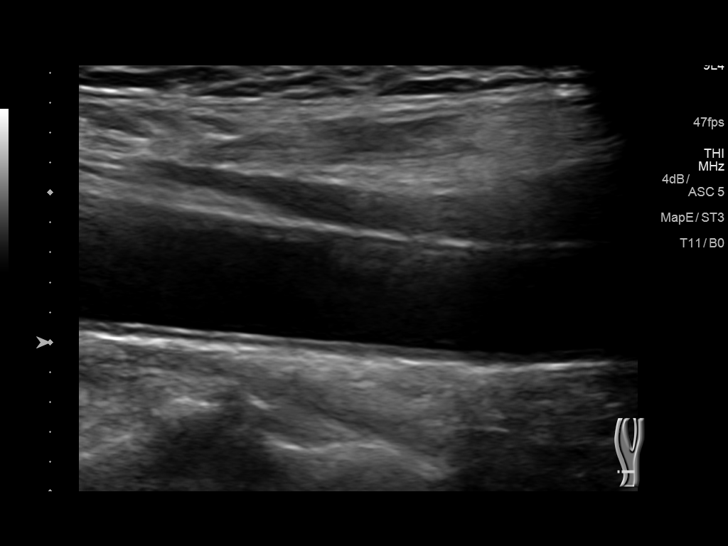
[im 12/69]
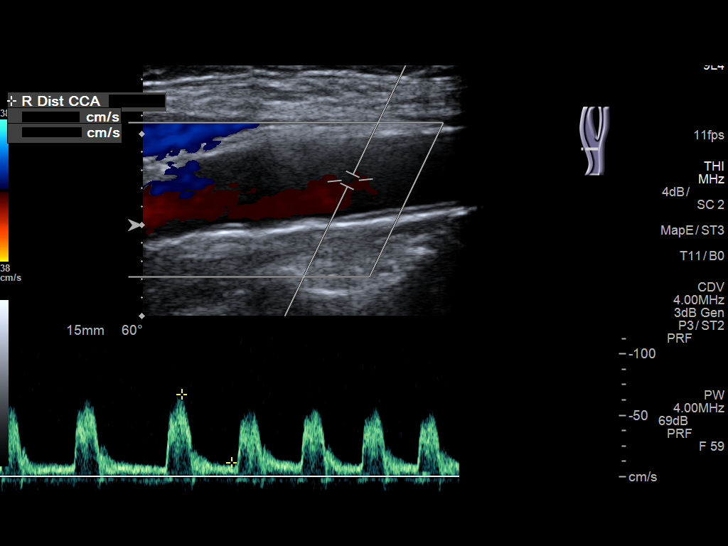
[im 18/69]
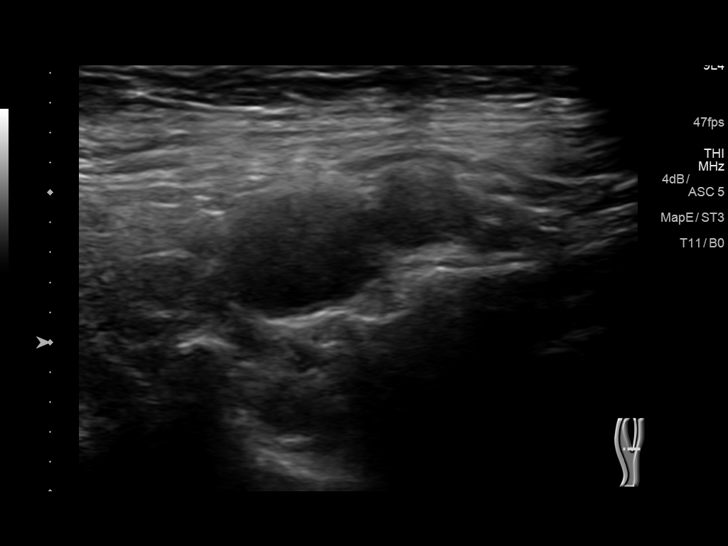
[im 24/69]
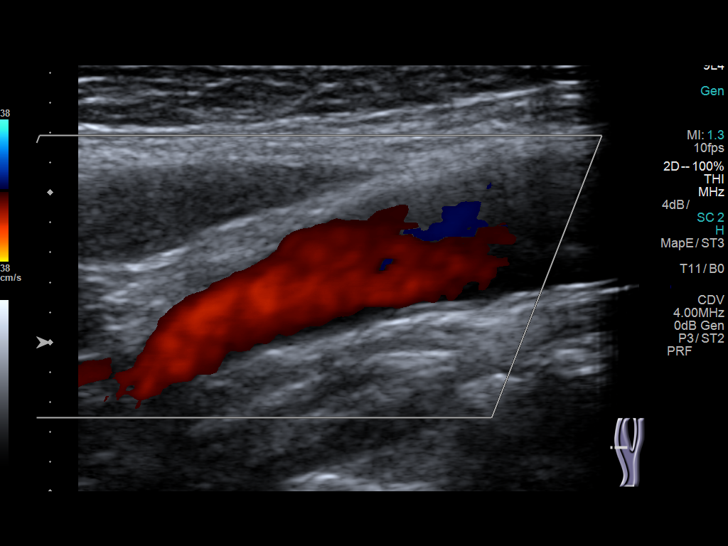
[im 30/69]
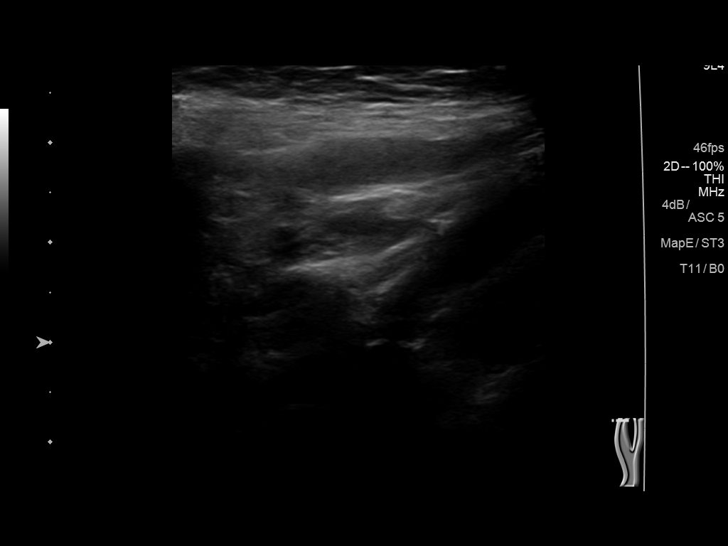
[im 36/69]
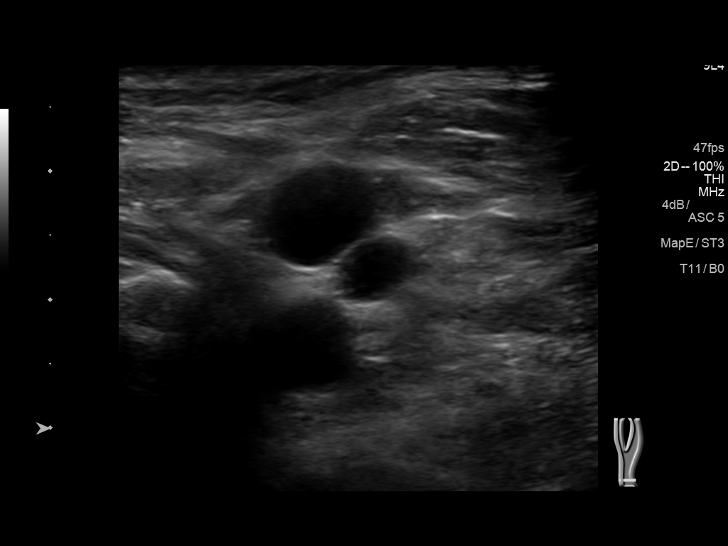
[im 39/69]
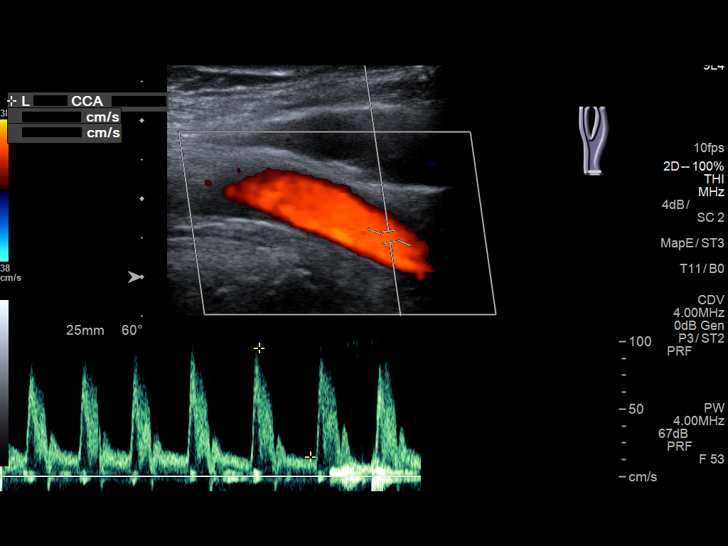
[im 45/69]
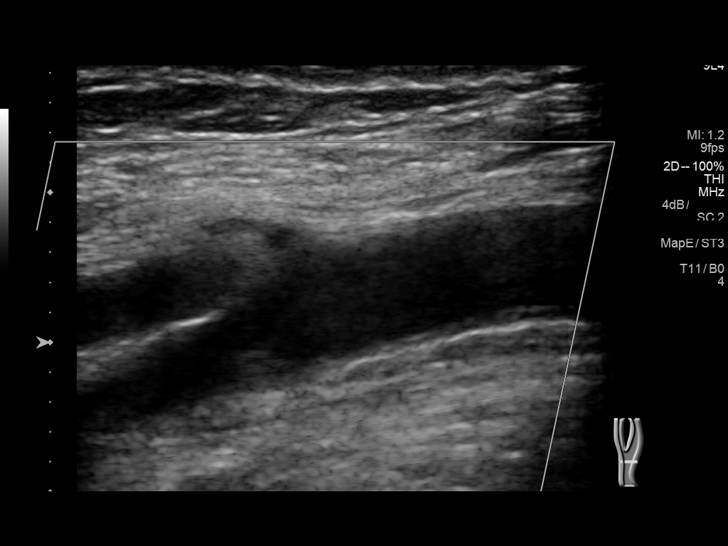
[im 51/69]
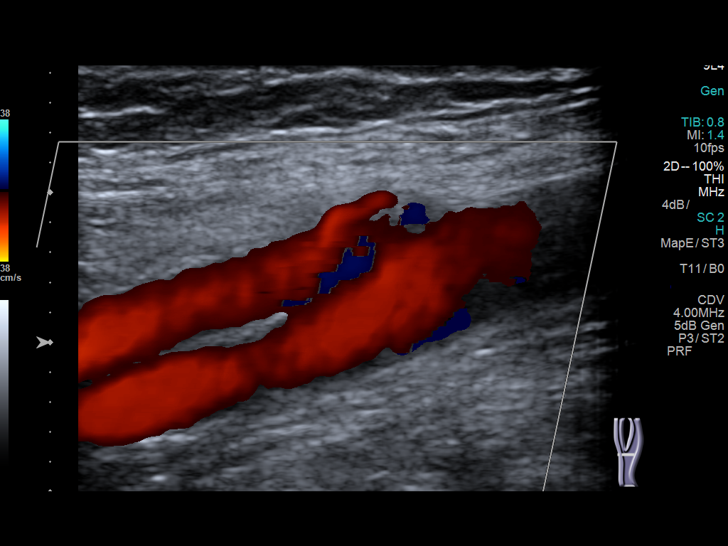
[im 57/69]
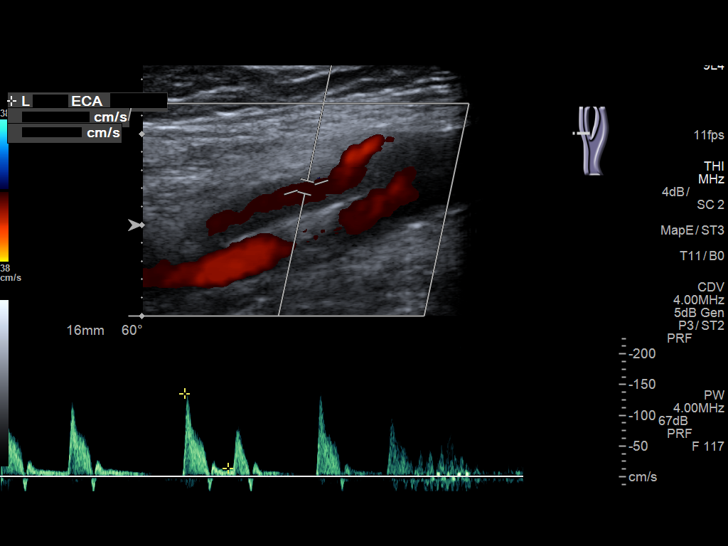
[im 63/69]
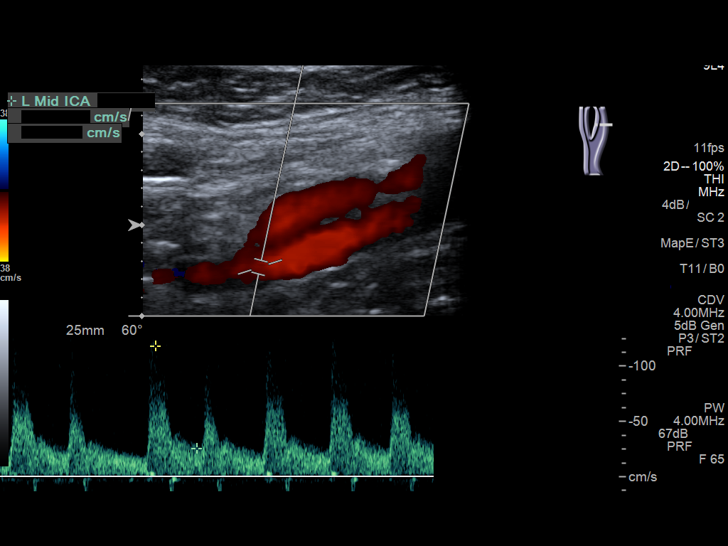
[im 69/69]
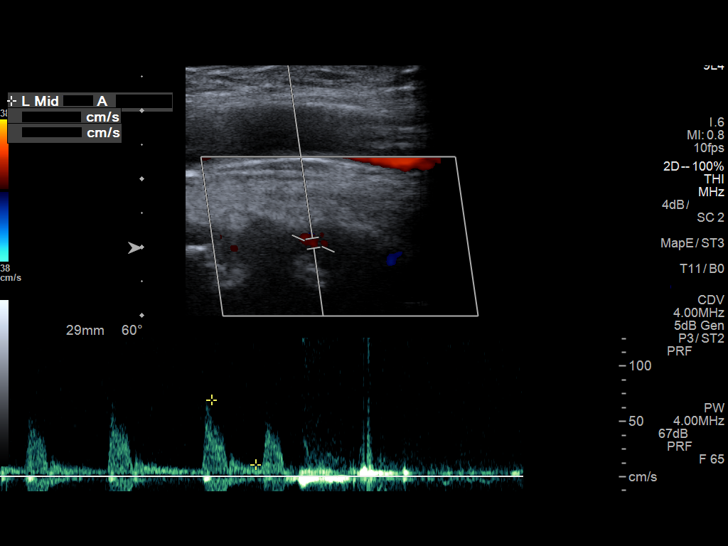

[13 of 24 positions shown; findings below may reference images not displayed]

REVIEW OF SYSTEMS:
Quantification of carotid stenosis is based on velocity parameters
that correlate the residual internal carotid diameter with
NASCET-based stenosis levels, using the diameter of the distal
internal carotid lumen as the denominator for stenosis measurement.

The following velocity measurements were obtained:

PEAK SYSTOLIC/END DIASTOLIC

RIGHT

ICA:                     78/24cm/sec

CCA:                     76/11cm/sec

SYSTOLIC ICA/CCA RATIO:

DIASTOLIC ICA/CCA RATIO:

ECA:                     116cm/sec

LEFT

ICA:                     118/26cm/sec

CCA:                     86/13cm/sec

SYSTOLIC ICA/CCA RATIO:

DIASTOLIC ICA/CCA RATIO:

ECA:                     135cm/sec
FINDINGS: RIGHT CAROTID ARTERY: Mild plaque at the bifurcation. No high-grade
stenosis. Normal waveforms and color Doppler signal.

RIGHT VERTEBRAL ARTERY: Normal flow direction and waveform. A
nonspecific cardiac arrhythmia is noted.

LEFT CAROTID ARTERY: Mild smooth noncalcified plaque in the carotid
bulb extending into proximal internal and external carotid arteries.
No high-grade stenosis. Normal waveforms and color Doppler signal.

LEFT VERTEBRAL ARTERY: Normal flow direction and waveform.
IMPRESSION: 1. Mild bilateral carotid bifurcation plaque resulting in less than
50% diameter stenosis. The exam does not exclude plaque ulceration
or embolization. Continued surveillance recommended.
2. Nonspecific cardiac arrhythmia noted.

## 2017-07-21 DIAGNOSIS — J90 Pleural effusion, not elsewhere classified: Secondary | ICD-10-CM | POA: Diagnosis not present

## 2017-07-21 DIAGNOSIS — R0609 Other forms of dyspnea: Secondary | ICD-10-CM | POA: Diagnosis not present

## 2017-07-21 DIAGNOSIS — I5023 Acute on chronic systolic (congestive) heart failure: Secondary | ICD-10-CM | POA: Diagnosis not present

## 2017-07-21 DIAGNOSIS — I482 Chronic atrial fibrillation: Secondary | ICD-10-CM | POA: Diagnosis not present

## 2017-07-21 DIAGNOSIS — R0600 Dyspnea, unspecified: Secondary | ICD-10-CM | POA: Diagnosis not present

## 2017-07-21 DIAGNOSIS — I517 Cardiomegaly: Secondary | ICD-10-CM | POA: Diagnosis not present

## 2017-07-21 DIAGNOSIS — I272 Pulmonary hypertension, unspecified: Secondary | ICD-10-CM | POA: Diagnosis not present

## 2017-07-21 DIAGNOSIS — R918 Other nonspecific abnormal finding of lung field: Secondary | ICD-10-CM | POA: Diagnosis not present

## 2017-07-22 DIAGNOSIS — L97529 Non-pressure chronic ulcer of other part of left foot with unspecified severity: Secondary | ICD-10-CM | POA: Diagnosis not present

## 2017-07-28 DIAGNOSIS — I272 Pulmonary hypertension, unspecified: Secondary | ICD-10-CM | POA: Diagnosis not present

## 2017-07-28 DIAGNOSIS — I4891 Unspecified atrial fibrillation: Secondary | ICD-10-CM | POA: Diagnosis not present

## 2017-07-28 DIAGNOSIS — I5042 Chronic combined systolic (congestive) and diastolic (congestive) heart failure: Secondary | ICD-10-CM | POA: Diagnosis not present

## 2017-07-28 DIAGNOSIS — J90 Pleural effusion, not elsewhere classified: Secondary | ICD-10-CM | POA: Diagnosis not present

## 2017-07-30 DIAGNOSIS — E119 Type 2 diabetes mellitus without complications: Secondary | ICD-10-CM | POA: Diagnosis not present

## 2017-07-30 DIAGNOSIS — R0600 Dyspnea, unspecified: Secondary | ICD-10-CM | POA: Diagnosis not present

## 2017-07-30 DIAGNOSIS — J918 Pleural effusion in other conditions classified elsewhere: Secondary | ICD-10-CM | POA: Diagnosis not present

## 2017-07-30 DIAGNOSIS — I5042 Chronic combined systolic (congestive) and diastolic (congestive) heart failure: Secondary | ICD-10-CM | POA: Diagnosis not present

## 2017-07-30 DIAGNOSIS — I482 Chronic atrial fibrillation: Secondary | ICD-10-CM | POA: Diagnosis not present

## 2017-07-30 DIAGNOSIS — J159 Unspecified bacterial pneumonia: Secondary | ICD-10-CM | POA: Diagnosis not present

## 2017-07-30 DIAGNOSIS — M810 Age-related osteoporosis without current pathological fracture: Secondary | ICD-10-CM | POA: Diagnosis not present

## 2017-07-30 DIAGNOSIS — M6281 Muscle weakness (generalized): Secondary | ICD-10-CM | POA: Diagnosis not present

## 2017-07-30 DIAGNOSIS — R0789 Other chest pain: Secondary | ICD-10-CM | POA: Diagnosis not present

## 2017-07-30 DIAGNOSIS — I671 Cerebral aneurysm, nonruptured: Secondary | ICD-10-CM | POA: Diagnosis not present

## 2017-07-30 DIAGNOSIS — I10 Essential (primary) hypertension: Secondary | ICD-10-CM | POA: Diagnosis not present

## 2017-07-30 DIAGNOSIS — R52 Pain, unspecified: Secondary | ICD-10-CM | POA: Diagnosis not present

## 2017-08-04 DIAGNOSIS — I5042 Chronic combined systolic (congestive) and diastolic (congestive) heart failure: Secondary | ICD-10-CM | POA: Diagnosis not present

## 2017-08-04 DIAGNOSIS — I272 Pulmonary hypertension, unspecified: Secondary | ICD-10-CM | POA: Diagnosis not present

## 2017-08-04 DIAGNOSIS — J90 Pleural effusion, not elsewhere classified: Secondary | ICD-10-CM | POA: Diagnosis not present

## 2017-08-04 DIAGNOSIS — I4891 Unspecified atrial fibrillation: Secondary | ICD-10-CM | POA: Diagnosis not present

## 2017-08-05 DIAGNOSIS — L97529 Non-pressure chronic ulcer of other part of left foot with unspecified severity: Secondary | ICD-10-CM | POA: Diagnosis not present

## 2017-08-06 DIAGNOSIS — I10 Essential (primary) hypertension: Secondary | ICD-10-CM | POA: Diagnosis not present

## 2017-08-06 DIAGNOSIS — I482 Chronic atrial fibrillation: Secondary | ICD-10-CM | POA: Diagnosis not present

## 2017-08-06 DIAGNOSIS — I5042 Chronic combined systolic (congestive) and diastolic (congestive) heart failure: Secondary | ICD-10-CM | POA: Diagnosis not present

## 2017-08-06 DIAGNOSIS — R0600 Dyspnea, unspecified: Secondary | ICD-10-CM | POA: Diagnosis not present

## 2017-08-06 DIAGNOSIS — J918 Pleural effusion in other conditions classified elsewhere: Secondary | ICD-10-CM | POA: Diagnosis not present

## 2017-08-06 DIAGNOSIS — J159 Unspecified bacterial pneumonia: Secondary | ICD-10-CM | POA: Diagnosis not present

## 2017-08-06 DIAGNOSIS — M6281 Muscle weakness (generalized): Secondary | ICD-10-CM | POA: Diagnosis not present

## 2017-08-09 DIAGNOSIS — R41841 Cognitive communication deficit: Secondary | ICD-10-CM | POA: Diagnosis not present

## 2017-08-09 DIAGNOSIS — I4891 Unspecified atrial fibrillation: Secondary | ICD-10-CM | POA: Diagnosis not present

## 2017-08-09 DIAGNOSIS — I10 Essential (primary) hypertension: Secondary | ICD-10-CM | POA: Diagnosis not present

## 2017-08-09 DIAGNOSIS — I671 Cerebral aneurysm, nonruptured: Secondary | ICD-10-CM | POA: Diagnosis not present

## 2017-08-09 DIAGNOSIS — R0600 Dyspnea, unspecified: Secondary | ICD-10-CM | POA: Diagnosis not present

## 2017-08-09 DIAGNOSIS — R52 Pain, unspecified: Secondary | ICD-10-CM | POA: Diagnosis not present

## 2017-08-09 DIAGNOSIS — E559 Vitamin D deficiency, unspecified: Secondary | ICD-10-CM | POA: Diagnosis not present

## 2017-08-09 DIAGNOSIS — R451 Restlessness and agitation: Secondary | ICD-10-CM | POA: Diagnosis not present

## 2017-08-09 DIAGNOSIS — B372 Candidiasis of skin and nail: Secondary | ICD-10-CM | POA: Diagnosis not present

## 2017-08-09 DIAGNOSIS — M6281 Muscle weakness (generalized): Secondary | ICD-10-CM | POA: Diagnosis not present

## 2017-08-09 DIAGNOSIS — I5042 Chronic combined systolic (congestive) and diastolic (congestive) heart failure: Secondary | ICD-10-CM | POA: Diagnosis not present

## 2017-08-09 DIAGNOSIS — I482 Chronic atrial fibrillation: Secondary | ICD-10-CM | POA: Diagnosis not present

## 2017-08-09 DIAGNOSIS — M810 Age-related osteoporosis without current pathological fracture: Secondary | ICD-10-CM | POA: Diagnosis not present

## 2017-08-09 DIAGNOSIS — J90 Pleural effusion, not elsewhere classified: Secondary | ICD-10-CM | POA: Diagnosis not present

## 2017-08-09 DIAGNOSIS — E119 Type 2 diabetes mellitus without complications: Secondary | ICD-10-CM | POA: Diagnosis not present

## 2017-08-16 DIAGNOSIS — I5042 Chronic combined systolic (congestive) and diastolic (congestive) heart failure: Secondary | ICD-10-CM | POA: Diagnosis not present

## 2017-08-16 DIAGNOSIS — E119 Type 2 diabetes mellitus without complications: Secondary | ICD-10-CM | POA: Diagnosis not present

## 2017-08-16 DIAGNOSIS — I4891 Unspecified atrial fibrillation: Secondary | ICD-10-CM | POA: Diagnosis not present

## 2017-08-18 DIAGNOSIS — I5033 Acute on chronic diastolic (congestive) heart failure: Secondary | ICD-10-CM | POA: Diagnosis not present

## 2017-08-18 DIAGNOSIS — E876 Hypokalemia: Secondary | ICD-10-CM | POA: Diagnosis not present

## 2017-08-18 DIAGNOSIS — R0789 Other chest pain: Secondary | ICD-10-CM | POA: Diagnosis not present

## 2017-08-18 DIAGNOSIS — R609 Edema, unspecified: Secondary | ICD-10-CM | POA: Diagnosis not present

## 2017-08-19 DIAGNOSIS — L97529 Non-pressure chronic ulcer of other part of left foot with unspecified severity: Secondary | ICD-10-CM | POA: Diagnosis not present

## 2017-08-31 IMAGING — DX DG ABDOMEN ACUTE W/ 1V CHEST
3 series · 3 of 3 positions shown · non-contrast
Comparison: 12/25/2015

CLINICAL DATA: Nausea and vomiting beginning today. Atrial
fibrillation. Systolic dysfunction. Hypertension.

EXAM:
DG ABDOMEN ACUTE W/ 1V CHEST

[abdomen erect]
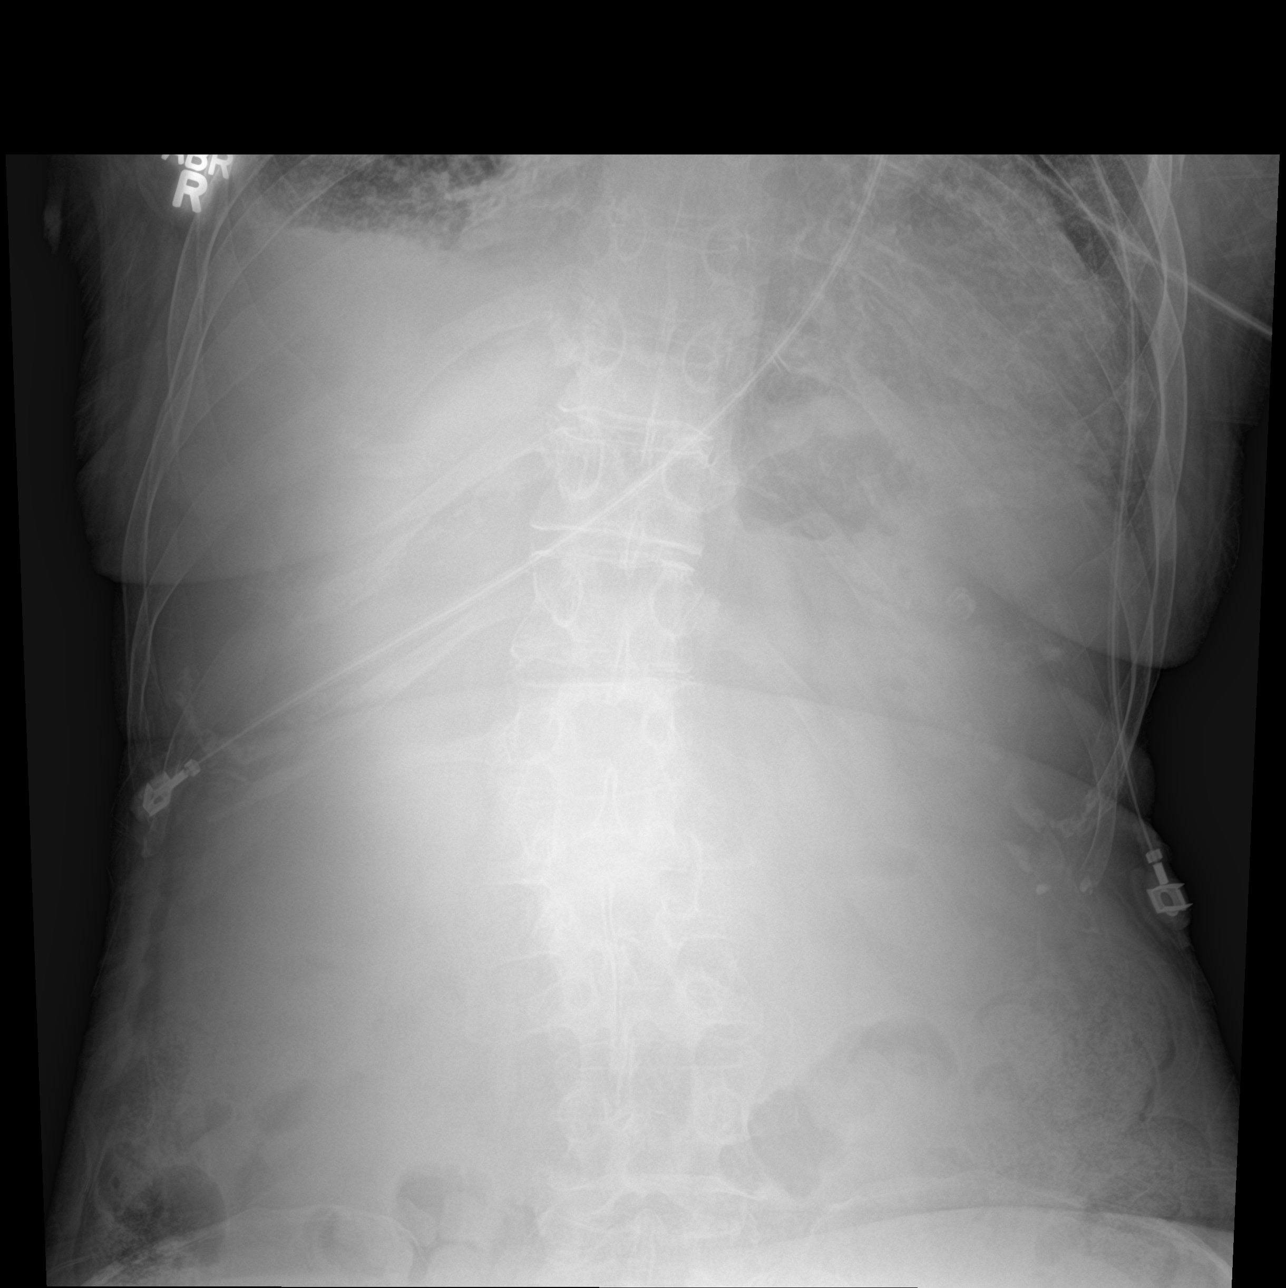

[abdomen supine]
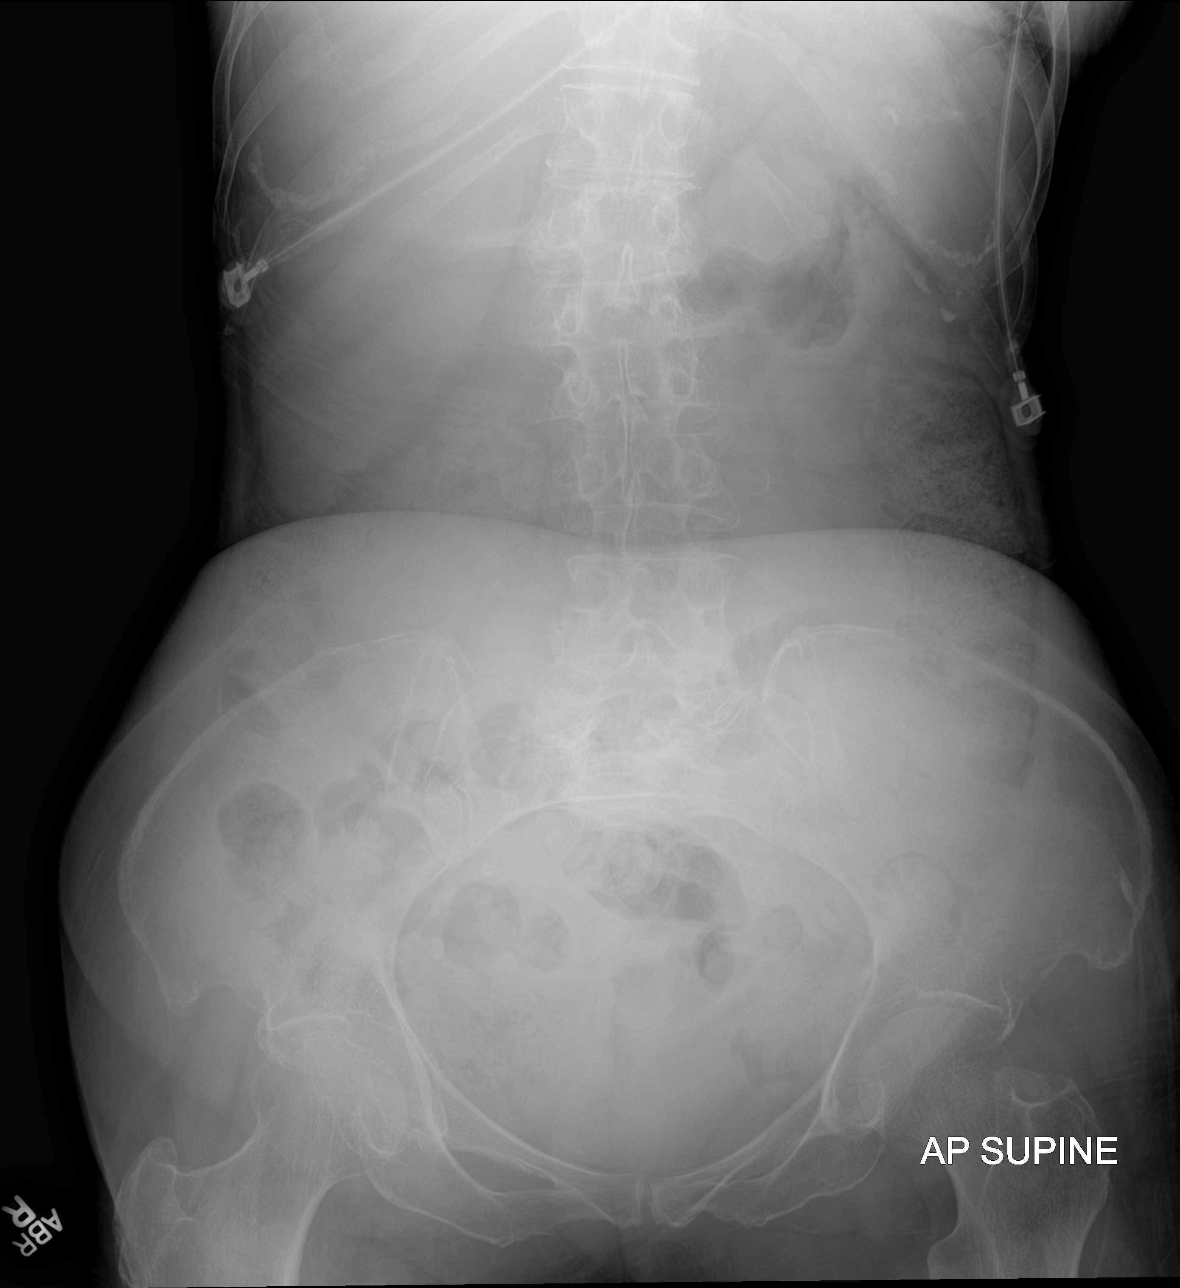

[chest ap strecther]
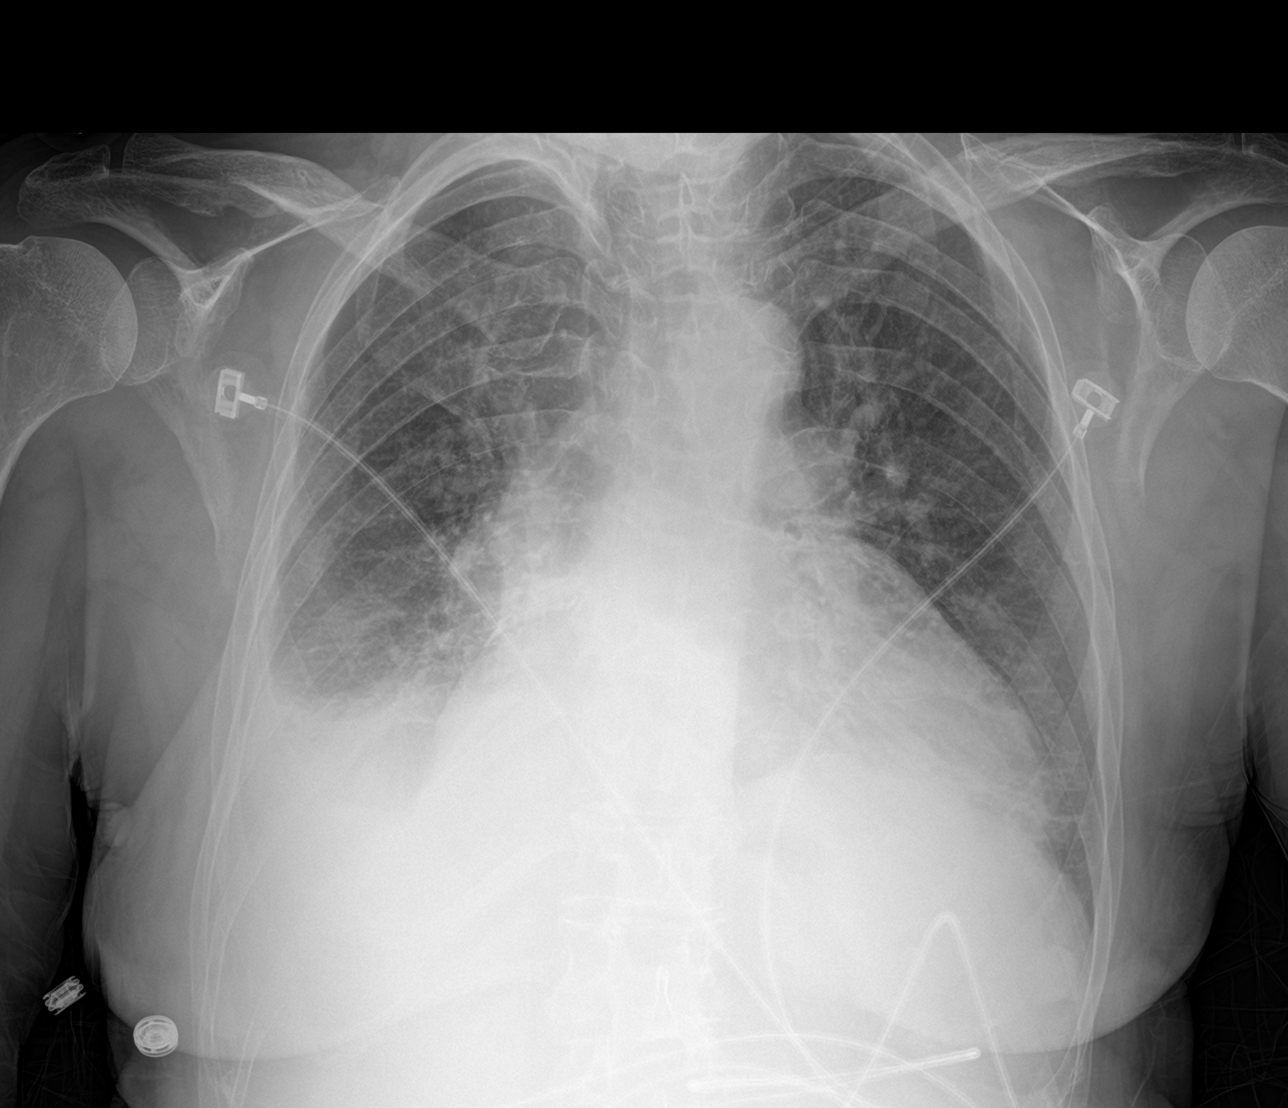

[3 of 3 positions shown; findings below may reference images not displayed]

FINDINGS: There is no evidence of dilated bowel loops or free intraperitoneal
air. No radiopaque calculi or other significant radiographic
abnormality is seen.

Moderate cardiomegaly stable. Small right pleural effusion and
bibasilar atelectasis show no significant change.
IMPRESSION: Unremarkable bowel gas pattern.

Stable cardiomegaly, small right pleural effusion, and bibasilar
atelectasis.

## 2017-08-31 IMAGING — CT CT HEAD W/O CM
1 series · 16 of 30 positions shown, 20 images · non-contrast
Comparison: None

CLINICAL DATA: Slurred speech.

EXAM:
CT HEAD WITHOUT CONTRAST
TECHNIQUE: Contiguous axial images were obtained from the base of the skull
through the vertex without intravenous contrast.

[Series 2: headtrauma 4.8 h37s · axial · 0.45mm/px · z∈[+114,+277]mm · 16 of 36 slices shown, 20 images]
[im 2/36  brain]
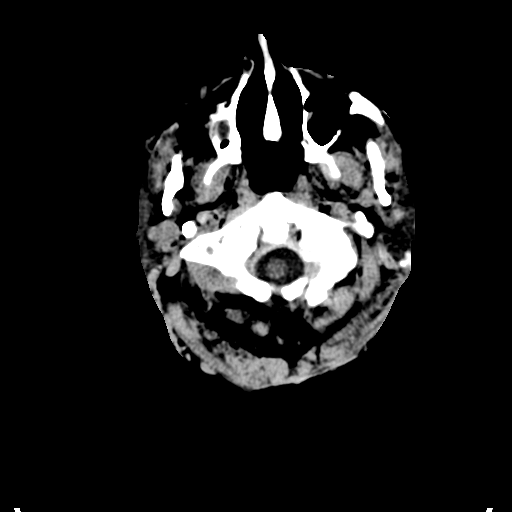
[im 2/36  bone]
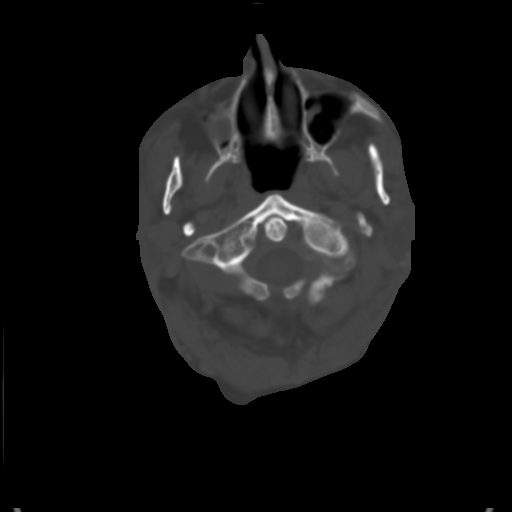
[im 4/36  brain]
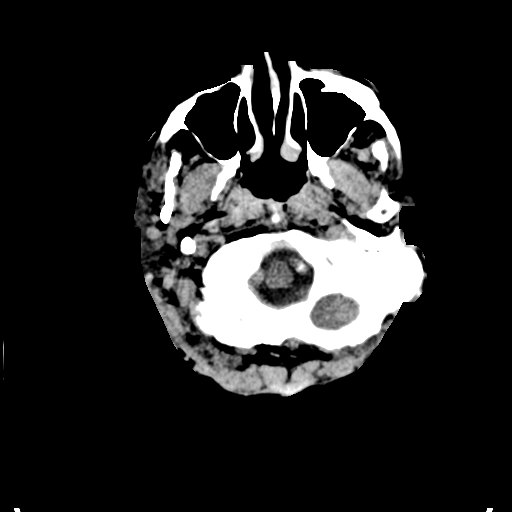
[im 7/36  brain]
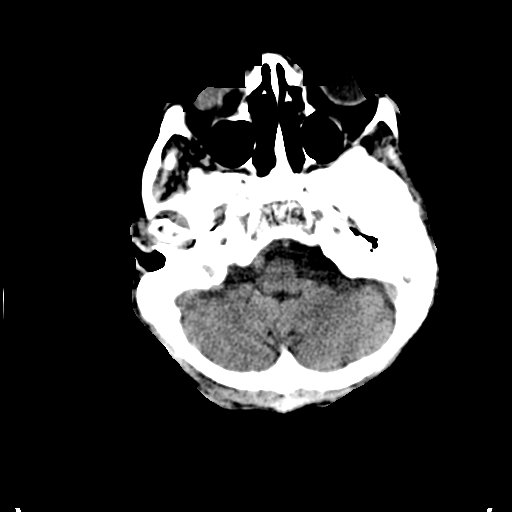
[im 9/36  brain]
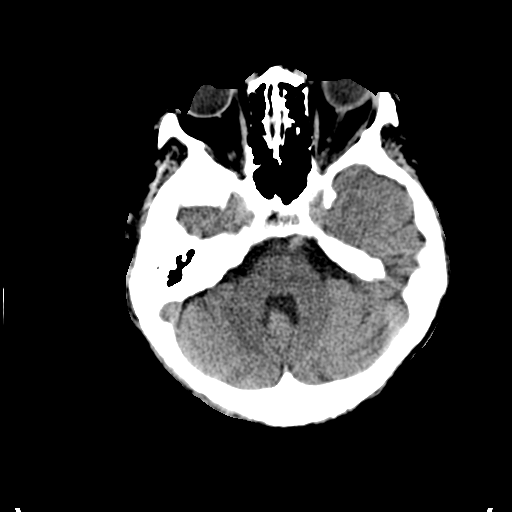
[im 10/36  brain]
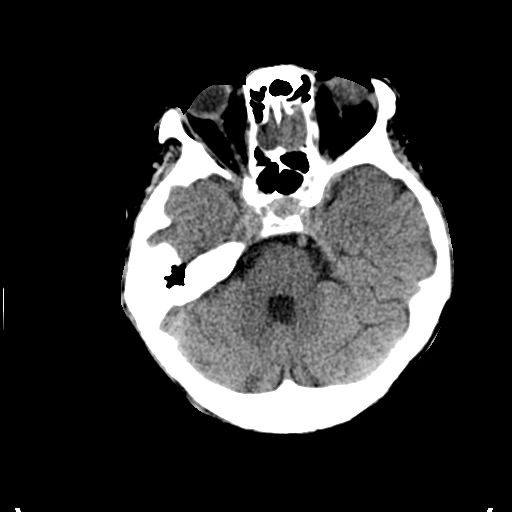
[im 10/36  bone]
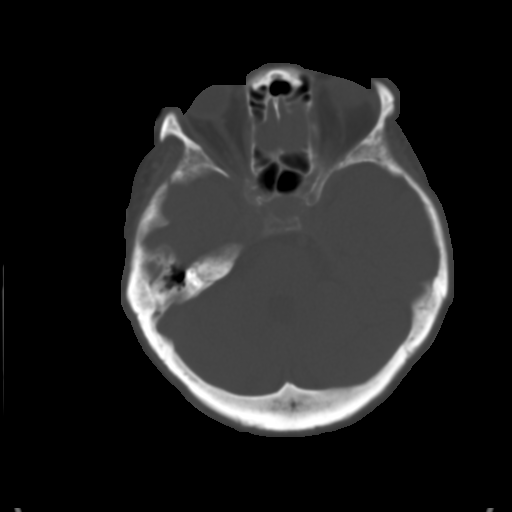
[im 13/36  brain]
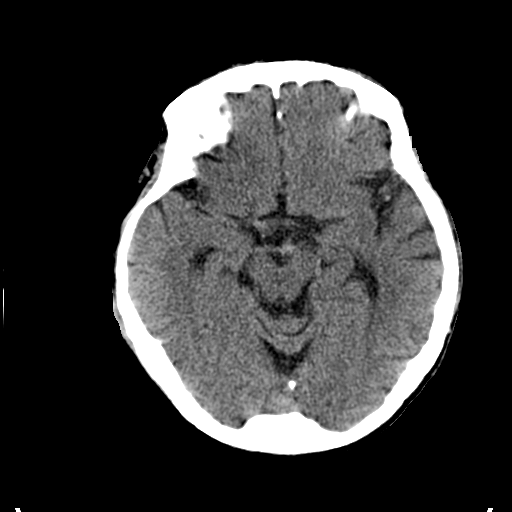
[im 15/36  brain]
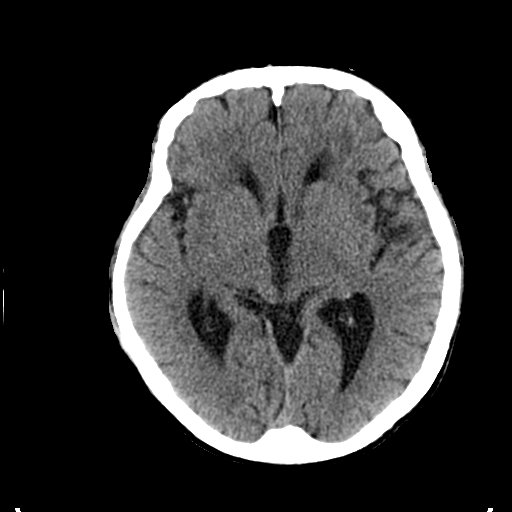
[im 17/36  brain]
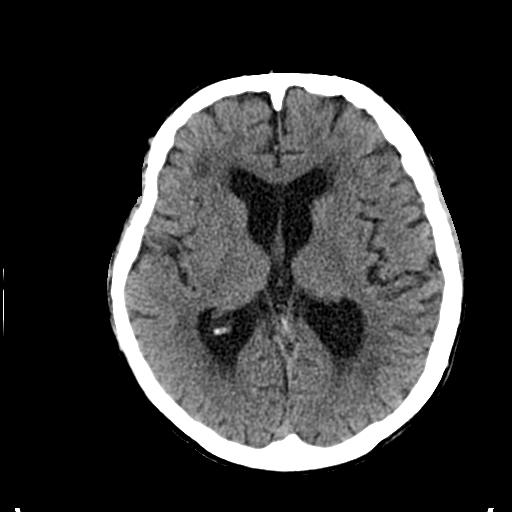
[im 19/36  brain]
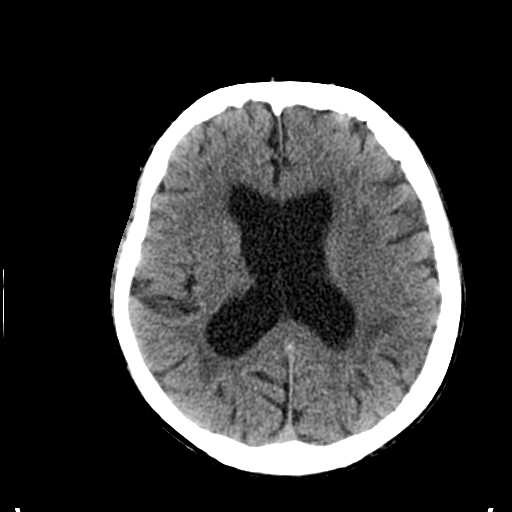
[im 19/36  bone]
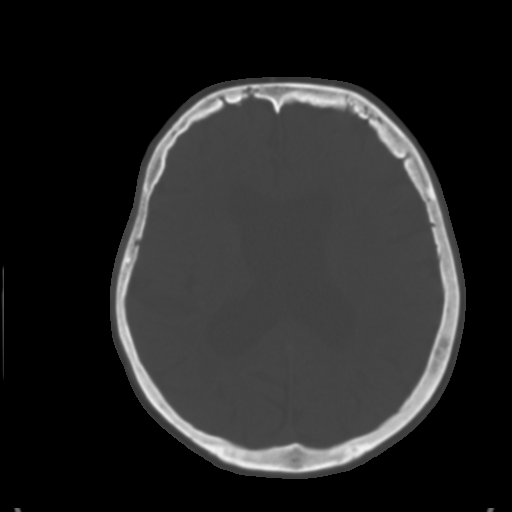
[im 21/36  brain]
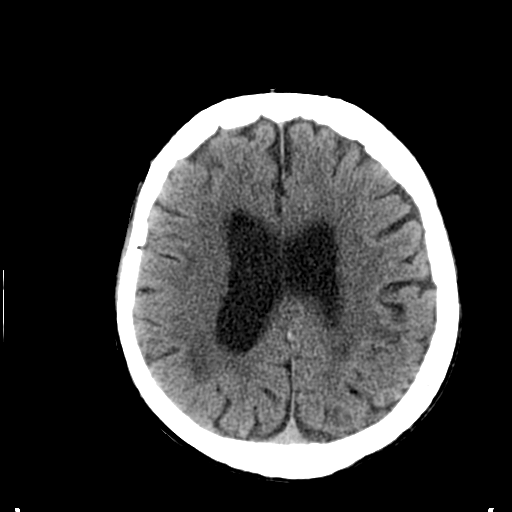
[im 23/36  brain]
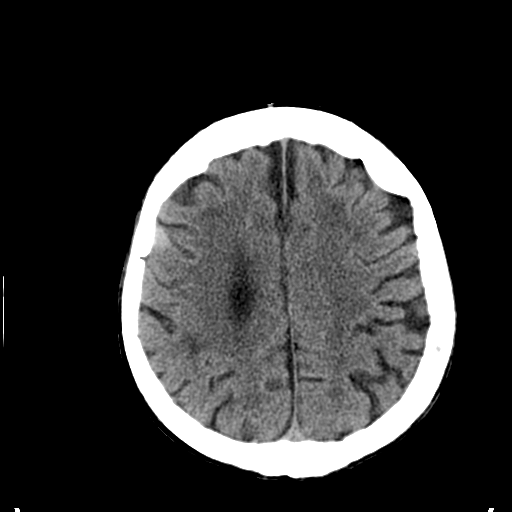
[im 26/36  brain]
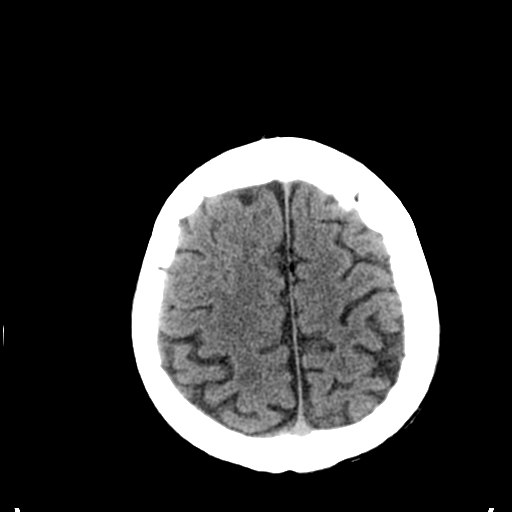
[im 27/36  brain]
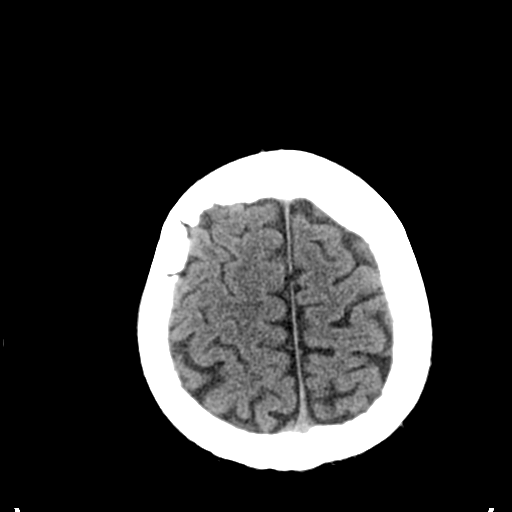
[im 27/36  bone]
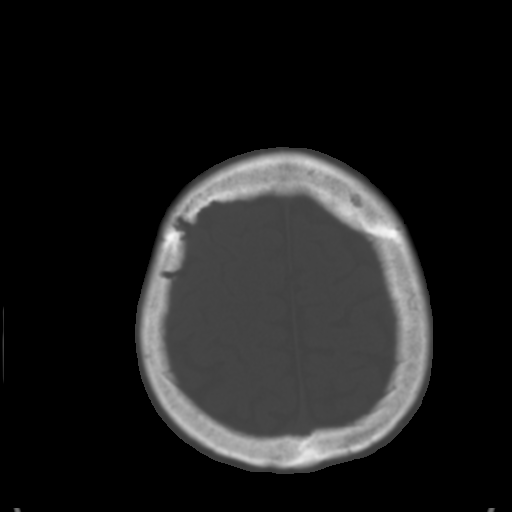
[im 29/36  brain]
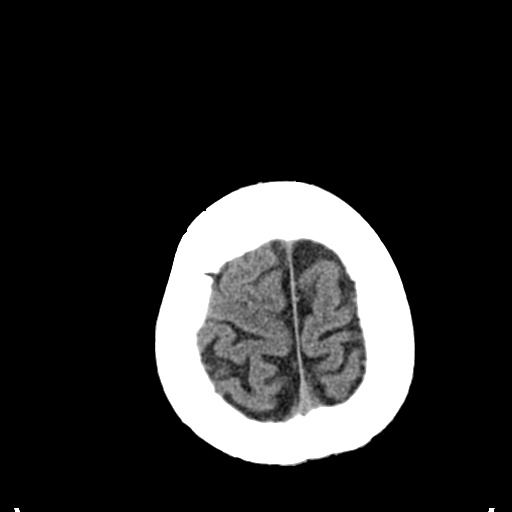
[im 32/36  brain]
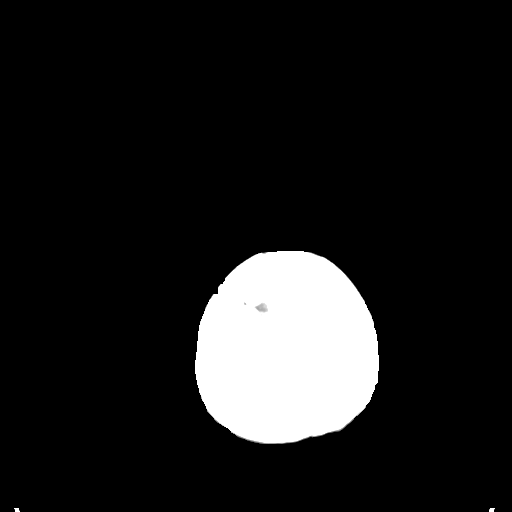
[im 34/36  brain]
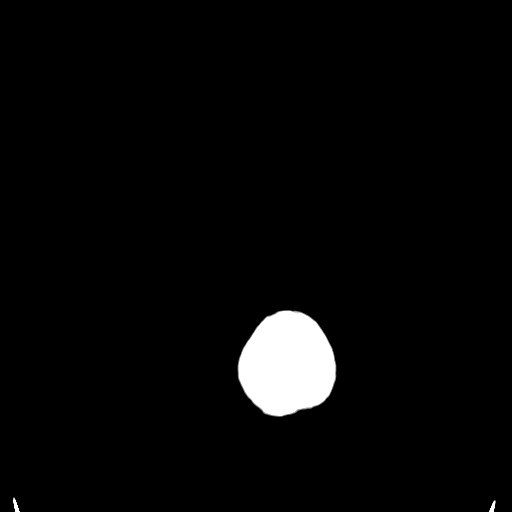

[16 of 30 positions shown; findings below may reference images not displayed]

FINDINGS: There is patchy low attenuation within the subcortical white matter
bilaterally compatible with chronic microvascular disease. There is
no abnormal extra-axial fluid collection, intracranial hemorrhage or
mass identified. No evidence for acute brain infarct. There is mild
scratch set bilateral maxillary sinus disease identified. There is
opacification of the left mastoid air cells. The right mastoid air
cells appear underdeveloped. The calvarium is intact.
IMPRESSION: 1. Small vessel ischemic disease.
2. No acute intracranial abnormalities noted.
3. Left mastoid air cell opacification. Mild bilateral maxillary
sinus disease also noted.

## 2017-09-02 IMAGING — CR DG TIBIA/FIBULA PORT 2V*L*
1 series · 2 of 2 positions shown · non-contrast
Comparison: None.

CLINICAL DATA: Ankle fracture

EXAM:
PORTABLE LEFT TIBIA AND FIBULA - 2 VIEW

[Series 2: ap · 0.17mm/px · 2 of 2 slices shown]
[im 1/2]
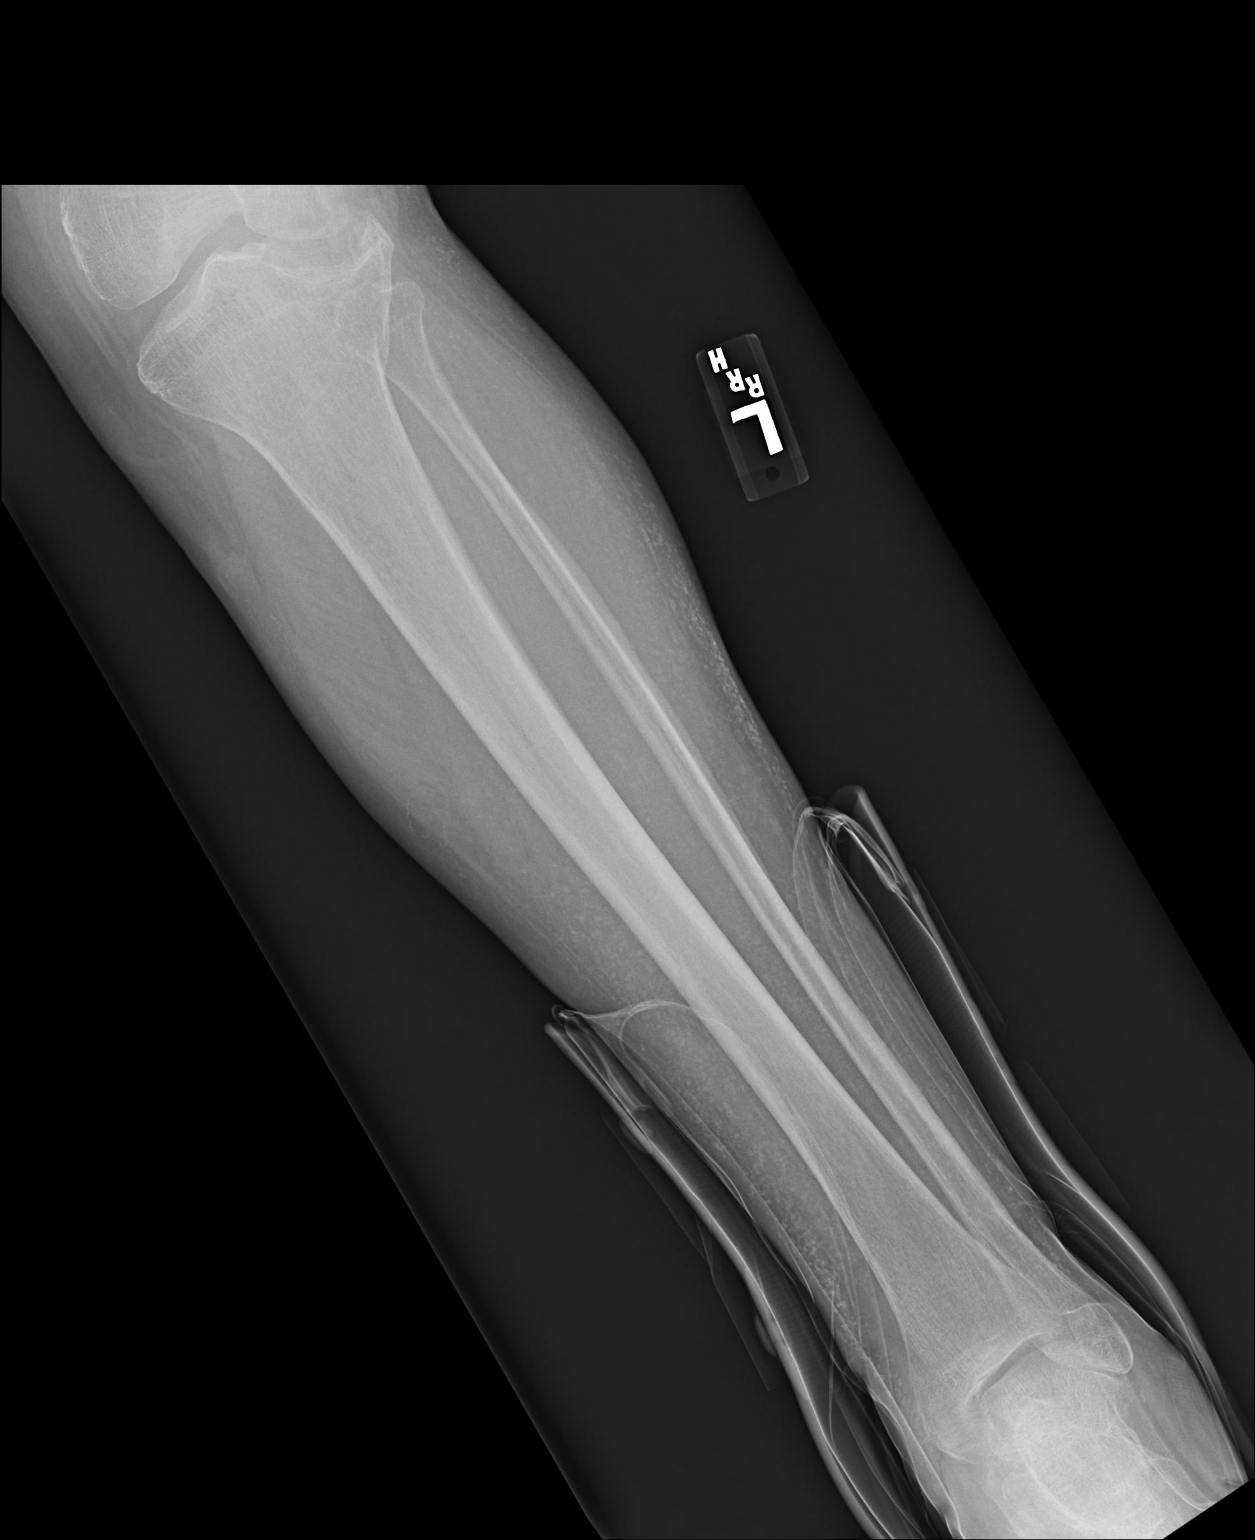
[im 2/2]
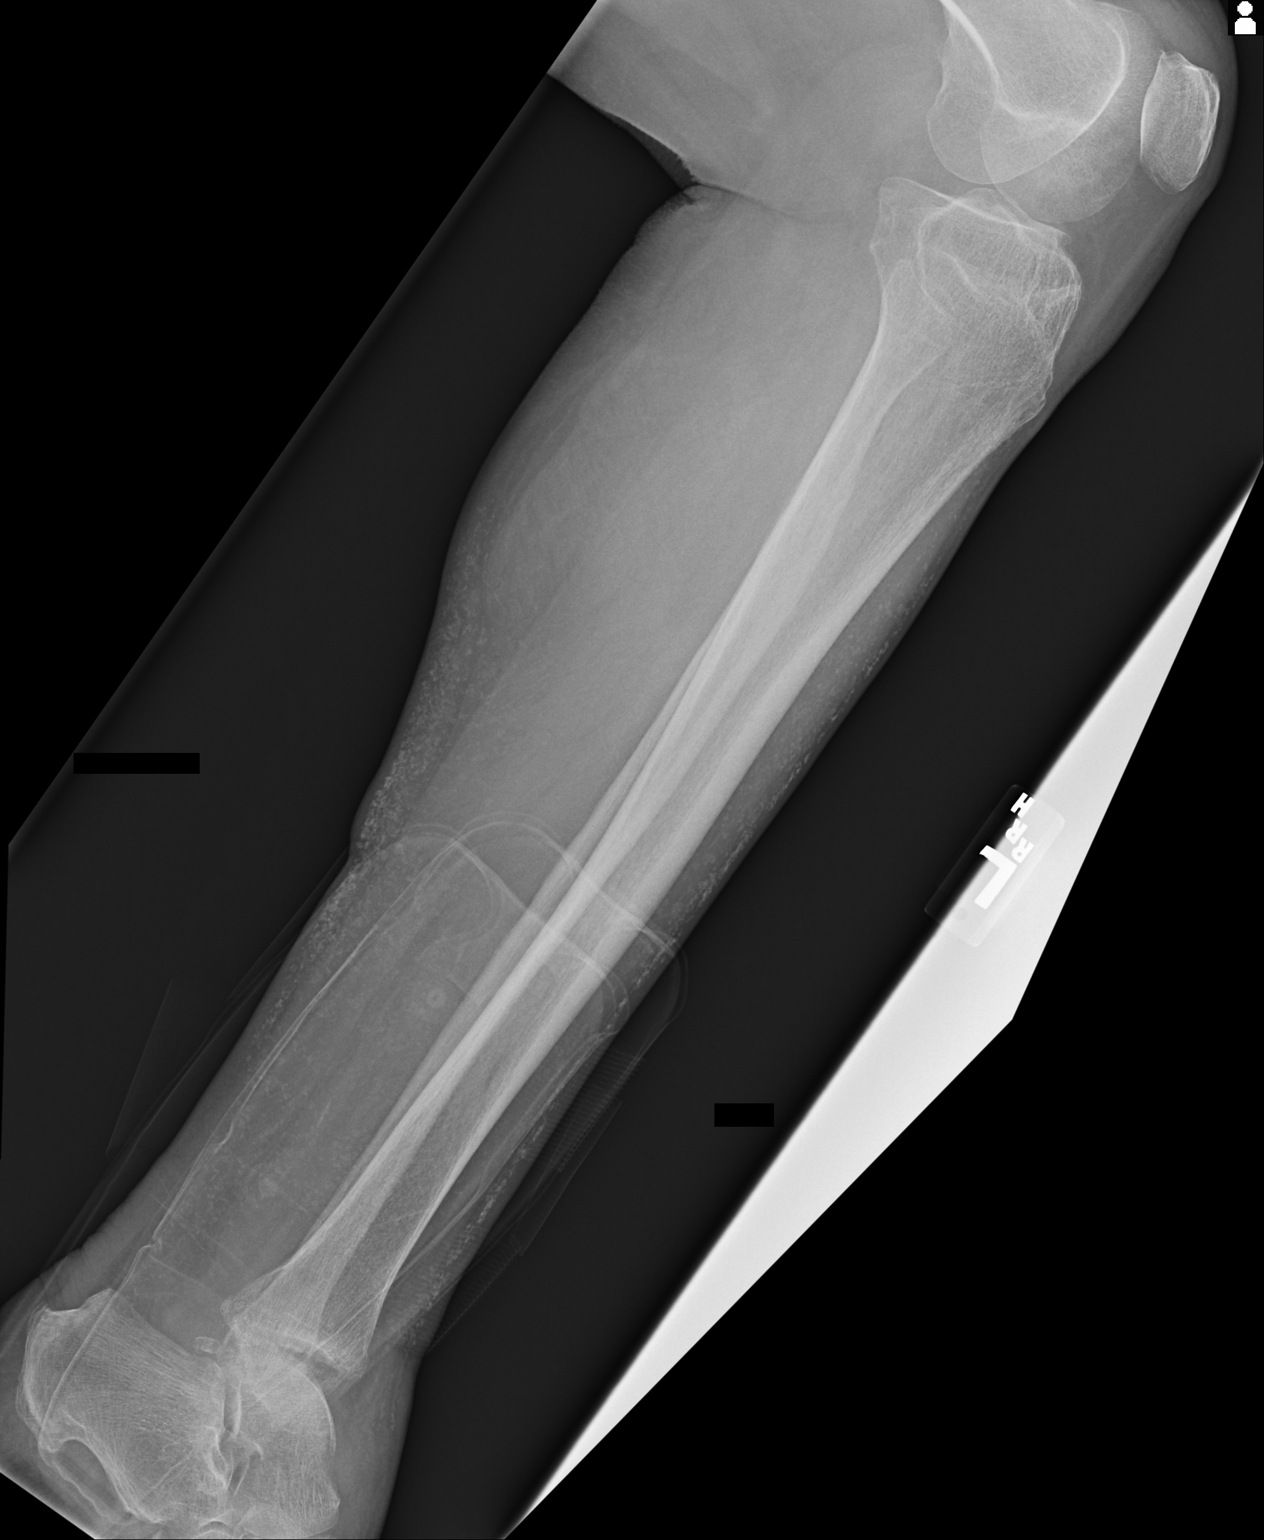

[2 of 2 positions shown; findings below may reference images not displayed]

FINDINGS: Mild diffuse osteopenia. Age in the determinate deformity involving
the ankle is identified. Due to patient positioning exact details of
the deformity is uncertain. Recommend further investigation with CT
of the ankle. Chronic lateral tibial plateau fracture noted.
IMPRESSION: 1. Age-indeterminate deformity involving the right ankle. Recommend
further investigation with CT of the ankle.
2. Chronic lateral tibial plateau fracture.

## 2017-09-03 DIAGNOSIS — E119 Type 2 diabetes mellitus without complications: Secondary | ICD-10-CM | POA: Diagnosis not present

## 2017-09-03 DIAGNOSIS — I482 Chronic atrial fibrillation: Secondary | ICD-10-CM | POA: Diagnosis not present

## 2017-09-03 DIAGNOSIS — R0600 Dyspnea, unspecified: Secondary | ICD-10-CM | POA: Diagnosis not present

## 2017-09-03 DIAGNOSIS — I5042 Chronic combined systolic (congestive) and diastolic (congestive) heart failure: Secondary | ICD-10-CM | POA: Diagnosis not present

## 2017-09-03 DIAGNOSIS — I671 Cerebral aneurysm, nonruptured: Secondary | ICD-10-CM | POA: Diagnosis not present

## 2017-09-03 DIAGNOSIS — I10 Essential (primary) hypertension: Secondary | ICD-10-CM | POA: Diagnosis not present

## 2017-09-03 DIAGNOSIS — R52 Pain, unspecified: Secondary | ICD-10-CM | POA: Diagnosis not present

## 2017-09-03 DIAGNOSIS — E559 Vitamin D deficiency, unspecified: Secondary | ICD-10-CM | POA: Diagnosis not present

## 2017-09-03 DIAGNOSIS — R634 Abnormal weight loss: Secondary | ICD-10-CM | POA: Diagnosis not present

## 2017-09-03 DIAGNOSIS — M6281 Muscle weakness (generalized): Secondary | ICD-10-CM | POA: Diagnosis not present

## 2017-09-03 DIAGNOSIS — M810 Age-related osteoporosis without current pathological fracture: Secondary | ICD-10-CM | POA: Diagnosis not present

## 2017-09-03 DIAGNOSIS — B372 Candidiasis of skin and nail: Secondary | ICD-10-CM | POA: Diagnosis not present

## 2017-09-08 DIAGNOSIS — R52 Pain, unspecified: Secondary | ICD-10-CM | POA: Diagnosis not present

## 2017-09-08 DIAGNOSIS — I10 Essential (primary) hypertension: Secondary | ICD-10-CM | POA: Diagnosis not present

## 2017-09-08 DIAGNOSIS — E119 Type 2 diabetes mellitus without complications: Secondary | ICD-10-CM | POA: Diagnosis not present

## 2017-09-08 DIAGNOSIS — R451 Restlessness and agitation: Secondary | ICD-10-CM | POA: Diagnosis not present

## 2017-09-08 DIAGNOSIS — E559 Vitamin D deficiency, unspecified: Secondary | ICD-10-CM | POA: Diagnosis not present

## 2017-09-08 DIAGNOSIS — M6281 Muscle weakness (generalized): Secondary | ICD-10-CM | POA: Diagnosis not present

## 2017-09-08 DIAGNOSIS — I482 Chronic atrial fibrillation: Secondary | ICD-10-CM | POA: Diagnosis not present

## 2017-09-08 DIAGNOSIS — B372 Candidiasis of skin and nail: Secondary | ICD-10-CM | POA: Diagnosis not present

## 2017-09-08 DIAGNOSIS — I671 Cerebral aneurysm, nonruptured: Secondary | ICD-10-CM | POA: Diagnosis not present

## 2017-09-08 DIAGNOSIS — I5042 Chronic combined systolic (congestive) and diastolic (congestive) heart failure: Secondary | ICD-10-CM | POA: Diagnosis not present

## 2017-09-08 DIAGNOSIS — M810 Age-related osteoporosis without current pathological fracture: Secondary | ICD-10-CM | POA: Diagnosis not present

## 2017-09-13 DIAGNOSIS — I420 Dilated cardiomyopathy: Secondary | ICD-10-CM | POA: Diagnosis not present

## 2017-09-13 DIAGNOSIS — I482 Chronic atrial fibrillation: Secondary | ICD-10-CM | POA: Diagnosis present

## 2017-09-13 DIAGNOSIS — N390 Urinary tract infection, site not specified: Secondary | ICD-10-CM | POA: Diagnosis not present

## 2017-09-13 DIAGNOSIS — R110 Nausea: Secondary | ICD-10-CM | POA: Diagnosis not present

## 2017-09-13 DIAGNOSIS — R279 Unspecified lack of coordination: Secondary | ICD-10-CM | POA: Diagnosis not present

## 2017-09-13 DIAGNOSIS — K219 Gastro-esophageal reflux disease without esophagitis: Secondary | ICD-10-CM | POA: Diagnosis present

## 2017-09-13 DIAGNOSIS — G459 Transient cerebral ischemic attack, unspecified: Secondary | ICD-10-CM | POA: Diagnosis not present

## 2017-09-13 DIAGNOSIS — E559 Vitamin D deficiency, unspecified: Secondary | ICD-10-CM | POA: Diagnosis present

## 2017-09-13 DIAGNOSIS — Z7901 Long term (current) use of anticoagulants: Secondary | ICD-10-CM | POA: Diagnosis not present

## 2017-09-13 DIAGNOSIS — R0600 Dyspnea, unspecified: Secondary | ICD-10-CM | POA: Diagnosis not present

## 2017-09-13 DIAGNOSIS — I5021 Acute systolic (congestive) heart failure: Secondary | ICD-10-CM | POA: Diagnosis not present

## 2017-09-13 DIAGNOSIS — I110 Hypertensive heart disease with heart failure: Secondary | ICD-10-CM | POA: Diagnosis not present

## 2017-09-13 DIAGNOSIS — E119 Type 2 diabetes mellitus without complications: Secondary | ICD-10-CM | POA: Diagnosis present

## 2017-09-13 DIAGNOSIS — J189 Pneumonia, unspecified organism: Secondary | ICD-10-CM | POA: Diagnosis not present

## 2017-09-13 DIAGNOSIS — R0602 Shortness of breath: Secondary | ICD-10-CM | POA: Diagnosis not present

## 2017-09-13 DIAGNOSIS — Q909 Down syndrome, unspecified: Secondary | ICD-10-CM | POA: Diagnosis not present

## 2017-09-13 DIAGNOSIS — Z23 Encounter for immunization: Secondary | ICD-10-CM | POA: Diagnosis not present

## 2017-09-13 DIAGNOSIS — M6281 Muscle weakness (generalized): Secondary | ICD-10-CM | POA: Diagnosis not present

## 2017-09-13 DIAGNOSIS — Z79899 Other long term (current) drug therapy: Secondary | ICD-10-CM | POA: Diagnosis not present

## 2017-09-13 DIAGNOSIS — A419 Sepsis, unspecified organism: Secondary | ICD-10-CM | POA: Diagnosis not present

## 2017-09-13 DIAGNOSIS — R0789 Other chest pain: Secondary | ICD-10-CM | POA: Diagnosis not present

## 2017-09-13 DIAGNOSIS — R1110 Vomiting, unspecified: Secondary | ICD-10-CM | POA: Diagnosis not present

## 2017-09-13 DIAGNOSIS — M7989 Other specified soft tissue disorders: Secondary | ICD-10-CM | POA: Diagnosis not present

## 2017-09-13 DIAGNOSIS — R262 Difficulty in walking, not elsewhere classified: Secondary | ICD-10-CM | POA: Diagnosis not present

## 2017-09-13 DIAGNOSIS — R0689 Other abnormalities of breathing: Secondary | ICD-10-CM | POA: Diagnosis not present

## 2017-09-13 DIAGNOSIS — J811 Chronic pulmonary edema: Secondary | ICD-10-CM | POA: Diagnosis not present

## 2017-09-13 DIAGNOSIS — I5023 Acute on chronic systolic (congestive) heart failure: Secondary | ICD-10-CM | POA: Diagnosis not present

## 2017-09-13 DIAGNOSIS — M810 Age-related osteoporosis without current pathological fracture: Secondary | ICD-10-CM | POA: Diagnosis not present

## 2017-09-13 DIAGNOSIS — Z8673 Personal history of transient ischemic attack (TIA), and cerebral infarction without residual deficits: Secondary | ICD-10-CM | POA: Diagnosis not present

## 2017-09-13 DIAGNOSIS — R0902 Hypoxemia: Secondary | ICD-10-CM | POA: Diagnosis not present

## 2017-09-13 DIAGNOSIS — G4700 Insomnia, unspecified: Secondary | ICD-10-CM | POA: Diagnosis not present

## 2017-09-13 DIAGNOSIS — J159 Unspecified bacterial pneumonia: Secondary | ICD-10-CM | POA: Diagnosis not present

## 2017-09-13 DIAGNOSIS — I509 Heart failure, unspecified: Secondary | ICD-10-CM | POA: Diagnosis not present

## 2017-09-13 DIAGNOSIS — H9190 Unspecified hearing loss, unspecified ear: Secondary | ICD-10-CM | POA: Diagnosis present

## 2017-09-13 DIAGNOSIS — Z801 Family history of malignant neoplasm of trachea, bronchus and lung: Secondary | ICD-10-CM | POA: Diagnosis not present

## 2017-09-13 DIAGNOSIS — R32 Unspecified urinary incontinence: Secondary | ICD-10-CM | POA: Diagnosis present

## 2017-09-13 DIAGNOSIS — I499 Cardiac arrhythmia, unspecified: Secondary | ICD-10-CM | POA: Diagnosis not present

## 2017-09-13 DIAGNOSIS — I5033 Acute on chronic diastolic (congestive) heart failure: Secondary | ICD-10-CM | POA: Diagnosis not present

## 2017-09-13 DIAGNOSIS — I671 Cerebral aneurysm, nonruptured: Secondary | ICD-10-CM | POA: Diagnosis not present

## 2017-09-13 DIAGNOSIS — J918 Pleural effusion in other conditions classified elsewhere: Secondary | ICD-10-CM | POA: Diagnosis present

## 2017-09-13 DIAGNOSIS — I5042 Chronic combined systolic (congestive) and diastolic (congestive) heart failure: Secondary | ICD-10-CM | POA: Diagnosis not present

## 2017-09-13 DIAGNOSIS — J90 Pleural effusion, not elsewhere classified: Secondary | ICD-10-CM | POA: Diagnosis not present

## 2017-09-13 DIAGNOSIS — J810 Acute pulmonary edema: Secondary | ICD-10-CM | POA: Diagnosis not present

## 2017-09-13 DIAGNOSIS — I272 Pulmonary hypertension, unspecified: Secondary | ICD-10-CM | POA: Diagnosis present

## 2017-09-13 DIAGNOSIS — R1312 Dysphagia, oropharyngeal phase: Secondary | ICD-10-CM | POA: Diagnosis not present

## 2017-09-13 DIAGNOSIS — I999 Unspecified disorder of circulatory system: Secondary | ICD-10-CM | POA: Diagnosis not present

## 2017-09-13 DIAGNOSIS — I10 Essential (primary) hypertension: Secondary | ICD-10-CM | POA: Diagnosis not present

## 2017-09-22 DIAGNOSIS — M810 Age-related osteoporosis without current pathological fracture: Secondary | ICD-10-CM | POA: Diagnosis not present

## 2017-09-22 DIAGNOSIS — T85698A Other mechanical complication of other specified internal prosthetic devices, implants and grafts, initial encounter: Secondary | ICD-10-CM | POA: Diagnosis not present

## 2017-09-22 DIAGNOSIS — E871 Hypo-osmolality and hyponatremia: Secondary | ICD-10-CM | POA: Diagnosis not present

## 2017-09-22 DIAGNOSIS — I671 Cerebral aneurysm, nonruptured: Secondary | ICD-10-CM | POA: Diagnosis not present

## 2017-09-22 DIAGNOSIS — F5109 Other insomnia not due to a substance or known physiological condition: Secondary | ICD-10-CM | POA: Diagnosis not present

## 2017-09-22 DIAGNOSIS — G894 Chronic pain syndrome: Secondary | ICD-10-CM | POA: Diagnosis not present

## 2017-09-22 DIAGNOSIS — E119 Type 2 diabetes mellitus without complications: Secondary | ICD-10-CM | POA: Diagnosis not present

## 2017-09-22 DIAGNOSIS — I739 Peripheral vascular disease, unspecified: Secondary | ICD-10-CM | POA: Diagnosis not present

## 2017-09-22 DIAGNOSIS — J810 Acute pulmonary edema: Secondary | ICD-10-CM | POA: Diagnosis not present

## 2017-09-22 DIAGNOSIS — R1312 Dysphagia, oropharyngeal phase: Secondary | ICD-10-CM | POA: Diagnosis not present

## 2017-09-22 DIAGNOSIS — R451 Restlessness and agitation: Secondary | ICD-10-CM | POA: Diagnosis not present

## 2017-09-22 DIAGNOSIS — J90 Pleural effusion, not elsewhere classified: Secondary | ICD-10-CM | POA: Diagnosis not present

## 2017-09-22 DIAGNOSIS — B351 Tinea unguium: Secondary | ICD-10-CM | POA: Diagnosis not present

## 2017-09-22 DIAGNOSIS — T85618A Breakdown (mechanical) of other specified internal prosthetic devices, implants and grafts, initial encounter: Secondary | ICD-10-CM | POA: Diagnosis not present

## 2017-09-22 DIAGNOSIS — Q845 Enlarged and hypertrophic nails: Secondary | ICD-10-CM | POA: Diagnosis not present

## 2017-09-22 DIAGNOSIS — A419 Sepsis, unspecified organism: Secondary | ICD-10-CM | POA: Diagnosis not present

## 2017-09-22 DIAGNOSIS — Q909 Down syndrome, unspecified: Secondary | ICD-10-CM | POA: Diagnosis not present

## 2017-09-22 DIAGNOSIS — R279 Unspecified lack of coordination: Secondary | ICD-10-CM | POA: Diagnosis not present

## 2017-09-22 DIAGNOSIS — F819 Developmental disorder of scholastic skills, unspecified: Secondary | ICD-10-CM | POA: Diagnosis not present

## 2017-09-22 DIAGNOSIS — M6281 Muscle weakness (generalized): Secondary | ICD-10-CM | POA: Diagnosis not present

## 2017-09-22 DIAGNOSIS — Z7901 Long term (current) use of anticoagulants: Secondary | ICD-10-CM | POA: Diagnosis not present

## 2017-09-22 DIAGNOSIS — I5023 Acute on chronic systolic (congestive) heart failure: Secondary | ICD-10-CM | POA: Diagnosis not present

## 2017-09-22 DIAGNOSIS — R262 Difficulty in walking, not elsewhere classified: Secondary | ICD-10-CM | POA: Diagnosis not present

## 2017-09-22 DIAGNOSIS — I4891 Unspecified atrial fibrillation: Secondary | ICD-10-CM | POA: Diagnosis not present

## 2017-09-22 DIAGNOSIS — G459 Transient cerebral ischemic attack, unspecified: Secondary | ICD-10-CM | POA: Diagnosis not present

## 2017-09-22 DIAGNOSIS — J159 Unspecified bacterial pneumonia: Secondary | ICD-10-CM | POA: Diagnosis not present

## 2017-09-22 DIAGNOSIS — J811 Chronic pulmonary edema: Secondary | ICD-10-CM | POA: Diagnosis not present

## 2017-09-22 DIAGNOSIS — J918 Pleural effusion in other conditions classified elsewhere: Secondary | ICD-10-CM | POA: Diagnosis not present

## 2017-09-22 DIAGNOSIS — G4700 Insomnia, unspecified: Secondary | ICD-10-CM | POA: Diagnosis not present

## 2017-09-22 DIAGNOSIS — I482 Chronic atrial fibrillation: Secondary | ICD-10-CM | POA: Diagnosis not present

## 2017-09-22 DIAGNOSIS — J9600 Acute respiratory failure, unspecified whether with hypoxia or hypercapnia: Secondary | ICD-10-CM | POA: Diagnosis not present

## 2017-09-22 DIAGNOSIS — F0281 Dementia in other diseases classified elsewhere with behavioral disturbance: Secondary | ICD-10-CM | POA: Diagnosis not present

## 2017-09-22 DIAGNOSIS — R0602 Shortness of breath: Secondary | ICD-10-CM | POA: Diagnosis not present

## 2017-09-22 DIAGNOSIS — N390 Urinary tract infection, site not specified: Secondary | ICD-10-CM | POA: Diagnosis not present

## 2017-09-22 DIAGNOSIS — I10 Essential (primary) hypertension: Secondary | ICD-10-CM | POA: Diagnosis not present

## 2017-09-22 DIAGNOSIS — T83498A Other mechanical complication of other prosthetic devices, implants and grafts of genital tract, initial encounter: Secondary | ICD-10-CM | POA: Diagnosis not present

## 2017-09-22 DIAGNOSIS — I5042 Chronic combined systolic (congestive) and diastolic (congestive) heart failure: Secondary | ICD-10-CM | POA: Diagnosis not present

## 2017-09-22 DIAGNOSIS — Z23 Encounter for immunization: Secondary | ICD-10-CM | POA: Diagnosis not present

## 2017-09-22 DIAGNOSIS — L603 Nail dystrophy: Secondary | ICD-10-CM | POA: Diagnosis not present

## 2017-09-22 DIAGNOSIS — A4189 Other specified sepsis: Secondary | ICD-10-CM | POA: Diagnosis not present

## 2017-09-22 DIAGNOSIS — E1159 Type 2 diabetes mellitus with other circulatory complications: Secondary | ICD-10-CM | POA: Diagnosis not present

## 2017-09-22 DIAGNOSIS — T82898A Other specified complication of vascular prosthetic devices, implants and grafts, initial encounter: Secondary | ICD-10-CM | POA: Diagnosis not present

## 2017-09-22 DIAGNOSIS — Z452 Encounter for adjustment and management of vascular access device: Secondary | ICD-10-CM | POA: Diagnosis not present

## 2017-09-22 DIAGNOSIS — Z79899 Other long term (current) drug therapy: Secondary | ICD-10-CM | POA: Diagnosis not present

## 2017-09-22 DIAGNOSIS — E559 Vitamin D deficiency, unspecified: Secondary | ICD-10-CM | POA: Diagnosis not present

## 2017-09-22 DIAGNOSIS — I5021 Acute systolic (congestive) heart failure: Secondary | ICD-10-CM | POA: Diagnosis not present

## 2017-09-24 DIAGNOSIS — M810 Age-related osteoporosis without current pathological fracture: Secondary | ICD-10-CM | POA: Diagnosis not present

## 2017-09-24 DIAGNOSIS — A4189 Other specified sepsis: Secondary | ICD-10-CM | POA: Diagnosis not present

## 2017-09-24 DIAGNOSIS — J9600 Acute respiratory failure, unspecified whether with hypoxia or hypercapnia: Secondary | ICD-10-CM | POA: Diagnosis not present

## 2017-09-24 DIAGNOSIS — J810 Acute pulmonary edema: Secondary | ICD-10-CM | POA: Diagnosis not present

## 2017-09-24 DIAGNOSIS — M6281 Muscle weakness (generalized): Secondary | ICD-10-CM | POA: Diagnosis not present

## 2017-09-24 DIAGNOSIS — I5042 Chronic combined systolic (congestive) and diastolic (congestive) heart failure: Secondary | ICD-10-CM | POA: Diagnosis not present

## 2017-09-24 DIAGNOSIS — I10 Essential (primary) hypertension: Secondary | ICD-10-CM | POA: Diagnosis not present

## 2017-09-24 DIAGNOSIS — N390 Urinary tract infection, site not specified: Secondary | ICD-10-CM | POA: Diagnosis not present

## 2017-09-24 DIAGNOSIS — I482 Chronic atrial fibrillation: Secondary | ICD-10-CM | POA: Diagnosis not present

## 2017-09-24 DIAGNOSIS — G894 Chronic pain syndrome: Secondary | ICD-10-CM | POA: Diagnosis not present

## 2017-09-24 DIAGNOSIS — I4891 Unspecified atrial fibrillation: Secondary | ICD-10-CM | POA: Diagnosis not present

## 2017-09-24 DIAGNOSIS — J918 Pleural effusion in other conditions classified elsewhere: Secondary | ICD-10-CM | POA: Diagnosis not present

## 2017-09-27 DIAGNOSIS — G894 Chronic pain syndrome: Secondary | ICD-10-CM | POA: Diagnosis not present

## 2017-09-27 DIAGNOSIS — J810 Acute pulmonary edema: Secondary | ICD-10-CM | POA: Diagnosis not present

## 2017-09-27 DIAGNOSIS — J918 Pleural effusion in other conditions classified elsewhere: Secondary | ICD-10-CM | POA: Diagnosis not present

## 2017-09-27 DIAGNOSIS — N390 Urinary tract infection, site not specified: Secondary | ICD-10-CM | POA: Diagnosis not present

## 2017-09-27 DIAGNOSIS — M6281 Muscle weakness (generalized): Secondary | ICD-10-CM | POA: Diagnosis not present

## 2017-09-27 DIAGNOSIS — M810 Age-related osteoporosis without current pathological fracture: Secondary | ICD-10-CM | POA: Diagnosis not present

## 2017-09-27 DIAGNOSIS — A4189 Other specified sepsis: Secondary | ICD-10-CM | POA: Diagnosis not present

## 2017-09-27 DIAGNOSIS — R451 Restlessness and agitation: Secondary | ICD-10-CM | POA: Diagnosis not present

## 2017-09-27 DIAGNOSIS — I10 Essential (primary) hypertension: Secondary | ICD-10-CM | POA: Diagnosis not present

## 2017-09-27 DIAGNOSIS — I5042 Chronic combined systolic (congestive) and diastolic (congestive) heart failure: Secondary | ICD-10-CM | POA: Diagnosis not present

## 2017-09-27 DIAGNOSIS — J9600 Acute respiratory failure, unspecified whether with hypoxia or hypercapnia: Secondary | ICD-10-CM | POA: Diagnosis not present

## 2017-09-27 DIAGNOSIS — I4891 Unspecified atrial fibrillation: Secondary | ICD-10-CM | POA: Diagnosis not present

## 2017-09-29 DIAGNOSIS — J918 Pleural effusion in other conditions classified elsewhere: Secondary | ICD-10-CM | POA: Diagnosis not present

## 2017-09-29 DIAGNOSIS — I5042 Chronic combined systolic (congestive) and diastolic (congestive) heart failure: Secondary | ICD-10-CM | POA: Diagnosis not present

## 2017-09-29 DIAGNOSIS — J9600 Acute respiratory failure, unspecified whether with hypoxia or hypercapnia: Secondary | ICD-10-CM | POA: Diagnosis not present

## 2017-09-29 DIAGNOSIS — R451 Restlessness and agitation: Secondary | ICD-10-CM | POA: Diagnosis not present

## 2017-09-29 DIAGNOSIS — J810 Acute pulmonary edema: Secondary | ICD-10-CM | POA: Diagnosis not present

## 2017-09-29 DIAGNOSIS — M810 Age-related osteoporosis without current pathological fracture: Secondary | ICD-10-CM | POA: Diagnosis not present

## 2017-09-29 DIAGNOSIS — N390 Urinary tract infection, site not specified: Secondary | ICD-10-CM | POA: Diagnosis not present

## 2017-09-29 DIAGNOSIS — M6281 Muscle weakness (generalized): Secondary | ICD-10-CM | POA: Diagnosis not present

## 2017-09-29 DIAGNOSIS — I4891 Unspecified atrial fibrillation: Secondary | ICD-10-CM | POA: Diagnosis not present

## 2017-09-29 DIAGNOSIS — G894 Chronic pain syndrome: Secondary | ICD-10-CM | POA: Diagnosis not present

## 2017-09-29 DIAGNOSIS — I10 Essential (primary) hypertension: Secondary | ICD-10-CM | POA: Diagnosis not present

## 2017-09-29 DIAGNOSIS — A4189 Other specified sepsis: Secondary | ICD-10-CM | POA: Diagnosis not present

## 2017-10-01 DIAGNOSIS — N390 Urinary tract infection, site not specified: Secondary | ICD-10-CM | POA: Diagnosis not present

## 2017-10-01 DIAGNOSIS — J90 Pleural effusion, not elsewhere classified: Secondary | ICD-10-CM | POA: Diagnosis not present

## 2017-10-01 DIAGNOSIS — I10 Essential (primary) hypertension: Secondary | ICD-10-CM | POA: Diagnosis not present

## 2017-10-01 DIAGNOSIS — E119 Type 2 diabetes mellitus without complications: Secondary | ICD-10-CM | POA: Diagnosis not present

## 2017-10-01 DIAGNOSIS — I5042 Chronic combined systolic (congestive) and diastolic (congestive) heart failure: Secondary | ICD-10-CM | POA: Diagnosis not present

## 2017-10-01 DIAGNOSIS — M810 Age-related osteoporosis without current pathological fracture: Secondary | ICD-10-CM | POA: Diagnosis not present

## 2017-10-01 DIAGNOSIS — R451 Restlessness and agitation: Secondary | ICD-10-CM | POA: Diagnosis not present

## 2017-10-01 DIAGNOSIS — A4189 Other specified sepsis: Secondary | ICD-10-CM | POA: Diagnosis not present

## 2017-10-01 DIAGNOSIS — I5023 Acute on chronic systolic (congestive) heart failure: Secondary | ICD-10-CM | POA: Diagnosis not present

## 2017-10-01 DIAGNOSIS — J918 Pleural effusion in other conditions classified elsewhere: Secondary | ICD-10-CM | POA: Diagnosis not present

## 2017-10-01 DIAGNOSIS — I4891 Unspecified atrial fibrillation: Secondary | ICD-10-CM | POA: Diagnosis not present

## 2017-10-01 DIAGNOSIS — G894 Chronic pain syndrome: Secondary | ICD-10-CM | POA: Diagnosis not present

## 2017-10-01 DIAGNOSIS — F819 Developmental disorder of scholastic skills, unspecified: Secondary | ICD-10-CM | POA: Diagnosis not present

## 2017-10-01 DIAGNOSIS — M6281 Muscle weakness (generalized): Secondary | ICD-10-CM | POA: Diagnosis not present

## 2017-10-04 DIAGNOSIS — I4891 Unspecified atrial fibrillation: Secondary | ICD-10-CM | POA: Diagnosis not present

## 2017-10-04 DIAGNOSIS — N390 Urinary tract infection, site not specified: Secondary | ICD-10-CM | POA: Diagnosis not present

## 2017-10-04 DIAGNOSIS — A4189 Other specified sepsis: Secondary | ICD-10-CM | POA: Diagnosis not present

## 2017-10-04 DIAGNOSIS — I10 Essential (primary) hypertension: Secondary | ICD-10-CM | POA: Diagnosis not present

## 2017-10-04 DIAGNOSIS — I5042 Chronic combined systolic (congestive) and diastolic (congestive) heart failure: Secondary | ICD-10-CM | POA: Diagnosis not present

## 2017-10-04 DIAGNOSIS — J9600 Acute respiratory failure, unspecified whether with hypoxia or hypercapnia: Secondary | ICD-10-CM | POA: Diagnosis not present

## 2017-10-04 DIAGNOSIS — J810 Acute pulmonary edema: Secondary | ICD-10-CM | POA: Diagnosis not present

## 2017-10-04 DIAGNOSIS — J918 Pleural effusion in other conditions classified elsewhere: Secondary | ICD-10-CM | POA: Diagnosis not present

## 2017-10-04 DIAGNOSIS — G894 Chronic pain syndrome: Secondary | ICD-10-CM | POA: Diagnosis not present

## 2017-10-08 DIAGNOSIS — A4189 Other specified sepsis: Secondary | ICD-10-CM | POA: Diagnosis not present

## 2017-10-08 DIAGNOSIS — M810 Age-related osteoporosis without current pathological fracture: Secondary | ICD-10-CM | POA: Diagnosis not present

## 2017-10-08 DIAGNOSIS — I10 Essential (primary) hypertension: Secondary | ICD-10-CM | POA: Diagnosis not present

## 2017-10-08 DIAGNOSIS — J918 Pleural effusion in other conditions classified elsewhere: Secondary | ICD-10-CM | POA: Diagnosis not present

## 2017-10-08 DIAGNOSIS — I4891 Unspecified atrial fibrillation: Secondary | ICD-10-CM | POA: Diagnosis not present

## 2017-10-08 DIAGNOSIS — R451 Restlessness and agitation: Secondary | ICD-10-CM | POA: Diagnosis not present

## 2017-10-08 DIAGNOSIS — N390 Urinary tract infection, site not specified: Secondary | ICD-10-CM | POA: Diagnosis not present

## 2017-10-08 DIAGNOSIS — I5023 Acute on chronic systolic (congestive) heart failure: Secondary | ICD-10-CM | POA: Diagnosis not present

## 2017-10-08 DIAGNOSIS — E871 Hypo-osmolality and hyponatremia: Secondary | ICD-10-CM | POA: Diagnosis not present

## 2017-10-08 DIAGNOSIS — G894 Chronic pain syndrome: Secondary | ICD-10-CM | POA: Diagnosis not present

## 2017-10-08 DIAGNOSIS — M6281 Muscle weakness (generalized): Secondary | ICD-10-CM | POA: Diagnosis not present

## 2017-10-12 DIAGNOSIS — Q909 Down syndrome, unspecified: Secondary | ICD-10-CM | POA: Diagnosis not present

## 2017-10-12 DIAGNOSIS — I4891 Unspecified atrial fibrillation: Secondary | ICD-10-CM | POA: Diagnosis not present

## 2017-10-12 DIAGNOSIS — F5109 Other insomnia not due to a substance or known physiological condition: Secondary | ICD-10-CM | POA: Diagnosis not present

## 2017-10-12 DIAGNOSIS — E119 Type 2 diabetes mellitus without complications: Secondary | ICD-10-CM | POA: Diagnosis not present

## 2017-10-12 DIAGNOSIS — F819 Developmental disorder of scholastic skills, unspecified: Secondary | ICD-10-CM | POA: Diagnosis not present

## 2017-10-12 DIAGNOSIS — I5042 Chronic combined systolic (congestive) and diastolic (congestive) heart failure: Secondary | ICD-10-CM | POA: Diagnosis not present

## 2017-10-12 DIAGNOSIS — F0281 Dementia in other diseases classified elsewhere with behavioral disturbance: Secondary | ICD-10-CM | POA: Diagnosis not present

## 2017-10-12 DIAGNOSIS — J90 Pleural effusion, not elsewhere classified: Secondary | ICD-10-CM | POA: Diagnosis not present

## 2017-10-17 DIAGNOSIS — M6281 Muscle weakness (generalized): Secondary | ICD-10-CM | POA: Diagnosis not present

## 2017-10-17 DIAGNOSIS — I671 Cerebral aneurysm, nonruptured: Secondary | ICD-10-CM | POA: Diagnosis not present

## 2017-10-17 DIAGNOSIS — E559 Vitamin D deficiency, unspecified: Secondary | ICD-10-CM | POA: Diagnosis not present

## 2017-10-17 DIAGNOSIS — G459 Transient cerebral ischemic attack, unspecified: Secondary | ICD-10-CM | POA: Diagnosis not present

## 2017-10-17 DIAGNOSIS — Q909 Down syndrome, unspecified: Secondary | ICD-10-CM | POA: Diagnosis not present

## 2017-10-17 DIAGNOSIS — I482 Chronic atrial fibrillation: Secondary | ICD-10-CM | POA: Diagnosis not present

## 2017-10-17 DIAGNOSIS — I4891 Unspecified atrial fibrillation: Secondary | ICD-10-CM | POA: Diagnosis not present

## 2017-10-17 DIAGNOSIS — I5042 Chronic combined systolic (congestive) and diastolic (congestive) heart failure: Secondary | ICD-10-CM | POA: Diagnosis not present

## 2017-10-17 DIAGNOSIS — M810 Age-related osteoporosis without current pathological fracture: Secondary | ICD-10-CM | POA: Diagnosis not present

## 2017-10-17 DIAGNOSIS — E119 Type 2 diabetes mellitus without complications: Secondary | ICD-10-CM | POA: Diagnosis not present

## 2017-10-19 DIAGNOSIS — Z79899 Other long term (current) drug therapy: Secondary | ICD-10-CM | POA: Diagnosis not present

## 2017-10-19 DIAGNOSIS — J90 Pleural effusion, not elsewhere classified: Secondary | ICD-10-CM | POA: Diagnosis not present

## 2017-10-19 DIAGNOSIS — E119 Type 2 diabetes mellitus without complications: Secondary | ICD-10-CM | POA: Diagnosis not present

## 2017-10-19 DIAGNOSIS — T85698A Other mechanical complication of other specified internal prosthetic devices, implants and grafts, initial encounter: Secondary | ICD-10-CM | POA: Diagnosis not present

## 2017-10-19 DIAGNOSIS — Z7901 Long term (current) use of anticoagulants: Secondary | ICD-10-CM | POA: Diagnosis not present

## 2017-10-19 DIAGNOSIS — Z452 Encounter for adjustment and management of vascular access device: Secondary | ICD-10-CM | POA: Diagnosis not present

## 2017-10-19 DIAGNOSIS — T85618A Breakdown (mechanical) of other specified internal prosthetic devices, implants and grafts, initial encounter: Secondary | ICD-10-CM | POA: Diagnosis not present

## 2019-08-24 DEATH — deceased

## 2022-08-22 NOTE — Progress Notes (Signed)
No chief complaint on file.  error
